# Patient Record
Sex: Male | Born: 1960
Health system: Southern US, Community
[De-identification: ages and names within clinical notes are randomized; demographics above are authoritative.]

## PROBLEM LIST (undated history)

## (undated) DIAGNOSIS — I4891 Unspecified atrial fibrillation: Secondary | ICD-10-CM

## (undated) DIAGNOSIS — I10 Essential (primary) hypertension: Secondary | ICD-10-CM

## (undated) DIAGNOSIS — E7800 Pure hypercholesterolemia, unspecified: Secondary | ICD-10-CM

## (undated) HISTORY — DX: Unspecified atrial fibrillation: I48.91

---

## 2004-03-04 ENCOUNTER — Emergency Department (HOSPITAL_COMMUNITY): Admission: EM | Admit: 2004-03-04 | Discharge: 2004-03-04 | Payer: Self-pay | Admitting: Emergency Medicine

## 2004-06-18 ENCOUNTER — Emergency Department (HOSPITAL_COMMUNITY): Admission: EM | Admit: 2004-06-18 | Discharge: 2004-06-18 | Payer: Self-pay | Admitting: Family Medicine

## 2004-07-11 ENCOUNTER — Emergency Department (HOSPITAL_COMMUNITY): Admission: EM | Admit: 2004-07-11 | Discharge: 2004-07-11 | Payer: Self-pay | Admitting: Family Medicine

## 2004-08-13 ENCOUNTER — Emergency Department (HOSPITAL_COMMUNITY): Admission: EM | Admit: 2004-08-13 | Discharge: 2004-08-13 | Payer: Self-pay | Admitting: Emergency Medicine

## 2008-10-05 ENCOUNTER — Emergency Department (HOSPITAL_COMMUNITY): Admission: EM | Admit: 2008-10-05 | Discharge: 2008-10-05 | Payer: Self-pay | Admitting: Family Medicine

## 2009-01-04 ENCOUNTER — Emergency Department (HOSPITAL_COMMUNITY): Admission: EM | Admit: 2009-01-04 | Discharge: 2009-01-04 | Payer: Self-pay | Admitting: Family Medicine

## 2009-01-16 ENCOUNTER — Ambulatory Visit (HOSPITAL_COMMUNITY): Admission: RE | Admit: 2009-01-16 | Discharge: 2009-01-16 | Payer: Self-pay | Admitting: Internal Medicine

## 2009-01-16 ENCOUNTER — Emergency Department (HOSPITAL_COMMUNITY): Admission: EM | Admit: 2009-01-16 | Discharge: 2009-01-16 | Payer: Self-pay | Admitting: Emergency Medicine

## 2010-03-17 ENCOUNTER — Encounter: Payer: Self-pay | Admitting: Emergency Medicine

## 2010-03-17 ENCOUNTER — Encounter: Payer: Self-pay | Admitting: Family Medicine

## 2010-04-08 ENCOUNTER — Inpatient Hospital Stay (INDEPENDENT_AMBULATORY_CARE_PROVIDER_SITE_OTHER)
Admission: RE | Admit: 2010-04-08 | Discharge: 2010-04-08 | Disposition: A | Discharge status: Home / Self Care | Payer: Self-pay | Source: Ambulatory Visit | Attending: Family Medicine | Admitting: Family Medicine

## 2010-04-08 DIAGNOSIS — B86 Scabies: Secondary | ICD-10-CM

## 2010-04-08 DIAGNOSIS — I10 Essential (primary) hypertension: Secondary | ICD-10-CM

## 2010-05-29 LAB — DIFFERENTIAL
Basophils Absolute: 0 10*3/uL (ref 0.0–0.1)
Basophils Relative: 1 % (ref 0–1)
Eosinophils Absolute: 0.1 10*3/uL (ref 0.0–0.7)
Eosinophils Relative: 2 % (ref 0–5)
Lymphocytes Relative: 38 % (ref 12–46)

## 2010-05-29 LAB — POCT I-STAT, CHEM 8
Hemoglobin: 13.9 g/dL (ref 13.0–17.0)
Sodium: 141 mEq/L (ref 135–145)
TCO2: 26 mmol/L (ref 0–100)

## 2010-05-29 LAB — CBC
HCT: 38.3 % — ABNORMAL LOW (ref 39.0–52.0)
MCV: 80.8 fL (ref 78.0–100.0)
Platelets: 137 10*3/uL — ABNORMAL LOW (ref 150–400)
RDW: 14.9 % (ref 11.5–15.5)

## 2010-05-29 LAB — MAGNESIUM: Magnesium: 2.1 mg/dL (ref 1.5–2.5)

## 2010-05-29 LAB — POCT CARDIAC MARKERS: Myoglobin, poc: 132 ng/mL (ref 12–200)

## 2010-05-29 LAB — BASIC METABOLIC PANEL
BUN: 20 mg/dL (ref 6–23)
CO2: 28 mEq/L (ref 19–32)
Calcium: 9.4 mg/dL (ref 8.4–10.5)
Chloride: 104 mEq/L (ref 96–112)
GFR calc Af Amer: >60 mL/min (ref >60)
GFR calc non Af Amer: >60 mL/min (ref >60)
Potassium: 3.9 mEq/L (ref 3.5–5.1)
Sodium: 138 mEq/L (ref 135–145)

## 2010-05-29 LAB — APTT: aPTT: 28 seconds (ref 24–37)

## 2013-10-12 ENCOUNTER — Emergency Department (INDEPENDENT_AMBULATORY_CARE_PROVIDER_SITE_OTHER)
Admission: EM | Admit: 2013-10-12 | Discharge: 2013-10-12 | Disposition: A | Discharge status: Home / Self Care | Payer: Self-pay | Source: Home / Self Care | Attending: Emergency Medicine | Admitting: Emergency Medicine

## 2013-10-12 ENCOUNTER — Encounter (HOSPITAL_COMMUNITY): Payer: Self-pay | Admitting: Emergency Medicine

## 2013-10-12 DIAGNOSIS — I10 Essential (primary) hypertension: Secondary | ICD-10-CM

## 2013-10-12 HISTORY — DX: Essential (primary) hypertension: I10

## 2013-10-12 LAB — POCT I-STAT, CHEM 8
BUN: 18 mg/dL (ref 6–23)
CALCIUM ION: 1.16 mmol/L (ref 1.12–1.23)
Chloride: 114 mEq/L — ABNORMAL HIGH (ref 96–112)
Creatinine, Ser: 1.4 mg/dL — ABNORMAL HIGH (ref 0.50–1.35)
Glucose, Bld: 94 mg/dL (ref 70–99)
HCT: 45 % (ref 39.0–52.0)
HEMOGLOBIN: 15.3 g/dL (ref 13.0–17.0)
Potassium: 3.8 mEq/L (ref 3.7–5.3)
Sodium: 138 mEq/L (ref 137–147)
TCO2: 23 mmol/L (ref 0–100)

## 2013-10-12 MED ORDER — CHLORTHALIDONE 25 MG PO TABS: 25.0000 mg | ORAL_TABLET | Freq: Every day | ORAL | Status: DC

## 2013-10-12 MED ORDER — CLONIDINE HCL 0.1 MG PO TABS
ORAL_TABLET | ORAL | Status: AC
  Filled 2013-10-12: qty 1

## 2013-10-12 MED ORDER — CLONIDINE HCL 0.1 MG PO TABS
0.1000 mg | ORAL_TABLET | Freq: Once | ORAL | Status: AC
  Administered 2013-10-12: 0.1 mg via ORAL

## 2013-10-12 MED ORDER — AMLODIPINE BESYLATE 10 MG PO TABS: 10.0000 mg | ORAL_TABLET | Freq: Every day | ORAL | Status: DC

## 2013-10-12 MED ORDER — CLONIDINE HCL 0.2 MG PO TABS: 0.2000 mg | ORAL_TABLET | Freq: Two times a day (BID) | ORAL | Status: DC

## 2013-10-12 NOTE — ED Provider Notes (Signed)
Chief Complaint   Chief Complaint  Patient presents with  . Hypertension    History of Present Illness   HASHIM EICHHORST is a 53 year old male with a 4-5 year history of high blood pressure. He was on medication at one time but lost his insurance so he stopped the medication and has not had any followup for several years. 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 He's afraid he will not pass a DOT physical his blood pressure is elevated. He has no symptoms right now including no headaches, blurry vision, dizziness, chest pain, shortness of breath, ankle edema, or strokelike symptoms. He has no history of diabetes, chronic kidney disease, hypercholesterolemia, or cigarette smoking. He has not had a stroke or heart attack.  Review of Systems   Other than as noted above, the patient denies any of the following symptoms: Respiratory:  No coughing, wheezing, or shortness of breath. Cardiac:  No chest pain, tightness, pressure, palpitations, syncope, or edema. Neuro:  No headache, dizziness, blurred vision, weakness, paresthesias, or strokelike symptoms.   PMFSH   Past medical history, family history, social history, meds, and allergies were reviewed.    Physical Examination   Vital signs:  BP 237/113  Pulse 66  Temp(Src) 98.2 F (36.8 C) (Oral) General:  Alert, oriented, in no distress. Lungs:  Breath sounds clear and equal bilaterally.  No wheezes, rales, or rhonchi. Heart:  Regular rhythm, no gallops, murmers, clicks or rubs.  Abdomen:  Soft and flat.  Nontender, no organomegaly or mass.  No pulsatile midline abdominal mass or bruit. Ext:  No edema, pulses full. Neurological exam:  Alert and oriented.  Speech is clear.  No pronator drift.  CNs intact.  Labs   Results for orders placed during the hospital encounter of 10/12/13  POCT I-STAT, CHEM 8      Result Value Ref Range   Sodium 138  137 - 147 mEq/L   Potassium 3.8  3.7 - 5.3 mEq/L   Chloride 114 (*) 96 - 112 mEq/L   BUN 18  6 - 23 mg/dL   Creatinine, Ser 1.61 (*) 0.50 - 1.35 mg/dL   Glucose, Bld 94  70 - 99 mg/dL   Calcium, Ion 0.96  0.45 - 1.23 mmol/L   TCO2 23  0 - 100 mmol/L   Hemoglobin 15.3  13.0 - 17.0 g/dL   HCT 40.9  81.1 - 91.4 %     Course in Urgent Care Center   Given clonidine 0.1 mg by mouth tonight.  Assessment   The encounter diagnosis was Essential hypertension.  He has severe hypertension and will probably need 3 drugs, thus he was started on amlodipine, chlorthalidone, and clonidine. They should be and his price range since he does not have any insurance right now. I suggested he followup here in a week and then try to get in with community health [facility] wellness for his ongoing followup appointments.  Plan   1.  Meds:  The following meds were prescribed:   New Prescriptions   AMLODIPINE (NORVASC) 10 MG TABLET    Take 1 tablet (10 mg total) by mouth daily.   CHLORTHALIDONE (HYGROTON) 25 MG TABLET    Take 1 tablet (25 mg total) by mouth daily.   CLONIDINE (CATAPRES) 0.2 MG TABLET    Take 1 tablet (0.2 mg total) by mouth 2 (two) times daily.    2.  Patient Education/Counseling:  The patient was given appropriate handouts, self care instructions, and instructed in symptomatic relief. Specifically discussed salt and sodium restriction, weight control, and exercise.   3.  Follow up:  The patient was told to follow up here if no better in 3 to 4 days, or sooner if becoming worse in any way, and given some red flag symptoms such as severe headache, vision changes, shortness of breath, chest pain or stroke like symptoms which would prompt immediate return.     Reuben Likesavid C Darrius Montano, MD 10/12/13 [phone]

## 2013-10-12 NOTE — Discharge Instructions (Signed)
Blood pressure over the ideal can put you at higher risk for stroke, heart disease, and kidney failure.  For this reason, it's important to try to get your blood pressure as close as possible to the ideal. ° °The ideal blood pressure is 120/80.  Blood pressures from 120-139 systolic over 80-89 diastolic are labeled as "prehypertension."  This means you are at higher risk of developing hypertension in the future.  Blood pressures in this range are not treated with medication, but lifestyle changes are recommended to prevent progression to hypertension.  Blood pressures of 140 and above systolic over 90 and above diastolic are classified as hypertension and are treated with medications. ° °Lifestyle changes which can benefit both prehypertension and hypertension include the following: ° °· Salt and sodium restriction. °· Weight loss. °· Regular exercise. °· Avoidance of tobacco. °· Avoidance of excess alcohol. °· The "D.A.S.H" diet. ° °· People with hypertension and prehypertension should limit their salt intake to less than 1500 mg daily.  Reading the nutrition information on the label of many prepared foods can give you an idea of how much sodium you're consuming at each meal.  Remember that the most important number on the nutrition information is the serving size.  It may be smaller than you think.  Try to avoid adding extra salt at the table.  You may add small amounts of salt while cooking.  Remember that salt is an acquired taste and you may get used to a using a whole lot less salt than you are using now.  Using less salt lets the food's natural flavors come through.  You might want to consider using salt substitutes, potassium chloride, pepper, or blends of herbs and spices to enhance the flavor of your food.  Foods that contain the most salt include: processed meats (like ham, bacon, lunch meat, sausage, hot dogs, and breakfast meat), chips, pretzels, salted nuts, soups, salty snacks, canned foods, junk  food, fast food, restaurant food, mustard, pickles, pizza, popcorn, soy sauce, and worcestershire sauce--quite a list!  You might ask, "Is there anything I can eat?"  The answer is, "yes."  Fruits and vegetables are usually low in salt.  Fresh is better than frozen which is better than canned.  If you have canned vegetables, you can cut down on the salt content by rinsing them in tap water 3 times before cooking.   ° °· Weight loss is the second thing you can do to lower your blood pressure.  Getting to and maintaining ideal weight will often normalize your blood pressure and allow you to avoid medications, entirely, cut way down on your dosage of medications, or allow to wean off your meds.  (Note, this should only be done under the supervision of your primary care doctor.)  Of course, weight loss takes time and you may need to be on medication in the meantime.  You shoot for a body mass index of 20-25.  When you go to the urgent care or to your primary care doctor, they should calculate your BMI.  If you don't know what it is, ask.  You can calculate your BMI with the following formula:  Weight in pounds x 703/ (height in inches) x (height in inches).  There are many good diets out there: Weight Watchers and the D.A.S.H. Diet are the best, but often, just modifying a few factors can be helpful:  Don't skip meals, don't eat out, and keeping a food diary.  I do not recommend   fad diets or diet pills which often raise blood pressure.  ° °· Everyone should get regular exercise, but this is particularly important for people with high blood pressure.  Just about any exercise is good.  The only exercise which may be harmful is lifting extreme heavy weights.  I recommend moderate exercise such as walking for 30 minutes 5 days a week.  Going to the gym for a 50 minute workout 3 times a week is also good.  This amounts to 150 minutes of exercise weekly. ° °· Anyone with high blood pressure should avoid any use of tobacco.   Tobacco use does not elevate blood pressure, but it increases the risk of heart disease and stroke.  If you are interested in quitting, discuss with your doctor how to quit.  If you are not interested in quitting, ask yourself, "What would my life be like in 10 years if I continue to smoke?"  "How will I know when it is time to quit?"  "How would my life be better if I were to quit." ° °· Excess alcohol intake can raise the blood pressure.  The safe alcohol intake is 2 drinks or less per day for men and 1 drink per day or less for women. ° °· There is a very good diet which I recommend that has been designed for people with blood pressure called the D.A.S.H. Diet (dietary approaches to stop hypertension).  It consists of fruits, vegetables, lean meats, low fat dairy, whole grains, nuts and seeds.  It is very low in salt and sodium.  It has also been found to have other beneficial health effects such as lowering cholesterol and helping lose weight.  It has been developed by the National Institutes of Health and can be downloaded from the internet without any cost. Just do a web search on "D.A.S.H. Diet." or go the NIH website (www.nih.gov).  There are also cookbooks and diet plans that can be gotten from [employer] to help you with this diet. ° °DASH Eating Plan °DASH stands for "Dietary Approaches to Stop Hypertension." The DASH eating plan is a healthy eating plan that has been shown to reduce high blood pressure (hypertension). Additional health benefits may include reducing the risk of type 2 diabetes mellitus, heart disease, and stroke. The DASH eating plan may also help with weight loss. °WHAT DO I NEED TO KNOW ABOUT THE DASH EATING PLAN? °For the DASH eating plan, you will follow these general guidelines: °· Choose foods with a percent daily value for sodium of less than 5% (as listed on the food label). °· Use salt-free seasonings or herbs instead of table salt or sea salt. °· Check with your health care provider  or pharmacist before using salt substitutes. °· Eat lower-sodium products, often labeled as "lower sodium" or "no salt added." °· Eat fresh foods. °· Eat more vegetables, fruits, and low-fat dairy products. °· Choose whole grains. Look for the word "whole" as the first word in the ingredient list. °· Choose fish and skinless chicken or turkey more often than red meat. Limit fish, poultry, and meat to 6 oz (170 g) each day. °· Limit sweets, desserts, sugars, and sugary drinks. °· Choose heart-healthy fats. °· Limit cheese to 1 oz (28 g) per day. °· Eat more home-cooked food and less restaurant, buffet, and fast food. °· Limit fried foods. °· Cook foods using methods other than frying. °· Limit canned vegetables. If you do use them, rinse them well to decrease   the sodium. °· When eating at a restaurant, ask that your food be prepared with less salt, or no salt if possible. °WHAT FOODS CAN I EAT? °Seek help from a dietitian for individual calorie needs. °Grains °Whole grain or whole wheat bread. Brown rice. Whole grain or whole wheat pasta. Quinoa, bulgur, and whole grain cereals. Low-sodium cereals. Corn or whole wheat flour tortillas. Whole grain cornbread. Whole grain crackers. Low-sodium crackers. °Vegetables °Fresh or frozen vegetables (raw, steamed, roasted, or grilled). Low-sodium or reduced-sodium tomato and vegetable juices. Low-sodium or reduced-sodium tomato sauce and paste. Low-sodium or reduced-sodium canned vegetables.  °Fruits °All fresh, canned (in natural juice), or frozen fruits. °Meat and Other Protein Products °Ground beef (85% or leaner), grass-fed beef, or beef trimmed of fat. Skinless chicken or turkey. Ground chicken or turkey. Pork trimmed of fat. All fish and seafood. Eggs. Dried beans, peas, or lentils. Unsalted nuts and seeds. Unsalted canned beans. °Dairy °Low-fat dairy products, such as skim or 1% milk, 2% or reduced-fat cheeses, low-fat ricotta or cottage cheese, or plain low-fat yogurt.  Low-sodium or reduced-sodium cheeses. °Fats and Oils °Tub margarines without trans fats. Light or reduced-fat mayonnaise and salad dressings (reduced sodium). Avocado. Safflower, olive, or canola oils. Natural peanut or almond butter. °Other °Unsalted popcorn and pretzels. °The items listed above may not be a complete list of recommended foods or beverages. Contact your dietitian for more options. °WHAT FOODS ARE NOT RECOMMENDED? °Grains °White bread. White pasta. White rice. Refined cornbread. Bagels and croissants. Crackers that contain trans fat. °Vegetables °Creamed or fried vegetables. Vegetables in a cheese sauce. Regular canned vegetables. Regular canned tomato sauce and paste. Regular tomato and vegetable juices. °Fruits °Dried fruits. Canned fruit in light or heavy syrup. Fruit juice. °Meat and Other Protein Products °Fatty cuts of meat. Ribs, chicken wings, bacon, sausage, bologna, salami, chitterlings, fatback, hot dogs, bratwurst, and packaged luncheon meats. Salted nuts and seeds. Canned beans with salt. °Dairy °Whole or 2% milk, cream, half-and-half, and cream cheese. Whole-fat or sweetened yogurt. Full-fat cheeses or blue cheese. Nondairy creamers and whipped toppings. Processed cheese, cheese spreads, or cheese curds. °Condiments °Onion and garlic salt, seasoned salt, table salt, and sea salt. Canned and packaged gravies. Worcestershire sauce. Tartar sauce. Barbecue sauce. Teriyaki sauce. Soy sauce, including reduced sodium. Steak sauce. Fish sauce. Oyster sauce. Cocktail sauce. Horseradish. Ketchup and mustard. Meat flavorings and tenderizers. Bouillon cubes. Hot sauce. Tabasco sauce. Marinades. Taco seasonings. Relishes. °Fats and Oils °Butter, stick margarine, lard, shortening, ghee, and bacon fat. Coconut, palm kernel, or palm oils. Regular salad dressings. °Other °Pickles and olives. Salted popcorn and pretzels. °The items listed above may not be a complete list of foods and beverages to avoid.  Contact your dietitian for more information. °WHERE CAN I FIND MORE INFORMATION? °National Heart, Lung, and Blood Institute: www.nhlbi.nih.gov/health/health-topics/topics/dash/ °Document Released: 01/30/2011 Document Revised: 06/27/2013 Document Reviewed: 12/15/2012 °ExitCare® Patient Information ©2015 [employer]. This information is not intended to replace advice given to you by your health care provider. Make sure you discuss any questions you have with your health care provider. ° °

## 2013-10-12 NOTE — ED Notes (Signed)
Notified Dr. Lorenz CoasterKeller of BP reading; per Dr. Lorenz CoasterKeller, pt ok to leave

## 2013-10-12 NOTE — ED Notes (Signed)
Dr Lorenz Coasterkeller aware of blood pressure reading documented in record

## 2013-10-12 NOTE — ED Notes (Signed)
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  Patient has been off blood pressure medicine for 2 years , plus.  Denies pain.  Here today because he knows when truck driving school does a physical, the school will deny him until blood pressure addressed

## 2013-11-07 ENCOUNTER — Telehealth: Payer: Self-pay | Admitting: Internal Medicine

## 2013-11-07 NOTE — Telephone Encounter (Signed)
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); please let the patient know if he calls in that NP Luna Glasgow is still willing to establish care with him if he chooses;

## 2013-11-08 ENCOUNTER — Encounter: Payer: Self-pay | Admitting: Internal Medicine

## 2013-11-08 ENCOUNTER — Ambulatory Visit: Payer: Self-pay | Attending: Internal Medicine | Admitting: Internal Medicine

## 2013-11-08 DIAGNOSIS — Z0289 Encounter for other administrative examinations: Secondary | ICD-10-CM | POA: Insufficient documentation

## 2013-11-08 NOTE — Progress Notes (Signed)
Patient ID: James [name], male   DOB: 02-19-1961, 53 y.o.   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  He was called the day before to inform him that we are not certified to perform DOT physicals.  I encouraged patient to stay and establish care and he refused to be seen.

## 2013-11-08 NOTE — Progress Notes (Signed)
Pt is here to establish care. Pt is here for a DOT physical. Pt has a history of HTN. Pt states that the clonidine was making him feel funny so he stopped taking it.

## 2014-09-20 ENCOUNTER — Ambulatory Visit: Payer: Self-pay | Admitting: Internal Medicine

## 2014-09-29 ENCOUNTER — Ambulatory Visit: Payer: Self-pay | Attending: Internal Medicine

## 2014-09-29 ENCOUNTER — Encounter (HOSPITAL_BASED_OUTPATIENT_CLINIC_OR_DEPARTMENT_OTHER): Payer: Self-pay | Admitting: Clinical

## 2014-09-29 DIAGNOSIS — I10 Essential (primary) hypertension: Secondary | ICD-10-CM

## 2014-09-29 NOTE — Progress Notes (Signed)
ASSESSMENT: Pt currently concerned about financial circumstances related to health care needs. He needs to establish care w PCP; would benefit from psychoeducation and supportive counseling regarding coping with symptoms of grief.  Stage of Change: contemplative  PLAN: 1. F/U with behavioral health consultant in as needed 2. Psychiatric Medications: n/a. 3. Behavioral recommendation(s):   -Bring in results of sleep study -Consider reading educational material regarding coping with grief -Consider obtaining [place] bus [place] for reduced fare transportation SUBJECTIVE: Pt. referred by financial counseling for community resources:  Pt. reports the following symptoms/concerns: Pt says he needs a CPAP machine; he has been using his recently-deceased mothers CPAP machine. He is concerned about financial situation. Duration of problem: unknown Severity: mild  OBJECTIVE: Orientation & Cognition: Oriented x3. Thought processes normal and appropriate to situation. Mood: appropriate. Affect: appropriate Appearance: appropriate Risk of harm to self or others: no risk of harm to self or others Substance use: unknown Assessments administered: n/a  Diagnosis: Problem related to psychosocial circumstances CPT Code: Z65.9 -------------------------------------------- Other(s) present in the room: none  Time spent with patient in exam room: 10 minutes

## 2014-10-02 ENCOUNTER — Telehealth: Payer: Self-pay | Admitting: Clinical

## 2014-10-02 NOTE — Telephone Encounter (Signed)
Attempt to let pt know we have not received fax concerning CPAP; left HIPPA-compliant message to return call to [place] at [phone]

## 2014-10-04 ENCOUNTER — Ambulatory Visit: Payer: Self-pay | Attending: Internal Medicine | Admitting: Internal Medicine

## 2014-10-04 ENCOUNTER — Encounter: Payer: Self-pay | Admitting: Internal Medicine

## 2014-10-04 VITALS — BP 128/77 | HR 68 | Temp 98.4°F | Resp 18 | Ht 71.0 in | Wt 300.0 lb

## 2014-10-04 DIAGNOSIS — Z79899 Other long term (current) drug therapy: Secondary | ICD-10-CM | POA: Insufficient documentation

## 2014-10-04 DIAGNOSIS — Z125 Encounter for screening for malignant neoplasm of prostate: Secondary | ICD-10-CM

## 2014-10-04 DIAGNOSIS — R42 Dizziness and giddiness: Secondary | ICD-10-CM

## 2014-10-04 DIAGNOSIS — I10 Essential (primary) hypertension: Secondary | ICD-10-CM

## 2014-10-04 MED ORDER — CHLORTHALIDONE 25 MG PO TABS: 25.0000 mg | ORAL_TABLET | Freq: Every day | ORAL | Status: DC

## 2014-10-04 MED ORDER — AMLODIPINE BESYLATE 10 MG PO TABS: 10.0000 mg | ORAL_TABLET | Freq: Every day | ORAL | Status: DC

## 2014-10-04 MED ORDER — CLONIDINE HCL 0.2 MG PO TABS: 0.2000 mg | ORAL_TABLET | Freq: Two times a day (BID) | ORAL | Status: DC

## 2014-10-04 NOTE — Progress Notes (Signed)
Pt's here for refill of BP medication. Pt's here for Clonidine, Chlorthalidone, Amlodopine refill. Pt declined Tdap today, but is interested in the Colonoscopy exam, suggested through Health maintenance.

## 2014-10-04 NOTE — Progress Notes (Signed)
Patient ID: James [name], male   DOB: Dec 20, 1960, 54 y.o.   MRN: [id number]  [id number]:[id number]  [id number]  DOB - [birth year]  CC:  Chief Complaint  Patient presents with  . Medication Refill  . Hypertension       HPI: James [name] is a 54 y.o. male here today to establish medical care.  Patient has a past medical history of hypertension.  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 He has been taking Amlodipine, Chorthalidone, and Clonidine given to him by the urgent care. He is requesting refills today. He does reports that sometimes he has dizziness when he changes positions to quickly or when he takes all of his medications at the same time. He notes that he was told that he has a right cataract but has not been back to the ophthalmologist due to lack of insurance. He is not a smoker and denies drug use.   No Known Allergies Past Medical History  Diagnosis Date  . Hypertension    Current Outpatient Prescriptions on File Prior to Visit  Medication Sig Dispense Refill  . amLODipine (NORVASC) 10 MG tablet Take 1 tablet (10 mg total) by mouth daily. 30 tablet 3  . chlorthalidone (HYGROTON) 25 MG tablet Take 1 tablet (25 mg total) by mouth daily. 30 tablet 3  . cloNIDine (CATAPRES) 0.2 MG tablet Take 1 tablet (0.2 mg total) by mouth 2 (two) times daily. 60 tablet 3   No current facility-administered medications on file prior to visit.   Family History  Problem Relation Age of Onset  . Diabetes Mother    Social History   Social History  . Marital Status: Legally Separated    Spouse Name: N/A  . Number of Children: N/A  . Years of Education: N/A   Occupational History  . Not on file.   Social History Main Topics  . Smoking status: Never Smoker   . Smokeless tobacco: Not on file  . Alcohol Use: 0.5 oz/week    1 drink(s) per week  . Drug Use: No  . Sexual Activity:  Yes   Other Topics Concern  . Not on file   Social History Narrative  . No narrative on file    Review of Systems  Eyes: Negative for blurred vision.  Cardiovascular: Positive for leg swelling (after driving for several hours). Negative for chest pain and palpitations.  Neurological: Positive for dizziness. Negative for tingling and headaches.  All other systems reviewed and are negative.   Objective:   Filed Vitals:   10/04/14 1404  BP: 128/77  Pulse: 68  Temp: 98.4 F (36.9 C)  Resp: 18    Physical Exam  Constitutional: He is oriented to person, place, and time.  Cardiovascular: Normal rate, regular rhythm and normal heart sounds.   Pulmonary/Chest: Effort normal and breath sounds normal.  Musculoskeletal: He exhibits no edema.  Neurological: He is alert and oriented to person, place, and time.  Skin: Skin is warm and dry.  Psychiatric: He has a normal mood and affect.     Lab Results  Component Value Date   WBC 5.4 01/16/2009   HGB 15.3 10/12/2013   HCT 45.0 10/12/2013   MCV 80.8 01/16/2009   PLT 137* 01/16/2009   Lab Results  Component Value Date   CREATININE 1.40* 10/12/2013   BUN 18 10/12/2013   NA 138 10/12/2013  K 3.8 10/12/2013   CL 114* 10/12/2013   CO2 26 01/16/2009    No results found for: HGBA1C Lipid Panel  No results found for: CHOL, TRIG, HDL, CHOLHDL, VLDL, LDLCALC     Assessment and plan:   Rajesh was seen today for medication refill and hypertension.  Diagnoses and all orders for this visit:  Essential hypertension -     amLODipine (NORVASC) 10 MG tablet; Take 1 tablet (10 mg total) by mouth daily. -     chlorthalidone (HYGROTON) 25 MG tablet; Take 1 tablet (25 mg total) by mouth daily. -     cloNIDine (CATAPRES) 0.2 MG tablet; Take 1 tablet (0.2 mg total) by mouth 2 (two) times daily. 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 Patient will come back for fasting labs and I have stressed that I will put him on a statin if needed. I have asked patient to begin taking a daily 81 mg Aspirin.  Patient reports that he needs 3 normal blood pressures to give to his job so he can start back working. He can come back tomorrow for a BP recheck and again in 1 week.   Dizziness -     CBC; Future Likely from taking several anti-hypertensive's at the same time. I have asked patient to switch positions slowly and take medications 1-2 hours apart to prevent dizziness.   Prostate cancer screening -     PSA; Future   Return in about 1 day (around 10/05/2014) for Stacy-BP check and fasting Lab visit and 3 mo PCP .       James Finland, NP-C [facility] [phone] 10/04/2014, 2:20 PM

## 2014-10-05 ENCOUNTER — Ambulatory Visit: Payer: Self-pay | Attending: Internal Medicine | Admitting: Pharmacist

## 2014-10-05 VITALS — BP 131/86 | HR 54

## 2014-10-05 DIAGNOSIS — I10 Essential (primary) hypertension: Secondary | ICD-10-CM | POA: Insufficient documentation

## 2014-10-05 NOTE — Progress Notes (Signed)
Patient arrives in good spirits.    He presents to the clinic for hypertension evaluation.  Pt works for a [employer] and needs three at goal BP readings before returning to work.  Pt had BP at goal x 1 on 10/04/14.   Patient reports adherence with medications. Pt reports   Current BP Medications include:  Amlodipine 10 m daily, Chlorthalione 25 mg daily, clonidine 0.2 mg BID.   Antihypertensives tried in the past include: n/a   O:   Last 3 Office BP readings: BP Readings from Last 3 Encounters:  10/05/14 131/86  10/04/14 128/77  11/08/13 182/106    BMET    Component Value Date/Time   NA 138 10/12/2013 1847   K 3.8 10/12/2013 1847   CL 114* 10/12/2013 1847   CO2 26 01/16/2009 1455   GLUCOSE 94 10/12/2013 1847   BUN 18 10/12/2013 1847   CREATININE 1.40* 10/12/2013 1847   CALCIUM 9.4 01/16/2009 1455   GFRNONAA >60 01/16/2009 1455   GFRAA  01/16/2009 1455    >60        The eGFR has been calculated using the MDRD equation. This calculation has not been validated in all clinical situations. eGFR's persistently <60 mL/min signify possible Chronic Kidney Disease.    A/P:  History of hypertension currently is controlled on current medications.  Reminded pt to take Aspirin 81 mg daily.  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  Continue current medications.      Results reviewed and written information provided.   F/U Clinic Visit with Dr. Peterson Ao on 10/06/14 for third BP reading and fasting lipid panel.  Total time in face-to-face counseling 20 minutes.  Patient seen with Bennye Alm, PharmD, Pharmacy Resident

## 2014-10-05 NOTE — Patient Instructions (Signed)
Thank you for coming in today.    

## 2014-10-06 ENCOUNTER — Ambulatory Visit: Payer: Self-pay | Attending: Internal Medicine | Admitting: *Deleted

## 2014-10-06 VITALS — BP 125/74 | HR 58 | Ht 71.5 in | Wt 301.4 lb

## 2014-10-06 DIAGNOSIS — I10 Essential (primary) hypertension: Secondary | ICD-10-CM

## 2014-10-06 DIAGNOSIS — Z125 Encounter for screening for malignant neoplasm of prostate: Secondary | ICD-10-CM

## 2014-10-06 DIAGNOSIS — R42 Dizziness and giddiness: Secondary | ICD-10-CM

## 2014-10-06 LAB — COMPLETE METABOLIC PANEL WITH GFR
ALT: 22 U/L (ref 9–46)
AST: 19 U/L (ref 10–35)
Albumin: 4.1 g/dL (ref 3.6–5.1)
Alkaline Phosphatase: 54 U/L (ref 40–115)
BUN: 21 mg/dL (ref 7–25)
CO2: 26 mmol/L (ref 20–31)
CREATININE: 1.41 mg/dL — AB (ref 0.70–1.33)
Calcium: 9.3 mg/dL (ref 8.6–10.3)
Chloride: 99 mmol/L (ref 98–110)
GFR, Est African American: 65 mL/min (ref >=60)
GFR, Est Non African American: 56 mL/min — ABNORMAL LOW (ref >=60)
GLUCOSE: 95 mg/dL (ref 65–99)
POTASSIUM: 3.8 mmol/L (ref 3.5–5.3)
Sodium: 137 mmol/L (ref 135–146)
TOTAL PROTEIN: 7.4 g/dL (ref 6.1–8.1)
Total Bilirubin: 0.9 mg/dL (ref 0.2–1.2)

## 2014-10-06 LAB — CBC
HEMATOCRIT: 42.4 % (ref 39.0–52.0)
HEMOGLOBIN: 13.8 g/dL (ref 13.0–17.0)
MCH: 26.3 pg (ref 26.0–34.0)
MCHC: 32.5 g/dL (ref 30.0–36.0)
MCV: 80.9 fL (ref 78.0–100.0)
MPV: 11.7 fL (ref 8.6–12.4)
PLATELETS: 155 10*3/uL (ref 150–400)
RBC: 5.24 MIL/uL (ref 4.22–5.81)
RDW: 15.5 % (ref 11.5–15.5)
WBC: 4.2 10*3/uL (ref 4.0–10.5)

## 2014-10-06 LAB — LIPID PANEL
CHOL/HDL RATIO: 7.7 ratio — AB (ref <=5.0)
Cholesterol: 223 mg/dL — ABNORMAL HIGH (ref 125–200)
HDL: 29 mg/dL — ABNORMAL LOW (ref >=40)
LDL CALC: 164 mg/dL — AB (ref <130)
Triglycerides: 151 mg/dL — ABNORMAL HIGH (ref <150)
VLDL: 30 mg/dL (ref <30)

## 2014-10-06 MED ORDER — ASPIRIN EC 81 MG PO TBEC: 81.0000 mg | DELAYED_RELEASE_TABLET | Freq: Every day | ORAL | Status: DC

## 2014-10-06 NOTE — Progress Notes (Signed)
Patient presents for 3rd BP for work and fasting lab Med list reviewed; states taking all meds as directed Discussed need for low sodium diet and using Mrs. Dash as alternative to salt Encouraged to choose foods with 5% or less of daily value for sodium. Walking 2-3 miles per day for exercise Patient denies headaches, blurred vision, chest pain  Positive for [facility] since gaining weight States making better food choices: lean [place] and lean chicken, cheerios with 1 % milk Stopped all soda; drinking water and green tea Discussed not skipping mealsas this will not help with weight loss and may hinder it.  Filed Vitals:   10/06/14 1213  BP: 125/74  Pulse: 58    Patient advised to call for med refills at least 7 days before running out so as not to go without.  Patient aware that he is to f/u with PCP 3 months from last visit (Due 01/04/15)  Patient given literature on DASH Eating Plan

## 2014-10-06 NOTE — Patient Instructions (Signed)
DASH Eating Plan °DASH stands for "Dietary Approaches to Stop Hypertension." The DASH eating plan is a healthy eating plan that has been shown to reduce high blood pressure (hypertension). Additional health benefits may include reducing the risk of type 2 diabetes mellitus, heart disease, and stroke. The DASH eating plan may also help with weight loss. °WHAT DO I NEED TO KNOW ABOUT THE DASH EATING PLAN? °For the DASH eating plan, you will follow these general guidelines: °· Choose foods with a percent daily value for sodium of less than 5% (as listed on the food label). °· Use salt-free seasonings or herbs instead of table salt or sea salt. °· Check with your health care provider or pharmacist before using salt substitutes. °· Eat lower-sodium products, often labeled as "lower sodium" or "no salt added." °· Eat fresh foods. °· Eat more vegetables, fruits, and low-fat dairy products. °· Choose whole grains. Look for the word "whole" as the first word in the ingredient list. °· Choose fish and skinless chicken or turkey more often than red meat. Limit fish, poultry, and meat to 6 oz (170 g) each day. °· Limit sweets, desserts, sugars, and sugary drinks. °· Choose heart-healthy fats. °· Limit cheese to 1 oz (28 g) per day. °· Eat more home-cooked food and less restaurant, buffet, and fast food. °· Limit fried foods. °· Cook foods using methods other than frying. °· Limit canned vegetables. If you do use them, rinse them well to decrease the sodium. °· When eating at a restaurant, ask that your food be prepared with less salt, or no salt if possible. °WHAT FOODS CAN I EAT? °Seek help from a dietitian for individual calorie needs. °Grains °Whole grain or whole wheat bread. Brown rice. Whole grain or whole wheat pasta. Quinoa, bulgur, and whole grain cereals. Low-sodium cereals. Corn or whole wheat flour tortillas. Whole grain cornbread. Whole grain crackers. Low-sodium crackers. °Vegetables °Fresh or frozen vegetables  (raw, steamed, roasted, or grilled). Low-sodium or reduced-sodium tomato and vegetable juices. Low-sodium or reduced-sodium tomato sauce and paste. Low-sodium or reduced-sodium canned vegetables.  °Fruits °All fresh, canned (in natural juice), or frozen fruits. °Meat and Other Protein Products °Ground beef (85% or leaner), grass-fed beef, or beef trimmed of fat. Skinless chicken or turkey. Ground chicken or turkey. Pork trimmed of fat. All fish and seafood. Eggs. Dried beans, peas, or lentils. Unsalted nuts and seeds. Unsalted canned beans. °Dairy °Low-fat dairy products, such as skim or 1% milk, 2% or reduced-fat cheeses, low-fat ricotta or cottage cheese, or plain low-fat yogurt. Low-sodium or reduced-sodium cheeses. °Fats and Oils °Tub margarines without trans fats. Light or reduced-fat mayonnaise and salad dressings (reduced sodium). Avocado. Safflower, olive, or canola oils. Natural peanut or almond butter. °Other °Unsalted popcorn and pretzels. °The items listed above may not be a complete list of recommended foods or beverages. Contact your dietitian for more options. °WHAT FOODS ARE NOT RECOMMENDED? °Grains °White bread. White pasta. White rice. Refined cornbread. Bagels and croissants. Crackers that contain trans fat. °Vegetables °Creamed or fried vegetables. Vegetables in a cheese sauce. Regular canned vegetables. Regular canned tomato sauce and paste. Regular tomato and vegetable juices. °Fruits °Dried fruits. Canned fruit in light or heavy syrup. Fruit juice. °Meat and Other Protein Products °Fatty cuts of meat. Ribs, chicken wings, bacon, sausage, bologna, salami, chitterlings, fatback, hot dogs, bratwurst, and packaged luncheon meats. Salted nuts and seeds. Canned beans with salt. °Dairy °Whole or 2% milk, cream, half-and-half, and cream cheese. Whole-fat or sweetened yogurt. Full-fat   cheeses or blue cheese. Nondairy creamers and whipped toppings. Processed cheese, cheese spreads, or cheese  curds. °Condiments °Onion and garlic salt, seasoned salt, table salt, and sea salt. Canned and packaged gravies. Worcestershire sauce. Tartar sauce. Barbecue sauce. Teriyaki sauce. Soy sauce, including reduced sodium. Steak sauce. Fish sauce. Oyster sauce. Cocktail sauce. Horseradish. Ketchup and mustard. Meat flavorings and tenderizers. Bouillon cubes. Hot sauce. Tabasco sauce. Marinades. Taco seasonings. Relishes. °Fats and Oils °Butter, stick margarine, lard, shortening, ghee, and bacon fat. Coconut, palm kernel, or palm oils. Regular salad dressings. °Other °Pickles and olives. Salted popcorn and pretzels. °The items listed above may not be a complete list of foods and beverages to avoid. Contact your dietitian for more information. °WHERE CAN I FIND MORE INFORMATION? °National Heart, Lung, and Blood Institute: www.nhlbi.nih.gov/health/health-topics/topics/dash/ °Document Released: 01/30/2011 Document Revised: 06/27/2013 Document Reviewed: 12/15/2012 °ExitCare® Patient Information ©2015 [employer]. This information is not intended to replace advice given to you by your health care provider. Make sure you discuss any questions you have with your health care provider. ° °

## 2014-10-07 LAB — PSA: PSA: 0.46 ng/mL (ref <=4.00)

## 2014-10-10 ENCOUNTER — Telehealth: Payer: Self-pay | Admitting: Clinical

## 2014-10-10 NOTE — Telephone Encounter (Signed)
Left HIPPA-compliant message to return call to Avante Carneiro from [facility] at [phone].  

## 2014-10-11 ENCOUNTER — Telehealth: Payer: Self-pay | Admitting: Internal Medicine

## 2014-10-11 ENCOUNTER — Telehealth: Payer: Self-pay | Admitting: Clinical

## 2014-10-11 NOTE — Telephone Encounter (Signed)
Patient called requesting to speak to social worker regarding cpap machine, please f/u with patient st [phone]

## 2014-10-11 NOTE — Telephone Encounter (Signed)
Pt wants to know if we have received information from a previous sleep study, so that he may be referred for a CPAP machine through the [employer]. Pt informed by [facility] that we have received his sleep study results, but that he will have to have an appointment with his PCP prior to obtaining the referral. Pt aware that he will be notified when an appointment time becomes available; his PCP and nurse will look over the sleep study results to first determine if there is anything else he needs to do prior to next visit to [facility].

## 2014-10-13 ENCOUNTER — Other Ambulatory Visit: Payer: Self-pay | Admitting: Internal Medicine

## 2014-10-13 ENCOUNTER — Telehealth: Payer: Self-pay | Admitting: *Deleted

## 2014-10-13 NOTE — Telephone Encounter (Signed)
Called patient per [name] And verified his birthdate. Spoke to patient to let him know that as soon as he gets the hospital discount that she asked him to apply for she can send a referral to Pulmonology. Pulmonology will see him and have to write CPAP settings so that she can put on form to get him a machine. He can not get machine until she has all information from Pulm.  Patient stated James [name] told him he needed to get one more tax form to her before his discount could be processed.  He agreed to get the form to [place] and to let [name] know once he had his discount in place.

## 2014-10-27 ENCOUNTER — Telehealth: Payer: Self-pay

## 2014-10-27 ENCOUNTER — Telehealth: Payer: Self-pay | Admitting: Internal Medicine

## 2014-10-27 DIAGNOSIS — I10 Essential (primary) hypertension: Secondary | ICD-10-CM

## 2014-10-27 DIAGNOSIS — E7800 Pure hypercholesterolemia, unspecified: Secondary | ICD-10-CM

## 2014-10-27 MED ORDER — CLONIDINE HCL 0.2 MG PO TABS: 0.2000 mg | ORAL_TABLET | Freq: Two times a day (BID) | ORAL | Status: DC

## 2014-10-27 MED ORDER — AMLODIPINE BESYLATE 10 MG PO TABS: 10.0000 mg | ORAL_TABLET | Freq: Every day | ORAL | Status: DC

## 2014-10-27 MED ORDER — ASPIRIN EC 81 MG PO TBEC: 81.0000 mg | DELAYED_RELEASE_TABLET | Freq: Every day | ORAL | Status: DC

## 2014-10-27 MED ORDER — CHLORTHALIDONE 25 MG PO TABS: 25.0000 mg | ORAL_TABLET | Freq: Every day | ORAL | Status: DC

## 2014-10-27 MED ORDER — ATORVASTATIN CALCIUM 20 MG PO TABS: 20.0000 mg | ORAL_TABLET | Freq: Every day | ORAL | Status: DC

## 2014-10-27 NOTE — Telephone Encounter (Signed)
Patient returned call to nurse, verified date of birth. Patient requesting refills on all medications, to be sent to [facility] on [address]. Patient aware of providers recommendations to continue taking aspirin  daily. Patient voices understanding and has no further questions at this time.

## 2014-10-27 NOTE — Telephone Encounter (Signed)
-----   Message from Ambrose Finland, NP sent at 10/15/2014  7:54 PM EDT ----- Cholesterol is really elevated. Please go over things that increase cholesterol levels such as breads pasta, rice, butters, fried foods, etc. I have prescribed her Lipitor 20 mg to take every evening with dinner. Please explain that high cholesterol places him at risk for stroke and heart disease. Kidney's are still decreasing, likely as a result of his hypertension. It is going to very important that he keeps his BP under control to prevent further damage. No NSAID's---please give examples. Please ask pharmacist if ok before taking any OTC medications. Tylenol only for pain if needed.

## 2014-10-27 NOTE — Telephone Encounter (Signed)
Patient called, returning nurse's call, please f/u

## 2014-10-27 NOTE — Telephone Encounter (Signed)
Aspirin is ok

## 2014-10-27 NOTE — Telephone Encounter (Signed)
Nurse called patient, patient verified date of birth. Patient is aware of elevated cholesterol. Patient aware of breads, pasta, rice, butters, and fried foods will raise cholesterol and agrees to limit these foods.  Patient aware of high cholesterol placing him at risk for stroke and heart disease. Patient aware of kidney's still decreasing, likely as a result of HTN and agrees to keep BP under control to prevent further damage to kidneys. Patient agrees not to take NSAIDS, ibuprofen, aleve. Patient questions should he still take aspirin? Patient will ask pharmacist before taking any OTC medications and to only take Tylenol for pain.  Patient will return call to nurse to make nurse aware of medications needed to be refilled.

## 2014-10-27 NOTE — Telephone Encounter (Signed)
Patient called requesting to speak to nurse regarding medication atorvastatin (LIPITOR) 20 MG tablet, that was recently prescribed is too expensive, transferred to [facility] pharmacy to see if more affordable here. Please f/u

## 2014-11-27 ENCOUNTER — Telehealth: Payer: Self-pay | Admitting: Internal Medicine

## 2014-11-27 NOTE — Telephone Encounter (Signed)
Patient called stating that he called [facility] on [address] But that the pharmacy has no record of having the medication. Please f/u

## 2014-11-27 NOTE — Telephone Encounter (Signed)
Patient called requesting a med refill on his cholesterol, and blood pressure. Patient stated she will be leaving for about three weeks and he needs his medication. Please f/u with pt.

## 2014-11-28 ENCOUNTER — Telehealth: Payer: Self-pay | Admitting: Internal Medicine

## 2014-11-28 NOTE — Telephone Encounter (Signed)
Patient called, left message on nurses voice mail requesting prescriptions to be refilled. Nurse called patient, left message at number provided by patient, [phone] and left message at number in chart. Patient has refills on all medications. Patient needs to call pharmacy for refills.

## 2014-11-28 NOTE — Telephone Encounter (Signed)
Patient called asking about medication refills. 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 He asks if it is possible for the prescriptions to be transferred to a [facility] close to where he will stop. He does not yet know where, but he would like to speak with Herbert Seta about that option and being able to use the discount he receives here. Please f/u with patient ASAP.

## 2014-12-05 ENCOUNTER — Telehealth: Payer: Self-pay

## 2014-12-05 DIAGNOSIS — E7800 Pure hypercholesterolemia, unspecified: Secondary | ICD-10-CM

## 2014-12-05 MED ORDER — ATORVASTATIN CALCIUM 20 MG PO TABS: 20.0000 mg | ORAL_TABLET | Freq: Every day | ORAL | Status: DC

## 2014-12-05 NOTE — Telephone Encounter (Signed)
Nurse called patient, patient verified date of birth. Patient reports [facility] telling patient he has no refills on medications. [facility]. [facility] and [facility]ills sent 10/27/14, they were put on hold because patient did not pick them up. Patient requested atorvastatin to be sent to [facility] because it is cheaper. Patient agrees to pick up other prescriptions at any [facility] with a pharmacy per pharmacist.  Patient requesting dental referral to be placed. Has a wisdom tooth causing pain. Nurse will send message to provider for referral.

## 2014-12-05 NOTE — Telephone Encounter (Signed)
It does not look like he has the orange card. You may alert him of this. If he does please place referral. If not you can mail him a letter with low cost dental options

## 2014-12-05 NOTE — Telephone Encounter (Signed)
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 Nurse called mobile number, reached voicemail. Left message for patient to call Jaylee Lantry with [facility], at [phone]. [facility] [phone], number left on voice mail, left message for patient to call Romulo Okray with [facility], at [phone].

## 2014-12-11 ENCOUNTER — Telehealth: Payer: Self-pay

## 2014-12-11 NOTE — Telephone Encounter (Signed)
Pt was given information to contact Dr.Civils, and was informed that we can not call in a rx for pain medication. He would need to be given a paper copy. Pt. Understood and plans on coming this week once he returns to town.

## 2014-12-11 NOTE — Telephone Encounter (Signed)
Pt. Called requesting a prescription for his tooth ache. Pt. Stated he has been taking ibuprofen and it's not helping at all. 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 Pt. Would like for the prescription to be sent at [facility]. Please f/u with pt. ASAP. °

## 2014-12-11 NOTE — Telephone Encounter (Signed)
Pt. Called requesting a prescription for his tooth ache. Pt. Stated he has been taking ibuprofen and it's not helping at all. 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 [facility] Please f/u with pt. ASAP.

## 2014-12-11 NOTE — Telephone Encounter (Signed)
Patient is on the road and requesting medication for pain For a toothache Patient will not be returning until this coming Friday Is there anything other than ibuprofen he can take

## 2014-12-11 NOTE — Telephone Encounter (Signed)
Pt. Called requesting top speak to nurse regarding his tooth ache. Pt. Stated he would like to get refferal to a dentist. Pt. Is going to come home on Friday and he would like to see the dentist that same day. Please f/u with pt.

## 2014-12-21 NOTE — Telephone Encounter (Signed)
Nurse called patient, reached voicemail. Left message for patient to call [facility]ducci [facility], at [phone]. Left message on voice mail at both numbers provided.

## 2014-12-25 NOTE — Telephone Encounter (Signed)
Nurse called patient on mobile, reached voicemail. Left message for patient to call [facility]beck [facility], at [phone]. 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

## 2015-02-14 ENCOUNTER — Telehealth: Payer: Self-pay | Admitting: Clinical

## 2015-02-14 NOTE — Telephone Encounter (Signed)
Attempt to follow-up with Mr. James [name] concerning making an appointment for referral to obtain CPAP machine; left HIPPA-compliant message to return call to Asher MuirJamie at [facility] at [phone].

## 2015-07-10 MED FILL — ?ATORVASTATIN 20 MG TABLET: 20 | 30 days supply | Qty: 30 | Fill #0 | Status: TO

## 2015-11-02 ENCOUNTER — Telehealth: Payer: Self-pay | Admitting: Internal Medicine

## 2015-11-02 ENCOUNTER — Other Ambulatory Visit: Payer: Self-pay | Admitting: Pharmacist

## 2015-11-02 DIAGNOSIS — I10 Essential (primary) hypertension: Secondary | ICD-10-CM

## 2015-11-02 MED ORDER — AMLODIPINE BESYLATE 10 MG PO TABS: 10.0000 mg | ORAL_TABLET | Freq: Every day | ORAL | 0 refills | Status: DC

## 2015-11-02 MED ORDER — CHLORTHALIDONE 25 MG PO TABS
25.0000 mg | ORAL_TABLET | Freq: Every day | ORAL | 0 refills | Status: DC
Start: 2015-11-02 — End: 2018-02-10

## 2015-11-02 NOTE — Telephone Encounter (Signed)
Medication Refill: chlorthalidone (HYGROTON) 25 MG tablet [[id number]][[id number]]  amLODipine (NORVASC) 10 MG tablet [[id number]][[id number]]  Pt was informed that he needs to make an appointment to establish care with a new provider

## 2015-11-02 NOTE — Telephone Encounter (Signed)
Refilled x 30 days - patient must have office visit for any further refills. 

## 2015-11-14 MED FILL — AMLODIPINE BESYLATE 10 MG T: 10 | 30 days supply | Qty: 30 | Fill #0

## 2015-11-14 MED FILL — ?CHLORTHALIDONE 25 MG TABLE: 25 | 30 days supply | Qty: 30 | Fill #0

## 2017-03-09 ENCOUNTER — Ambulatory Visit: Payer: Self-pay | Admitting: Nurse Practitioner

## 2017-04-20 ENCOUNTER — Ambulatory Visit (HOSPITAL_COMMUNITY)
Admission: EM | Admit: 2017-04-20 | Discharge: 2017-04-20 | Disposition: A | Discharge status: Home / Self Care | Payer: 59

## 2018-02-10 ENCOUNTER — Encounter (HOSPITAL_COMMUNITY): Payer: Self-pay | Admitting: Emergency Medicine

## 2018-02-10 ENCOUNTER — Ambulatory Visit (HOSPITAL_COMMUNITY)
Admission: EM | Admit: 2018-02-10 | Discharge: 2018-02-10 | Disposition: A | Discharge status: Home / Self Care | Payer: 59 | Attending: Family Medicine | Admitting: Family Medicine

## 2018-02-10 DIAGNOSIS — I10 Essential (primary) hypertension: Secondary | ICD-10-CM

## 2018-02-10 DIAGNOSIS — E7800 Pure hypercholesterolemia, unspecified: Secondary | ICD-10-CM | POA: Insufficient documentation

## 2018-02-10 HISTORY — DX: Pure hypercholesterolemia, unspecified: E78.00

## 2018-02-10 MED ORDER — CHLORTHALIDONE 25 MG PO TABS: 25.0000 mg | ORAL_TABLET | Freq: Every day | ORAL | 0 refills | Status: DC

## 2018-02-10 MED ORDER — ATORVASTATIN CALCIUM 20 MG PO TABS: 20.0000 mg | ORAL_TABLET | Freq: Every day | ORAL | 3 refills | Status: DC

## 2018-02-10 MED ORDER — AMLODIPINE BESYLATE 10 MG PO TABS: 10.0000 mg | ORAL_TABLET | Freq: Every day | ORAL | 3 refills | Status: DC

## 2018-02-10 MED ORDER — CLONIDINE HCL 0.2 MG PO TABS: 0.2000 mg | ORAL_TABLET | Freq: Two times a day (BID) | ORAL | 3 refills | Status: DC

## 2018-02-10 NOTE — ED Provider Notes (Signed)
[facility]    CSN: [id number] Arrival date & time: 02/10/18  1540     History   Chief Complaint Chief Complaint  Patient presents with  . Hypertension  . Medication Refill    HPI ZAKERY NORMINGTON is a 57 y.o. male.   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  He is also got hyperlipidemia.  [facility] but they were unable to see him.  Patient has been treated for high blood pressure for over 10 years.  He has no headache or chest pain.  He has no peripheral edema.  His brother recently died of heart disease.  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     Past Medical History:  Diagnosis Date  . Hypercholesteremia   . Hypertension     Patient Active Problem List   Diagnosis Date Noted  . HTN (hypertension) 10/04/2014    History reviewed. No pertinent surgical history.     Home Medications    Prior to Admission medications   Medication Sig Start Date End Date Taking? Authorizing Provider  amLODipine (NORVASC) 10 MG tablet Take 1 tablet (10 mg total) by mouth daily. 02/10/18   Elvina Sidle, MD  aspirin EC 81 MG tablet Take 1 tablet (81 mg total) by mouth daily. 10/27/14   Ambrose Finland, NP  atorvastatin (LIPITOR) 20 MG tablet Take 1 tablet (20 mg total) by mouth daily. 02/10/18   Elvina Sidle, MD  chlorthalidone (HYGROTON) 25 MG tablet Take 1 tablet (25 mg total) by mouth daily. 02/10/18   Elvina Sidle, MD  cloNIDine (CATAPRES) 0.2 MG tablet Take 1 tablet (0.2 mg total) by mouth 2 (two) times daily. 02/10/18   Elvina Sidle, MD    Family History Family History  Problem Relation Age of Onset  . Diabetes Mother     Social History Social History   Tobacco Use  . Smoking status: Never Smoker  Substance Use Topics  . Alcohol use: Yes    Alcohol/week: 1.0 standard drinks    Types: 1  drink(s) per week  . Drug use: No     Allergies   Patient has no known allergies.   Review of Systems Review of Systems   Physical Exam Triage Vital Signs ED Triage Vitals  Enc Vitals Group     BP 02/10/18 1630 (!) 161/142     Pulse Rate 02/10/18 1630 62     Resp 02/10/18 1630 16     Temp 02/10/18 1630 97.8 F (36.6 C)     Temp Source 02/10/18 1630 Oral     SpO2 02/10/18 1630 96 %     Weight --      Height --      Head Circumference --      Peak Flow --      Pain Score 02/10/18 1627 0     Pain Loc --      Pain Edu? --      Excl. in GC? --    No data found.  Updated Vital Signs BP (!) 161/142 (BP Location: Right Arm)   Pulse 62   Temp 97.8 F (36.6 C) (Oral)   Resp 16   SpO2 96%    Physical Exam Vitals signs and nursing note reviewed.  Constitutional:      Appearance: Normal appearance.  HENT:     Head: Normocephalic.  Right Ear: External ear normal.     Left Ear: External ear normal.     Mouth/Throat:     Mouth: Mucous membranes are moist.  Eyes:     Conjunctiva/sclera: Conjunctivae normal.  Neck:     Musculoskeletal: Normal range of motion and neck supple.  Cardiovascular:     Rate and Rhythm: Normal rate.     Heart sounds: Normal heart sounds.  Pulmonary:     Effort: Pulmonary effort is normal.     Breath sounds: Normal breath sounds.  Musculoskeletal: Normal range of motion.        General: No swelling.  Skin:    General: Skin is warm and dry.  Neurological:     General: No focal deficit present.     Mental Status: He is alert.  Psychiatric:        Mood and Affect: Mood normal.      UC Treatments / Results  Labs (all labs ordered are listed, but only abnormal results are displayed) Labs Reviewed - No data to display  EKG None  Radiology No results found.  Procedures Procedures (including critical care time)  Medications Ordered in UC Medications - No data to display  Initial Impression / Assessment and Plan / UC  Course  I have reviewed the triage vital signs and the nursing notes.  Pertinent labs & imaging results that were available during my care of the patient were reviewed by me and considered in my medical decision making (see chart for details).    Final Clinical Impressions(s) / UC Diagnoses   Final diagnoses:  Essential hypertension   Discharge Instructions   None    ED Prescriptions    Medication Sig Dispense Auth. Provider   amLODipine (NORVASC) 10 MG tablet Take 1 tablet (10 mg total) by mouth daily. 90 tablet Elvina SidleLauenstein, Zacharie Portner, MD   atorvastatin (LIPITOR) 20 MG tablet Take 1 tablet (20 mg total) by mouth daily. 90 tablet Elvina SidleLauenstein, Maryetta Shafer, MD   chlorthalidone (HYGROTON) 25 MG tablet Take 1 tablet (25 mg total) by mouth daily. 90 tablet Elvina SidleLauenstein, Nakyla Bracco, MD   cloNIDine (CATAPRES) 0.2 MG tablet Take 1 tablet (0.2 mg total) by mouth 2 (two) times daily. 180 tablet Elvina SidleLauenstein, Mayan Dolney, MD     Controlled Substance Prescriptions [place] Controlled Substance Registry consulted? Not Applicable   Elvina SidleLauenstein, Kaicen Desena, MD 02/10/18 [phone]

## 2018-02-10 NOTE — ED Triage Notes (Signed)
Pt here for medication refill on htn and hypercholesterol meds

## 2018-02-11 ENCOUNTER — Other Ambulatory Visit: Payer: Self-pay

## 2018-02-11 ENCOUNTER — Observation Stay (HOSPITAL_COMMUNITY)
Admission: EM | Admit: 2018-02-11 | Discharge: 2018-02-14 | Disposition: A | Discharge status: Home / Self Care | Payer: 59 | Attending: Internal Medicine | Admitting: Internal Medicine

## 2018-02-11 ENCOUNTER — Ambulatory Visit (INDEPENDENT_AMBULATORY_CARE_PROVIDER_SITE_OTHER)
Admission: EM | Admit: 2018-02-11 | Discharge: 2018-02-11 | Disposition: A | Discharge status: Home / Self Care | Payer: 59 | Source: Home / Self Care

## 2018-02-11 ENCOUNTER — Emergency Department (HOSPITAL_COMMUNITY): Payer: 59

## 2018-02-11 ENCOUNTER — Ambulatory Visit: Payer: Self-pay

## 2018-02-11 ENCOUNTER — Encounter (HOSPITAL_COMMUNITY): Payer: Self-pay

## 2018-02-11 DIAGNOSIS — J90 Pleural effusion, not elsewhere classified: Secondary | ICD-10-CM | POA: Diagnosis not present

## 2018-02-11 DIAGNOSIS — R7989 Other specified abnormal findings of blood chemistry: Secondary | ICD-10-CM | POA: Diagnosis present

## 2018-02-11 DIAGNOSIS — Z79899 Other long term (current) drug therapy: Secondary | ICD-10-CM | POA: Diagnosis not present

## 2018-02-11 DIAGNOSIS — J9601 Acute respiratory failure with hypoxia: Secondary | ICD-10-CM | POA: Diagnosis present

## 2018-02-11 DIAGNOSIS — I4891 Unspecified atrial fibrillation: Secondary | ICD-10-CM | POA: Diagnosis not present

## 2018-02-11 DIAGNOSIS — I482 Chronic atrial fibrillation, unspecified: Secondary | ICD-10-CM | POA: Diagnosis present

## 2018-02-11 DIAGNOSIS — I130 Hypertensive heart and chronic kidney disease with heart failure and stage 1 through stage 4 chronic kidney disease, or unspecified chronic kidney disease: Secondary | ICD-10-CM | POA: Diagnosis not present

## 2018-02-11 DIAGNOSIS — J810 Acute pulmonary edema: Secondary | ICD-10-CM | POA: Diagnosis not present

## 2018-02-11 DIAGNOSIS — E7800 Pure hypercholesterolemia, unspecified: Secondary | ICD-10-CM | POA: Diagnosis not present

## 2018-02-11 DIAGNOSIS — Z7982 Long term (current) use of aspirin: Secondary | ICD-10-CM | POA: Insufficient documentation

## 2018-02-11 DIAGNOSIS — R0602 Shortness of breath: Secondary | ICD-10-CM | POA: Diagnosis not present

## 2018-02-11 DIAGNOSIS — I161 Hypertensive emergency: Secondary | ICD-10-CM

## 2018-02-11 DIAGNOSIS — I2510 Atherosclerotic heart disease of native coronary artery without angina pectoris: Secondary | ICD-10-CM | POA: Insufficient documentation

## 2018-02-11 DIAGNOSIS — I21A1 Myocardial infarction type 2: Secondary | ICD-10-CM | POA: Diagnosis not present

## 2018-02-11 DIAGNOSIS — I051 Rheumatic mitral insufficiency: Secondary | ICD-10-CM | POA: Diagnosis not present

## 2018-02-11 DIAGNOSIS — Z8249 Family history of ischemic heart disease and other diseases of the circulatory system: Secondary | ICD-10-CM | POA: Diagnosis not present

## 2018-02-11 DIAGNOSIS — I420 Dilated cardiomyopathy: Secondary | ICD-10-CM | POA: Insufficient documentation

## 2018-02-11 DIAGNOSIS — I10 Essential (primary) hypertension: Secondary | ICD-10-CM

## 2018-02-11 DIAGNOSIS — N183 Chronic kidney disease, stage 3 (moderate): Secondary | ICD-10-CM | POA: Insufficient documentation

## 2018-02-11 DIAGNOSIS — I160 Hypertensive urgency: Secondary | ICD-10-CM | POA: Diagnosis present

## 2018-02-11 DIAGNOSIS — R778 Other specified abnormalities of plasma proteins: Secondary | ICD-10-CM | POA: Diagnosis present

## 2018-02-11 LAB — CBC
HCT: 42.9 % (ref 39.0–52.0)
Hemoglobin: 13.6 g/dL (ref 13.0–17.0)
MCH: 26.7 pg (ref 26.0–34.0)
MCHC: 31.7 g/dL (ref 30.0–36.0)
MCV: 84.3 fL (ref 80.0–100.0)
Platelets: 151 10*3/uL (ref 150–400)
RBC: 5.09 MIL/uL (ref 4.22–5.81)
RDW: 15.7 % — ABNORMAL HIGH (ref 11.5–15.5)
WBC: 6.5 10*3/uL (ref 4.0–10.5)
nRBC: 0 % (ref 0.0–0.2)

## 2018-02-11 LAB — BASIC METABOLIC PANEL
Anion gap: 11 (ref 5–15)
BUN: 17 mg/dL (ref 6–20)
CO2: 22 mmol/L (ref 22–32)
Calcium: 9.5 mg/dL (ref 8.9–10.3)
Chloride: 106 mmol/L (ref 98–111)
Creatinine, Ser: 1.23 mg/dL (ref 0.61–1.24)
GFR calc Af Amer: >60 mL/min (ref >60)
GFR calc non Af Amer: >60 mL/min (ref >60)
Glucose, Bld: 94 mg/dL (ref 70–99)
Potassium: 3.8 mmol/L (ref 3.5–5.1)
SODIUM: 139 mmol/L (ref 135–145)

## 2018-02-11 LAB — MAGNESIUM: Magnesium: 1.8 mg/dL (ref 1.7–2.4)

## 2018-02-11 LAB — TROPONIN I: Troponin I: 0.06 ng/mL (ref <0.03)

## 2018-02-11 MED ORDER — HEPARIN (PORCINE) 25000 UT/250ML-% IV SOLN
1600.0000 [IU]/h | INTRAVENOUS | Status: DC
  Administered 2018-02-12 (×2): 1600 [IU]/h via INTRAVENOUS
  Filled 2018-02-11 (×2): qty 250

## 2018-02-11 MED ORDER — APIXABAN 5 MG PO TABS
5.0000 mg | ORAL_TABLET | Freq: Two times a day (BID) | ORAL | Status: DC
  Filled 2018-02-11: qty 1

## 2018-02-11 MED ORDER — FUROSEMIDE 10 MG/ML IJ SOLN
40.0000 mg | Freq: Once | INTRAMUSCULAR | Status: AC
  Administered 2018-02-11: 40 mg via INTRAVENOUS
  Filled 2018-02-11: qty 4

## 2018-02-11 MED ORDER — HYDRALAZINE HCL 20 MG/ML IJ SOLN
10.0000 mg | Freq: Once | INTRAMUSCULAR | Status: AC
  Administered 2018-02-11: 10 mg via INTRAVENOUS
  Filled 2018-02-11: qty 1

## 2018-02-11 MED ORDER — HEPARIN BOLUS VIA INFUSION
4000.0000 [IU] | Freq: Once | INTRAVENOUS | Status: AC
  Administered 2018-02-12: 4000 [IU] via INTRAVENOUS
  Filled 2018-02-11: qty 4000

## 2018-02-11 NOTE — Discharge Instructions (Addendum)
You need to go down to the emergency department right now.  You have atrial fibrillation.  You cannot go back to work until we get the electrical problem straightened out with your heart.

## 2018-02-11 NOTE — Progress Notes (Signed)
ANTICOAGULATION CONSULT NOTE - Initial Consult  Pharmacy Consult for apixaban Indication: atrial fibrillation  No Known Allergies  Patient Measurements: Height: 5\' 11"  (180.3 cm) Weight: 275 lb (124.7 kg) IBW/kg (Calculated) : 75.3  Vital Signs: Temp: 98.2 F (36.8 C) (12/19 1824) Temp Source: Oral (12/19 1824) BP: 162/109 (12/19 2130) Pulse Rate: 79 (12/19 2130)  Labs: Recent Labs    02/11/18 1842 02/11/18 2103  HGB 13.6  --   HCT 42.9  --   PLT 151  --   CREATININE 1.23  --   TROPONINI  --  0.06*    Estimated Creatinine Clearance: 88.1 mL/min (by C-G formula based on SCr of 1.23 mg/dL).   Medical History: Past Medical History:  Diagnosis Date  . Hypercholesteremia   . Hypertension    Assessment: [age] yom presented to the ED with new onset afib. To start apixaban. Baseline CBC is WNL. SCr is WNL.  Goal of Therapy:  Stroke prevention Monitor platelets by anticoagulation protocol: Yes   Plan:  Apixaban 5mg  PO BID F/u renal fxn, S&S of bleeding  Ayanna Gheen, Drake Leachachel Lynn 02/11/2018,10:31 PM

## 2018-02-11 NOTE — ED Provider Notes (Signed)
[facility]    CSN: [id number] Arrival date & time: 02/11/18  1745     History   Chief Complaint No chief complaint on file.   HPI James [name] is a 57 y.o. male.   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  At that time, he was running a very high blood pressure and has not taken a medication in months.  Following resumption of his blood pressure medicine last night, patient felt somewhat short of breath and weak with some palpitations.  Patient came in this evening requesting a work note because he did not go to work today, feeling short of breath with some tightness in his chest and week.  He has had no fever and no increased swelling.     Past Medical History:  Diagnosis Date  . Hypercholesteremia   . Hypertension     Patient Active Problem List   Diagnosis Date Noted  . HTN (hypertension) 10/04/2014    No past surgical history on file.     Home Medications    Prior to Admission medications   Medication Sig Start Date End Date Taking? Authorizing Provider  amLODipine (NORVASC) 10 MG tablet Take 1 tablet (10 mg total) by mouth daily. 02/10/18   Elvina SidleLauenstein, Tabia Landowski, MD  aspirin EC 81 MG tablet Take 1 tablet (81 mg total) by mouth daily. 10/27/14   Ambrose FinlandKeck, Valerie A, NP  atorvastatin (LIPITOR) 20 MG tablet Take 1 tablet (20 mg total) by mouth daily. 02/10/18   Elvina SidleLauenstein, Julius Matus, MD  chlorthalidone (HYGROTON) 25 MG tablet Take 1 tablet (25 mg total) by mouth daily. 02/10/18   Elvina SidleLauenstein, Elese Rane, MD  cloNIDine (CATAPRES) 0.2 MG tablet Take 1 tablet (0.2 mg total) by mouth 2 (two) times daily. 02/10/18   Elvina SidleLauenstein, Bevely Hackbart, MD    Family History Family History  Problem Relation Age of Onset  . Diabetes Mother     Social History Social History   Tobacco Use  . Smoking status: Never Smoker  Substance Use Topics  . Alcohol use: Yes    Alcohol/week: 1.0 standard drinks    Types: 1 drink(s)  per week  . Drug use: No     Allergies   Patient has no known allergies.   Review of Systems Review of Systems   Physical Exam Triage Vital Signs ED Triage Vitals  Enc Vitals Group     BP      Pulse      Resp      Temp      Temp src      SpO2      Weight      Height      Head Circumference      Peak Flow      Pain Score      Pain Loc      Pain Edu?      Excl. in GC?    No data found.  Updated Vital Signs There were no vitals taken for this visit.   Physical Exam Vitals signs and nursing note reviewed.  Constitutional:      Appearance: Normal appearance. He is obese.  HENT:     Head: Normocephalic and atraumatic.     Right Ear: External ear normal.     Left Ear: External ear normal.     Nose: Nose normal.     Mouth/Throat:     Mouth: Mucous membranes are moist.  Eyes:  Conjunctiva/sclera: Conjunctivae normal.  Neck:     Musculoskeletal: Normal range of motion and neck supple.  Cardiovascular:     Rate and Rhythm: Normal rate. Rhythm irregular.  Pulmonary:     Effort: Pulmonary effort is normal.     Breath sounds: Normal breath sounds.  Musculoskeletal: Normal range of motion.        General: No swelling.  Skin:    General: Skin is warm and dry.  Neurological:     General: No focal deficit present.     Mental Status: He is alert and oriented to person, place, and time.  Psychiatric:        Mood and Affect: Mood normal.      UC Treatments / Results  Labs (all labs ordered are listed, but only abnormal results are displayed) Labs Reviewed - No data to display  EKG New onset atrial fibrillation  Radiology No results found.  Procedures Procedures (including critical care time)  Medications Ordered in UC Medications - No data to display  Initial Impression / Assessment and Plan / UC Course  I have reviewed the triage vital signs and the nursing notes.  Pertinent labs & imaging results that were available during my care of the  patient were reviewed by me and considered in my medical decision making (see chart for details).    Final Clinical Impressions(s) / UC Diagnoses   Final diagnoses:  Atrial fibrillation, unspecified type [facility](HCC)     Discharge Instructions     You need to go down to the emergency department right now.  You have atrial fibrillation.  You cannot go back to work until we get the electrical problem straightened out with your heart.    ED Prescriptions    None     Controlled Substance Prescriptions [place] Controlled Substance Registry consulted? Not Applicable   Elvina SidleLauenstein, Avaneesh Pepitone, MD 02/11/18 [id number]

## 2018-02-11 NOTE — ED Triage Notes (Signed)
Pt sent here from UC due to having abnormal EKG. Pt does not have hx of a.fib. [facility]. Pt endorses chest tightness and SOB.

## 2018-02-11 NOTE — Progress Notes (Signed)
ANTICOAGULATION CONSULT NOTE - Initial Consult  Pharmacy Consult for Heparin Indication: atrial fibrillation  No Known Allergies  Patient Measurements: Height: $RemoveBeforeD [id number]\' 11"ulated) : 75.3 Heparin Dosing Weight: 100 kg  Vital Signs: Temp: 98.2 F (36.8 C) (12/19 1824) Temp Source: Oral (12/19 1824) BP: 177/101 (12/19 2316) Pulse Rate: 60 (12/19 2248)  Labs: Recent Labs    02/11/18 1842 02/11/18 2103  HGB 13.6  --   HCT 42.9  --   PLT 151  --   CREATININE 1.23  --   TROPONINI  --  0.06*    Estimated Creatinine Clearance: 88.1 mL/min (by C-G formula based on SCr of 1.23 mg/dL).   Medical History: Past Medical History:  Diagnosis Date  . Hypercholesteremia   . Hypertension     Medications:  No current facility-administered medications on file prior to encounter.    Current Outpatient Medications on File Prior to Encounter  Medication Sig Dispense Refill  . amLODipine (NORVASC) 10 MG tablet Take 1 tablet (10 mg total) by mouth daily. 90 tablet 3  . atorvastatin (LIPITOR) 20 MG tablet Take 1 tablet (20 mg total) by mouth daily. (Patient taking differently: Take 20 mg by mouth daily before supper. ) 90 tablet 3  . chlorthalidone (HYGROTON) 25 MG tablet Take 1 tablet (25 mg total) by mouth daily. 90 tablet 0  . cloNIDine (CATAPRES) 0.2 MG tablet Take 1 tablet (0.2 mg total) by mouth 2 (two) times daily. (Patient taking differently: Take 0.1 mg by mouth 2 (two) times daily. ) 180 tablet 3  . aspirin EC 81 MG tablet Take 1 tablet (81 mg total) by mouth daily. (Patient not taking: Reported on 02/11/2018) 30 tablet 2  . Cyanocobalamin (VITAMIN B-12 PO) Take 1 tablet by mouth daily.       Assessment: 57 y.o. male with Afib for heparin Goal of Therapy:  Heparin level 0.3-0.7 units/ml Monitor platelets by anticoagulation protocol: Yes   Plan:  Heparin 4000 units IV bolus, then start heparin 1600 units/hr Follow-up am labs.    Eddie CandleAbbott, Seymour Pavlak Vernon 02/11/2018,11:36 PM

## 2018-02-11 NOTE — Telephone Encounter (Signed)
Pt. called with c/o feeling like his chest is congested.  Reported he can hear a wheeze at times.  Stated he had trouble laying flat last night, due to increased shortness of breath.  Stated it felt better to sit up.  Denied shortness of breath at this time.  Speaking in full sentences; did not note any resp. distress while talking.  Stated he has an infreq. cough.  Reported "I feel like my chest is congested and can't really bring anything up."  Reported he hears intermittent wheeze with his breathing.  Denied any nasal congestion or fever.  Reported intermittent cold chills last night.  Denied chest tightness.  Did admit to having "pain in the left corner of chest once in awhile."  Reported the last episode of chest pain was one week ago. Reported he went to UC yesterday for high BP reading of 217/116 and 177/96.  Was restarted on BP medication; stated has not been taking BP medication for about one yr.  Denied dizziness .  Pt. requesting recommendations for OTC medication for his chest congestion.  Reported he has an appt. 1/3 to establish care.  Home care advice given per protocol. Advised to go to UC or ER if chest pain or tightness, worsening shortness of breath, or increased wheezing.  Verb. Understanding.          Reason for Disposition . Cough  Additional Information . Negative: Wheezing is present    C/o difficulty laying down flat since last night ; stated he feels normal with sitting up.  Reported coughing is very infreq.; c/o intermittent wheezing in chest.  Answer Assessment - Initial Assessment Questions 1. RESPIRATORY STATUS: "Describe your breathing?" (e.g., wheezing, shortness of breath, unable to speak, severe coughing)      When I sit up, I can breathe; but when I lay down it is hard to breathe 2. ONSET: "When did this breathing problem begin?"      Last night about 8:30 PM  3. PATTERN "Does the difficult breathing come and go, or has it been constant since it started?"      Comes  and goes 4. SEVERITY: "How bad is your breathing?" (e.g., mild, moderate, severe)    - MILD: No SOB at rest, mild SOB with walking, speaks normally in sentences, can lay down, no retractions, pulse < 100.    - MODERATE: SOB at rest, SOB with minimal exertion and prefers to sit, cannot lie down flat, speaks in phrases, mild retractions, audible wheezing, pulse 100-120.    - SEVERE: Very SOB at rest, speaks in single words, struggling to breathe, sitting hunched forward, retractions, pulse > 120      Moderate; hard to lay down 5. RECURRENT SYMPTOM: "Have you had difficulty breathing before?" If so, ask: "When was the last time?" and "What happened that time?"      No 6. CARDIAC HISTORY: "Do you have any history of heart disease?" (e.g., heart attack, angina, bypass surgery, angioplasty)      Denied 7. LUNG HISTORY: "Do you have any history of lung disease?"  (e.g., pulmonary embolus, asthma, emphysema)     Denied  8. CAUSE: "What do you think is causing the breathing problem?"     Feels his chest is congested  9. OTHER SYMPTOMS: "Do you have any other symptoms? (e.g., dizziness, runny nose, cough, chest pain, fever)     Denied fever; felt cold last night; SOB with laying, denied nasal congestion, intermittent wheeze  10. TRAVEL: "Have you  traveled out of the country in the last month?" (e.g., travel history, exposures)       No  Answer Assessment - Initial Assessment Questions 1. ONSET: "When did the cough begin?"      Some coughing;  Thinks has chest congestion  2. SEVERITY: "How bad is the cough today?"      Infrequent  3. RESPIRATORY DISTRESS: "Describe your breathing."      Its hard to lay flat  4. FEVER: "Do you have a fever?" If so, ask: "What is your temperature, how was it measured, and when did it start?"     Intermittent feeling cold  5. SPUTUM: "Describe the color of your sputum" (clear, white, yellow, green)    Small amt of yellow  6. HEMOPTYSIS: "Are you coughing up any  blood?" If so ask: "How much?" (flecks, streaks, tablespoons, etc.)     no 7. CARDIAC HISTORY: "Do you have any history of heart disease?" (e.g., heart attack, congestive heart failure)      no 8. LUNG HISTORY: "Do you have any history of lung disease?"  (e.g., pulmonary embolus, asthma, emphysema)     no 9. PE RISK FACTORS: "Do you have a history of blood clots?" (or: recent major surgery, recent prolonged travel, bedridden)     denied 10. OTHER SYMPTOMS: "Do you have any other symptoms?" (e.g., runny nose, wheezing, chest pain)       Felt short of breath with laying flat, c/o intermittent upper left chest tightness- last episode was a week ago ; hearing an intermittent wheeze  11. PREGNANCY: "Is there any chance you are pregnant?" "When was your last menstrual period?"       N/a  12. TRAVEL: "Have you traveled out of the country in the last month?" (e.g., travel history, exposures)       No  Protocols used: COUGH - ACUTE PRODUCTIVE-A-AH, BREATHING DIFFICULTY-A-AH

## 2018-02-11 NOTE — ED Provider Notes (Signed)
Emergency Department Provider Note   I have reviewed the triage vital signs and the nursing notes.   HISTORY  Chief Complaint Abnormal ECG   HPI James [name] is a 57 y.o. male with PMH of HTN and HLD presents to the emergency department for evaluation of elevated blood pressure and new onset A. Fib.  Patient states that he is feeling somewhat short of breath yesterday so went to [facility] to check his blood pressures.  He has been off of his blood pressure and cholesterol medication for the past 2 years.  He states that his blood pressure was very high and so he presented to urgent care today.  They performed an EKG and found the patient to be in rate controlled atrial fibrillation.  He was referred to the emergency department for further evaluation.  He is not experiencing any chest pain, shortness of breath, heart palpitations, lightheadedness.  No fevers or chills.  No recent infection symptoms.  Patient with no history of intracerebral hemorrhage or GI bleeding.  Past Medical History:  Diagnosis Date  . Hypercholesteremia   . Hypertension     Patient Active Problem List   Diagnosis Date Noted  . Acute respiratory failure with hypoxia (HCC) 02/11/2018  . HTN (hypertension) 10/04/2014    History reviewed. No pertinent surgical history.  Allergies Patient has no known allergies.  Family History  Problem Relation Age of Onset  . Diabetes Mother     Social History Social History   Tobacco Use  . Smoking status: Never Smoker  Substance Use Topics  . Alcohol use: Yes    Alcohol/week: 1.0 standard drinks    Types: 1 drink(s) per week  . Drug use: No    Review of Systems  Constitutional: No fever/chills. Positive intermittent lightheadedness.  Eyes: No visual changes. ENT: No sore throat. Cardiovascular: Denies chest pain. Respiratory: Positive shortness of breath. Gastrointestinal: No abdominal pain.  No nausea, no vomiting.  No diarrhea.  No  constipation. Genitourinary: Negative for dysuria. Musculoskeletal: Negative for back pain. Skin: Negative for rash. Neurological: Negative for headaches, focal weakness or numbness.  10-point ROS otherwise negative.  ____________________________________________   PHYSICAL EXAM:  VITAL SIGNS: ED Triage Vitals  Enc Vitals Group     BP --      Pulse Rate 02/11/18 1824 71     Resp 02/11/18 1824 (!) 24     Temp 02/11/18 1824 98.2 F (36.8 C)     Temp Source 02/11/18 1824 Oral     SpO2 02/11/18 1824 95 %     Weight 02/11/18 1822 275 lb (124.7 kg)     Height 02/11/18 1822 5\' 11"  (1.803 m)     Pain Score 02/11/18 1822 0    Constitutional: Alert and oriented. Well appearing and in no acute distress. Eyes: Conjunctivae are normal.  Head: Atraumatic. Nose: No congestion/rhinnorhea. Mouth/Throat: Mucous membranes are moist. Neck: No stridor.   Cardiovascular: Normal rate, regular rhythm. Good peripheral circulation. Grossly normal heart sounds.   Respiratory: Normal respiratory effort.  No retractions. Lungs CTAB. Gastrointestinal: Soft and nontender. No distention.  Musculoskeletal: No lower extremity tenderness nor edema. No gross deformities of extremities. Neurologic:  Normal speech and language. No gross focal neurologic deficits are appreciated.  Skin:  Skin is warm, dry and intact. No rash noted.  ____________________________________________   LABS (all labs ordered are listed, but only abnormal results are displayed)  Labs Reviewed  CBC - Abnormal; Notable for the following components:  Result Value   RDW 15.7 (*)    All other components within normal limits  TROPONIN I - Abnormal; Notable for the following components:   Troponin I 0.06 (*)    All other components within normal limits  BASIC METABOLIC PANEL  MAGNESIUM   ____________________________________________  EKG   EKG Interpretation  Date/Time:  Thursday February 11 2018 18:24:29 EST Ventricular  Rate:  82 PR Interval:    QRS Duration: 95 QT Interval:  444 QTC Calculation: 519 R Axis:   13 Text Interpretation:  Atrial fibrillation Prolonged QT interval No STEMI.  Confirmed by Alona BeneLong, Aws Shere [id number]) on 02/11/2018 6:53:25 PM       ____________________________________________  RADIOLOGY  Dg Chest 2 View  Result Date: 02/11/2018 CLINICAL DATA:  Atrial fibrillation EXAM: CHEST - 2 VIEW COMPARISON:  01/16/2009 FINDINGS: Mild cardiomegaly with vascular congestion and hazy bilateral opacities suspicious for pulmonary edema. Trace effusions. No pneumothorax. IMPRESSION: Mild cardiomegaly with vascular congestion and mild pulmonary edema. Trace pleural effusions. Electronically Signed   By: Jasmine PangKim  Fujinaga M.D.   On: 02/11/2018 20:23    ____________________________________________   PROCEDURES  Procedure(s) performed:   Procedures  CRITICAL CARE Performed by: Maia PlanJoshua G Bryden Darden Total critical care time: 35 minutes Critical care time was exclusive of separately billable procedures and treating other patients. Critical care was necessary to treat or prevent imminent or life-threatening deterioration. Critical care was time spent personally by me on the following activities: development of treatment plan with patient and/or surrogate as well as nursing, discussions with consultants, evaluation of patient's response to treatment, examination of patient, obtaining history from patient or surrogate, ordering and performing treatments and interventions, ordering and review of laboratory studies, ordering and review of radiographic studies, pulse oximetry and re-evaluation of patient's condition.  Alona BeneJoshua Lourdes Manning, MD Emergency Medicine  ____________________________________________   INITIAL IMPRESSION / ASSESSMENT AND PLAN / ED COURSE  Pertinent labs & imaging results that were available during my care of the patient were reviewed by me and considered in my medical decision making (see chart  for details).  Patient presents to the emergency department with new onset A. Fib.  This was noticed after urgent care visit for asymptomatic hypertension.  No clear onset of symptoms.  Patient does not a candidate for ED cardioversion.   This patients CHA2DS2-VASc Score and unadjusted Ischemic Stroke Rate (% per year) is equal to 0.6 % stroke rate/year from a score of 1  Above score calculated as 1 point each if present [CHF, HTN, DM, Vascular=MI/PAD/Aortic Plaque, Age if 65-74, or Male] Above score calculated as 2 points each if present [Age > 75, or Stroke/TIA/TE]  Patient labs reviewed. Troponin is elevated to 0.06. No baseline. No active chest pain but suspect that this could be BP related. Hydralazine given. CXR with mild edema. Plan for lasix and admit for troponin trending and BP control.   Discussed patient's case with Dr. Toniann FailKakrakandy to request admission. Patient and family (if present) updated with plan. Care transferred to Hospitalist service.  I reviewed all nursing notes, vitals, pertinent old records, EKGs, labs, imaging (as available).  ____________________________________________  FINAL CLINICAL IMPRESSION(S) / ED DIAGNOSES  Final diagnoses:  Hypertensive emergency  Acute pulmonary edema (HCC)    MEDICATIONS GIVEN DURING THIS VISIT:  Medications  hydrALAZINE (APRESOLINE) injection 10 mg (10 mg Intravenous Given 02/11/18 2244)  furosemide (LASIX) injection 40 mg (40 mg Intravenous Given 02/11/18 2245)    Note:  This document was prepared using Dragon voice recognition software  and may include unintentional dictation errors.  Alona BeneJoshua Tresa Jolley, MD Emergency Medicine    Brynley Cuddeback, Arlyss RepressJoshua G, MD 02/11/18 [phone]

## 2018-02-11 NOTE — ED Notes (Signed)
Pt walks ~200 paces to restroom and back to hall bed. Pt sat remains 96% and higher during trip. Pt steady on feet. No light-headed feelings.

## 2018-02-11 NOTE — ED Notes (Signed)
Seen and triaged by Dr. Milus GlazierLauenstein.

## 2018-02-12 ENCOUNTER — Encounter (HOSPITAL_COMMUNITY)
Admission: EM | Disposition: A | Discharge status: Home / Self Care | Payer: Self-pay | Source: Home / Self Care | Attending: Emergency Medicine

## 2018-02-12 ENCOUNTER — Observation Stay (HOSPITAL_COMMUNITY): Payer: 59

## 2018-02-12 ENCOUNTER — Encounter (HOSPITAL_COMMUNITY): Payer: Self-pay | Admitting: Internal Medicine

## 2018-02-12 DIAGNOSIS — I160 Hypertensive urgency: Secondary | ICD-10-CM | POA: Diagnosis present

## 2018-02-12 DIAGNOSIS — R7989 Other specified abnormal findings of blood chemistry: Secondary | ICD-10-CM | POA: Diagnosis present

## 2018-02-12 DIAGNOSIS — I482 Chronic atrial fibrillation, unspecified: Secondary | ICD-10-CM | POA: Diagnosis present

## 2018-02-12 DIAGNOSIS — I4891 Unspecified atrial fibrillation: Secondary | ICD-10-CM | POA: Diagnosis present

## 2018-02-12 DIAGNOSIS — R778 Other specified abnormalities of plasma proteins: Secondary | ICD-10-CM | POA: Diagnosis present

## 2018-02-12 DIAGNOSIS — J810 Acute pulmonary edema: Secondary | ICD-10-CM | POA: Diagnosis not present

## 2018-02-12 DIAGNOSIS — I161 Hypertensive emergency: Secondary | ICD-10-CM | POA: Diagnosis not present

## 2018-02-12 DIAGNOSIS — J9601 Acute respiratory failure with hypoxia: Secondary | ICD-10-CM

## 2018-02-12 HISTORY — PX: LEFT HEART CATH AND CORONARY ANGIOGRAPHY: CATH118249

## 2018-02-12 LAB — TROPONIN I
Troponin I: 0.08 ng/mL (ref <0.03)
Troponin I: 0.09 ng/mL (ref <0.03)
Troponin I: 0.07 ng/mL (ref <0.03)
Troponin I: 0.06 ng/mL (ref <0.03)

## 2018-02-12 LAB — HEPARIN LEVEL (UNFRACTIONATED): Heparin Unfractionated: 0.36 IU/mL (ref 0.30–0.70)

## 2018-02-12 LAB — CBC
HCT: 42.4 % (ref 39.0–52.0)
Hemoglobin: 13.8 g/dL (ref 13.0–17.0)
MCH: 26.4 pg (ref 26.0–34.0)
MCHC: 32.5 g/dL (ref 30.0–36.0)
MCV: 81.2 fL (ref 80.0–100.0)
PLATELETS: 145 10*3/uL — AB (ref 150–400)
RBC: 5.22 MIL/uL (ref 4.22–5.81)
RDW: 15.6 % — ABNORMAL HIGH (ref 11.5–15.5)
WBC: 6.7 10*3/uL (ref 4.0–10.5)
nRBC: 0 % (ref 0.0–0.2)

## 2018-02-12 LAB — HIV ANTIBODY (ROUTINE TESTING W REFLEX): HIV Screen 4th Generation wRfx: NONREACTIVE

## 2018-02-12 LAB — ECHOCARDIOGRAM COMPLETE
HEIGHTINCHES: 71 in
Weight: 4238.4 oz

## 2018-02-12 LAB — BASIC METABOLIC PANEL
Anion gap: 12 (ref 5–15)
BUN: 15 mg/dL (ref 6–20)
CO2: 24 mmol/L (ref 22–32)
Calcium: 9.4 mg/dL (ref 8.9–10.3)
Chloride: 104 mmol/L (ref 98–111)
Creatinine, Ser: 1.37 mg/dL — ABNORMAL HIGH (ref 0.61–1.24)
GFR calc Af Amer: >60 mL/min (ref >60)
GFR calc non Af Amer: 56 mL/min — ABNORMAL LOW (ref >60)
GLUCOSE: 116 mg/dL — AB (ref 70–99)
Potassium: 3.3 mmol/L — ABNORMAL LOW (ref 3.5–5.1)
Sodium: 140 mmol/L (ref 135–145)

## 2018-02-12 LAB — TSH: TSH: 3.626 u[IU]/mL (ref 0.350–4.500)

## 2018-02-12 LAB — MAGNESIUM: MAGNESIUM: 1.9 mg/dL (ref 1.7–2.4)

## 2018-02-12 SURGERY — LEFT HEART CATH AND CORONARY ANGIOGRAPHY
Anesthesia: LOCAL

## 2018-02-12 MED ORDER — ATORVASTATIN CALCIUM 10 MG PO TABS
20.0000 mg | ORAL_TABLET | Freq: Every day | ORAL | Status: DC
  Administered 2018-02-12 – 2018-02-13 (×2): 20 mg via ORAL
  Filled 2018-02-12 (×3): qty 2

## 2018-02-12 MED ORDER — ASPIRIN 81 MG PO CHEW
81.0000 mg | CHEWABLE_TABLET | Freq: Every day | ORAL | Status: DC
  Administered 2018-02-13: 81 mg via ORAL
  Filled 2018-02-12: qty 1

## 2018-02-12 MED ORDER — SODIUM CHLORIDE 0.9 % IV SOLN
INTRAVENOUS | Status: AC
  Administered 2018-02-12: 16:00:00 via INTRAVENOUS

## 2018-02-12 MED ORDER — HEPARIN SODIUM (PORCINE) 1000 UNIT/ML IJ SOLN
INTRAMUSCULAR | Status: DC | PRN
  Administered 2018-02-12: 6000 [IU] via INTRAVENOUS

## 2018-02-12 MED ORDER — ACETAMINOPHEN 325 MG PO TABS
650.0000 mg | ORAL_TABLET | ORAL | Status: DC | PRN
  Administered 2018-02-12 – 2018-02-13 (×3): 650 mg via ORAL
  Filled 2018-02-12 (×3): qty 2

## 2018-02-12 MED ORDER — VERAPAMIL HCL 2.5 MG/ML IV SOLN
INTRAVENOUS | Status: AC
  Filled 2018-02-12: qty 2

## 2018-02-12 MED ORDER — HEPARIN SODIUM (PORCINE) 1000 UNIT/ML IJ SOLN
INTRAMUSCULAR | Status: AC
  Filled 2018-02-12: qty 1

## 2018-02-12 MED ORDER — SODIUM CHLORIDE 0.9% FLUSH
3.0000 mL | Freq: Two times a day (BID) | INTRAVENOUS | Status: DC
  Administered 2018-02-12 – 2018-02-14 (×4): 3 mL via INTRAVENOUS

## 2018-02-12 MED ORDER — HEPARIN (PORCINE) IN NACL 1000-0.9 UT/500ML-% IV SOLN
INTRAVENOUS | Status: AC
  Filled 2018-02-12: qty 500

## 2018-02-12 MED ORDER — ACETAMINOPHEN 325 MG PO TABS: 650.0000 mg | ORAL_TABLET | Freq: Four times a day (QID) | ORAL | Status: DC | PRN

## 2018-02-12 MED ORDER — HEPARIN (PORCINE) IN NACL 1000-0.9 UT/500ML-% IV SOLN
INTRAVENOUS | Status: DC | PRN
  Administered 2018-02-12: 500 mL

## 2018-02-12 MED ORDER — ASPIRIN EC 81 MG PO TBEC
81.0000 mg | DELAYED_RELEASE_TABLET | Freq: Every day | ORAL | Status: DC
  Administered 2018-02-12: 81 mg via ORAL
  Filled 2018-02-12: qty 1

## 2018-02-12 MED ORDER — FENTANYL CITRATE (PF) 100 MCG/2ML IJ SOLN
INTRAMUSCULAR | Status: DC | PRN
  Administered 2018-02-12: 50 ug via INTRAVENOUS

## 2018-02-12 MED ORDER — IOHEXOL 350 MG/ML SOLN
INTRAVENOUS | Status: DC | PRN
  Administered 2018-02-12: 90 mL via INTRA_ARTERIAL

## 2018-02-12 MED ORDER — LIDOCAINE HCL (PF) 1 % IJ SOLN
INTRAMUSCULAR | Status: DC | PRN
  Administered 2018-02-12: 2 mL

## 2018-02-12 MED ORDER — MIDAZOLAM HCL 2 MG/2ML IJ SOLN
INTRAMUSCULAR | Status: AC
  Filled 2018-02-12: qty 2

## 2018-02-12 MED ORDER — SODIUM CHLORIDE 0.9% FLUSH: 3.0000 mL | INTRAVENOUS | Status: DC | PRN

## 2018-02-12 MED ORDER — ISOSORB DINITRATE-HYDRALAZINE 20-37.5 MG PO TABS
1.0000 | ORAL_TABLET | Freq: Two times a day (BID) | ORAL | Status: DC
  Administered 2018-02-12 – 2018-02-13 (×3): 1 via ORAL
  Filled 2018-02-12 (×3): qty 1

## 2018-02-12 MED ORDER — ASPIRIN 81 MG PO CHEW: 81.0000 mg | CHEWABLE_TABLET | ORAL | Status: AC

## 2018-02-12 MED ORDER — SODIUM CHLORIDE 0.9% FLUSH
3.0000 mL | Freq: Two times a day (BID) | INTRAVENOUS | Status: DC
  Administered 2018-02-12: 3 mL via INTRAVENOUS

## 2018-02-12 MED ORDER — MIDAZOLAM HCL 2 MG/2ML IJ SOLN
INTRAMUSCULAR | Status: DC | PRN
  Administered 2018-02-12: 1 mg via INTRAVENOUS

## 2018-02-12 MED ORDER — SODIUM CHLORIDE 0.9 % IV SOLN: 250.0000 mL | INTRAVENOUS | Status: DC | PRN

## 2018-02-12 MED ORDER — CLOPIDOGREL BISULFATE 75 MG PO TABS
75.0000 mg | ORAL_TABLET | Freq: Every day | ORAL | Status: DC
  Administered 2018-02-12 – 2018-02-13 (×2): 75 mg via ORAL
  Filled 2018-02-12 (×2): qty 1

## 2018-02-12 MED ORDER — AMLODIPINE BESYLATE 10 MG PO TABS
10.0000 mg | ORAL_TABLET | Freq: Every day | ORAL | Status: DC
  Administered 2018-02-12 – 2018-02-14 (×3): 10 mg via ORAL
  Filled 2018-02-12 (×3): qty 1

## 2018-02-12 MED ORDER — FENTANYL CITRATE (PF) 100 MCG/2ML IJ SOLN
INTRAMUSCULAR | Status: AC
  Filled 2018-02-12: qty 2

## 2018-02-12 MED ORDER — SODIUM CHLORIDE 0.9 % IV SOLN
INTRAVENOUS | Status: DC
  Administered 2018-02-12: 13:00:00 via INTRAVENOUS

## 2018-02-12 MED ORDER — ONDANSETRON HCL 4 MG/2ML IJ SOLN: 4.0000 mg | Freq: Four times a day (QID) | INTRAMUSCULAR | Status: DC | PRN

## 2018-02-12 MED ORDER — CHLORTHALIDONE 25 MG PO TABS
25.0000 mg | ORAL_TABLET | Freq: Every day | ORAL | Status: DC
  Administered 2018-02-12 – 2018-02-14 (×3): 25 mg via ORAL
  Filled 2018-02-12 (×3): qty 1

## 2018-02-12 MED ORDER — POTASSIUM CHLORIDE CRYS ER 20 MEQ PO TBCR
40.0000 meq | EXTENDED_RELEASE_TABLET | Freq: Once | ORAL | Status: AC
  Administered 2018-02-12: 40 meq via ORAL
  Filled 2018-02-12: qty 2

## 2018-02-12 MED ORDER — LIDOCAINE HCL (PF) 1 % IJ SOLN
INTRAMUSCULAR | Status: AC
  Filled 2018-02-12: qty 30

## 2018-02-12 MED ORDER — ACETAMINOPHEN 650 MG RE SUPP: 650.0000 mg | Freq: Four times a day (QID) | RECTAL | Status: DC | PRN

## 2018-02-12 MED ORDER — HYDRALAZINE HCL 20 MG/ML IJ SOLN
5.0000 mg | INTRAMUSCULAR | Status: DC | PRN
  Filled 2018-02-12: qty 1

## 2018-02-12 MED ORDER — CLONIDINE HCL 0.1 MG PO TABS
0.1000 mg | ORAL_TABLET | Freq: Two times a day (BID) | ORAL | Status: DC
  Administered 2018-02-12: 0.1 mg via ORAL
  Filled 2018-02-12: qty 1

## 2018-02-12 MED ORDER — ONDANSETRON HCL 4 MG PO TABS: 4.0000 mg | ORAL_TABLET | Freq: Four times a day (QID) | ORAL | Status: DC | PRN

## 2018-02-12 MED ORDER — VERAPAMIL HCL 2.5 MG/ML IV SOLN
INTRAVENOUS | Status: DC | PRN
  Administered 2018-02-12: 8 mL via INTRA_ARTERIAL

## 2018-02-12 SURGICAL SUPPLY — 12 items
CATH 5FR JL3.5 JR4 ANG PIG MP (CATHETERS) ×1 IMPLANT
CATH INFINITI 5 FR 3DRC (CATHETERS) ×1 IMPLANT
COVER DOME SNAP 22 D (MISCELLANEOUS) ×1 IMPLANT
DEVICE RAD COMP TR BAND LRG (VASCULAR PRODUCTS) ×1 IMPLANT
GLIDESHEATH SLEND A-KIT 6F 22G (SHEATH) ×1 IMPLANT
GUIDEWIRE INQWIRE 1.5J.035X260 (WIRE) IMPLANT
INQWIRE 1.5J .035X260CM (WIRE) ×2
KIT HEART LEFT (KITS) ×2 IMPLANT
PACK CARDIAC CATHETERIZATION (CUSTOM PROCEDURE TRAY) ×2 IMPLANT
SYR MEDRAD MARK 7 150ML (SYRINGE) ×1 IMPLANT
TRANSDUCER W/STOPCOCK (MISCELLANEOUS) ×2 IMPLANT
TUBING CIL FLEX 10 FLL-RA (TUBING) ×2 IMPLANT

## 2018-02-12 NOTE — Plan of Care (Signed)
  Problem: Education: Goal: Knowledge of General Education information will improve Description: Including pain rating scale, medication(s)/side effects and non-pharmacologic comfort measures Outcome: Progressing   Problem: Health Behavior/Discharge Planning: Goal: Ability to manage health-related needs will improve Outcome: Progressing   Problem: Clinical Measurements: Goal: Ability to maintain clinical measurements within normal limits will improve Outcome: Progressing Goal: Will remain free from infection Outcome: Progressing Goal: Respiratory complications will improve Outcome: Progressing   Problem: Activity: Goal: Risk for activity intolerance will decrease Outcome: Progressing   Problem: Nutrition: Goal: Adequate nutrition will be maintained Outcome: Progressing   Problem: Coping: Goal: Level of anxiety will decrease Outcome: Progressing   Problem: Elimination: Goal: Will not experience complications related to bowel motility Outcome: Progressing Goal: Will not experience complications related to urinary retention Outcome: Progressing   Problem: Pain Managment: Goal: General experience of comfort will improve Outcome: Progressing   Problem: Safety: Goal: Ability to remain free from injury will improve Outcome: Progressing   Problem: Skin Integrity: Goal: Risk for impaired skin integrity will decrease Outcome: Progressing   

## 2018-02-12 NOTE — Progress Notes (Signed)
Echocardiogram 02/12/2018: - Left ventricle: The cavity size was normal. There was moderate   concentric hypertrophy. Systolic function was mildly reduced. The   estimated ejection fraction was in the range of 45% to 50%. Wall   motion was normal; there were no regional wall motion   abnormalities. Unable to evaluate diastolic function due to   atrial fibrillation. - Mitral valve: Moderately dilated annulus. There was mild   regurgitation. - Left atrium: The atrium was severely dilated. - Right ventricle: Systolic function was low normal. - Right atrium: The atrium was mildly dilated. - No evidence of pulmonary hypertension.  Cath 02/12/2018: LM: Distal 20% disease LAD: Ostial 20% disease Ramus: No significant disease LCx: Dominant. No significant disease RCA: Nondominant. No significant disease  LVEDP normal  Conclusion: Mild nonobstructive coronary artery disease Type 2 MI in the setting of hypertensive urgency.   Shortness of breath and mild troponin elevation type 2 MI in the setting of hypertensive urgency. I do not think he has significant heart failure. Low normal EF likely due to uncontrolled hypertension. His CHA2DS2VASc score is 1. No anticoagulation indicated, unless considering cardioversion. Given his severe left atrial dilatation. I do not think rhythm control therapy is a good option for him. His ventricular rate is slow, likely due to clonidine, as well as high vagal tone. No rate control therapy necessary. While amyloidosis is in the differential in a middle aged [place] man with mildly reduced LVEF, biatrial dilatation, atrial fibrillation, his uncontrolled hypertension, untreated OSA are more likely reasons for these findings.  To summarize, I recommend addressing the precipitating factors including hypertension, OSA. Avoid beta blockers, and clonidine given his slow ventricular response. Recommend Bidil 20-37/5 mg bid to start and uptitrate as tolerated.  No anticoagulation necessary for Afib. However, recommend aspirin/plavix for medical treatment of type 2 MI, along with high intensity statin.  Will arrange outpatient follow up.  James NegusManish [name] James Kosiba, MD [facility] Pager: [phone] Office: [phone] If no answer Cell [phone]

## 2018-02-12 NOTE — Consult Note (Addendum)
Reason for Consult: Atrial fibrillation, troponin elevation Referring Physician: [facility]  James [name] is an 57 y.o. male.  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  Patient was at [facility] assay checking his blood pressure which was found to be elevated. He went to urgent care where he was started on amlodipine 10, chlorthalidone 25, clonidine 0.2, atorvastatin 20 mg daily. However, he started developing shortness of breath thereafter prompting visit to the ED. His chest x-ray showed mild condition and he was given IV Lasix 40 mg. He was found to be in atrial fibrillation with slow ventricular response. Patient denied any palpitations or chest pain. His shortness of breath has also improved. However, his troponin is mildly elevated reaching 0.08 NG/ML.    Past Medical History:  Diagnosis Date  . Hypercholesteremia   . Hypertension     History reviewed. No pertinent surgical history.  Family History  Problem Relation Age of Onset  . Diabetes Mother   . CAD Brother     Social History:  reports that he has never smoked. He has never used smokeless tobacco. He reports current alcohol use of about 1.0 standard drinks of alcohol per week. He reports that he does not use drugs.  Allergies: No Known Allergies  Medications: I have reviewed the patient's current medications.  Results for orders placed or performed during the hospital encounter of 02/11/18 (from the past 48 hour(s))  Basic metabolic panel     Status: None   Collection Time: 02/11/18  6:42 PM  Result Value Ref Range   Sodium 139 135 - 145 mmol/L   Potassium 3.8 3.5 - 5.1 mmol/L    Comment: SLIGHT HEMOLYSIS   Chloride 106 98 - 111 mmol/L   CO2 22 22 - 32 mmol/L   Glucose, Bld 94 70 - 99 mg/dL   BUN 17 6 - 20 mg/dL   Creatinine, Ser 7.82 0.61 - 1.24 mg/dL   Calcium 9.5 8.9 -  95.6 mg/dL   GFR calc non Af Amer >60 >60 mL/min   GFR calc Af Amer >60 >60 mL/min   Anion gap 11 5 - 15    Comment: Performed at [facility], [address], [place], [place] [address]  Magnesium     Status: None   Collection Time: 02/11/18  6:42 PM  Result Value Ref Range   Magnesium 1.8 1.7 - 2.4 mg/dL    Comment: Performed at [facility], [address], [place], [place] [address]  CBC     Status: Abnormal   Collection Time: 02/11/18  6:42 PM  Result Value Ref Range   WBC 6.5 4.0 - 10.5 K/uL   RBC 5.09 4.22 - 5.81 MIL/uL   Hemoglobin 13.6 13.0 - 17.0 g/dL   HCT 69.6 29.5 - 28.4 %   MCV 84.3 80.0 - 100.0 fL   MCH 26.7 26.0 - 34.0 pg   MCHC 31.7 30.0 - 36.0 g/dL   RDW 13.2 (H) 44.0 - 10.2 %   Platelets 151 150 - 400 K/uL   nRBC 0.0 0.0 - 0.2 %    Comment: Performed at [facility], [address], [place], [place] [address]  Troponin I - Once     Status: Abnormal   Collection Time: 02/11/18  9:03 PM  Result Value Ref Range   Troponin I 0.06 (HH) <0.03 ng/mL  Comment: CRITICAL RESULT CALLED TO, READ BACK BY AND VERIFIED WITH: STRAUGHAN C,RN 02/11/18 2203 WAYK Performed at [facility], [address] [place], [place] [address]   Basic metabolic panel     Status: Abnormal   Collection Time: 02/12/18  2:09 AM  Result Value Ref Range   Sodium 140 135 - 145 mmol/L   Potassium 3.3 (L) 3.5 - 5.1 mmol/L   Chloride 104 98 - 111 mmol/L   CO2 24 22 - 32 mmol/L   Glucose, Bld 116 (H) 70 - 99 mg/dL   BUN 15 6 - 20 mg/dL   Creatinine, Ser 6.041.37 (H) 0.61 - 1.24 mg/dL   Calcium 9.4 8.9 - 54.010.3 mg/dL   GFR calc non Af Amer 56 (L) >60 mL/min   GFR calc Af Amer >60 >60 mL/min   Anion gap 12 5 - 15    Comment: Performed at [facility], [address] [place], [place] [address]  CBC     Status: Abnormal   Collection Time: 02/12/18  2:09 AM  Result Value Ref Range   WBC 6.7 4.0 - 10.5 K/uL   RBC 5.22 4.22 - 5.81 MIL/uL   Hemoglobin 13.8 13.0 - 17.0  g/dL   HCT 14.742.4 82.939.0 - 56.252.0 %   MCV 81.2 80.0 - 100.0 fL   MCH 26.4 26.0 - 34.0 pg   MCHC 32.5 30.0 - 36.0 g/dL   RDW 13.015.6 (H) 86.511.5 - 78.415.5 %   Platelets 145 (L) 150 - 400 K/uL   nRBC 0.0 0.0 - 0.2 %    Comment: Performed at [facility], [address] [place], [place] [address]  Magnesium     Status: None   Collection Time: 02/12/18  2:09 AM  Result Value Ref Range   Magnesium 1.9 1.7 - 2.4 mg/dL    Comment: Performed at [facility], [address] [place], [place] [address]  TSH     Status: None   Collection Time: 02/12/18  2:09 AM  Result Value Ref Range   TSH 3.626 0.350 - 4.500 uIU/mL    Comment: Performed by a 3rd Generation assay with a functional sensitivity of <=0.01 uIU/mL. Performed at [facility], [address] [place], [place] [address]   Troponin I - Now Then Q6H     Status: Abnormal   Collection Time: 02/12/18  2:09 AM  Result Value Ref Range   Troponin I 0.08 (HH) <0.03 ng/mL    Comment: CRITICAL VALUE NOTED.  VALUE IS CONSISTENT WITH PREVIOUSLY REPORTED AND CALLED VALUE. Performed at [facility], [address] [place], [place] [address]     Dg Chest 2 View  Result Date: 02/11/2018 CLINICAL DATA:  Atrial fibrillation EXAM: CHEST - 2 VIEW COMPARISON:  01/16/2009 FINDINGS: Mild cardiomegaly with vascular congestion and hazy bilateral opacities suspicious for pulmonary edema. Trace effusions. No pneumothorax. IMPRESSION: Mild cardiomegaly with vascular congestion and mild pulmonary edema. Trace pleural effusions. Electronically Signed   By: Jasmine PangKim  Fujinaga M.D.   On: 02/11/2018 20:23    Review of Systems  Constitutional: Negative.   HENT: Negative.   Respiratory: Positive for shortness of breath (Currently improved).   Cardiovascular: Negative for chest pain and leg swelling.  Genitourinary: Negative.   Musculoskeletal: Negative.   Skin: Negative.   Neurological: Negative.   Endo/Heme/Allergies: Does not bruise/bleed easily.   Psychiatric/Behavioral: Negative.   All other systems reviewed and are negative.  Blood pressure Marland Kitchen(!)  160/103, pulse 62, temperature 98.6 F (37 C), temperature source Oral, resp. rate 18, height 5\' 11"  (1.803 m), weight 120.2 kg, SpO2 90 %. Physical Exam  Nursing note and vitals reviewed. Constitutional: He is oriented to person, place, and time. He appears well-developed and well-nourished. No distress.  Cardiovascular: Normal heart sounds and intact distal pulses. An irregularly irregular rhythm present. Bradycardia present.  No murmur heard. Respiratory: Effort normal and breath sounds normal. He has no wheezes. He has no rales.  GI: Soft. Bowel sounds are normal.  Musculoskeletal:        General: No edema.  Neurological: He is alert and oriented to person, place, and time.  Skin: Skin is warm and dry.  Psychiatric: He has a normal mood and affect.    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 New diagnosis of atrial fibrillation, and troponin elevation  Shortness of breath: While most likely possibility is hypertensive urgency leading to mild pulmonary edema and troponin elevation, patient has significant risk factors for coronary artery disease including uncontrolled hypertension, hyperlipidemia, and strong family history of CAD. Cannot exclude NSTEMI due to obstructive coronary artery disease without further evaluation. In the setting that his troponin is still mildly elevated, he will best benefit from diagnostic coronary angiogram and consideration for intervention, rather than nonischemic evaluation.  Atrial fibrillation: Currently rate controlled. Unknown duration. Patient is asymptomatic from atrial flutter ablation standpoint. His ventricular rate is slow, most likely due to clonidine. Recommend switching clonidine to a  different agent such as BiDil. Patient will have to be want to avoid this PDE5 inhibitor use while on Bidil. Unable to use beta blockers due to resting bradycardia. Patient likely has untreated objective sleep apnea and will need outpatient management. Echocardiogram is pending. Will address anticoagulation after coronary angiogram and echocardiogram.   Hyperlipidemia: Recommend higher dose Lipitor 20 mg daily.  CKD 3: Likely hypertensive nephropathy. Will hydrate for cath  [facility] J Zakariyah Freimark 02/12/2018, 7:49 AM   Elder NegusManish J Landry Lookingbill, MD [facility] Pager: [phone] Office: [phone] If no answer Cell [phone]

## 2018-02-12 NOTE — Progress Notes (Signed)
ANTICOAGULATION CONSULT NOTE  Pharmacy Consult for Heparin Indication: atrial fibrillation  No Known Allergies  Patient Measurements: Height: 5\' 11"  (180.3 cm) Weight: 264 lb 14.4 oz (120.2 kg) IBW/kg (Calculated) : 75.3 Heparin Dosing Weight: 100 kg  Vital Signs: Temp: 98.6 F (37 C) (12/20 0447) Temp Source: Oral (12/20 0447) BP: 160/103 (12/20 0447) Pulse Rate: 62 (12/20 0447)  Labs: Recent Labs    02/11/18 1842 02/11/18 2103 02/12/18 0209 02/12/18 0657  HGB 13.6  --  13.8  --   HCT 42.9  --  42.4  --   PLT 151  --  145*  --   HEPARINUNFRC  --   --   --  0.36  CREATININE 1.23  --  1.37*  --   TROPONINI  --  0.06* 0.08* 0.09*    Estimated Creatinine Clearance: 77.6 mL/min (A) (by C-G formula based on SCr of 1.37 mg/dL (H)).   Assessment: 57 y/o male with new onset Afib and elevated troponin on IV heparin. Plan is for cath this afternoon.  Heparin level is therapeutic this morning at 0.36 on 1600 units/hr. No bleeding noted, CBC is normal.  Goal of Therapy:  Heparin level 0.3-0.7 units/ml Monitor platelets by anticoagulation protocol: Yes   Plan:  Continue heparin drip at 1600 units/hr Daily heparin level and CBC Monitor for s/sx of bleeding F/U after cath - if delayed will get confirmatory heparin level   Loura BackJennifer [place], PharmD, BCPS Clinical Pharmacist Clinical phone for 02/12/2018 until 3p is [phone] 02/12/2018 8:23 AM  **Pharmacist phone directory can now be found on [email or web address] listed under [facility]**

## 2018-02-12 NOTE — Progress Notes (Signed)
  Echocardiogram 2D Echocardiogram has been performed.  Gerda Dissrthur L Chamaine Stankus 02/12/2018, 8:51 AM

## 2018-02-12 NOTE — Progress Notes (Addendum)
PROGRESS NOTE  James [name] ZOX:[id number] DOB: 1960/03/16 DOA: 02/11/2018 PCP: System, Pcp Not In  HPI/Recap of past 24 hours:  Patient was admitted for acute respiratory failure and hypertensive urgency patient seen and examined at bedside he is doing much better his blood pressure still elevated however he denies any chest pain  Assessment/Plan: Principal Problem:   Acute respiratory failure with hypoxia (HCC) Active Problems:   Hypertensive emergency   Acute pulmonary edema (HCC)   Unspecified atrial fibrillation (HCC)   Elevated troponin   Hypertensive urgency   1.  Acute respiratory failure with hypoxia secondary to pulmonary edema in the setting of hypertensive urgency.  Patient is being treated with legs his IV.  His cardiac markers are negative so far.  2.  Hypertensive urgency patient is getting clonidine hydrochlorothiazide amlodipine from home as well as as needed hydralazine.  Chest pain with elevated troponin.  Cardiology on-call was consulted and he advised the following:To summarize, I recommend addressing the precipitating factors including hypertension, OSA. Avoid beta blockers, and clonidine given his slow ventricular response. Recommend Bidil 20-37/5 mg bid to start and uptitrate as tolerated. No anticoagulation necessary for Afib. However, recommend aspirin/plavix for medical treatment of type 2 MI, along with high intensity statin.  Will arrange outpatient follow up.  Severity of Illness: The appropriate patient status for this patient is OBSERVATION. Observation status is judged to be reasonable and necessary in order to provide the required intensity of service to ensure the patient's safety. The patient's presenting symptoms, physical exam findings, and initial radiographic and laboratory data in the context of their medical condition is felt to place them at decreased risk for further clinical deterioration. Furthermore, it is anticipated that the  patient will be medically stable for discharge from the hospital within 2 midnights of admission. The following factors support the patient status of observation.   " The patient's presenting symptoms include chest pain hypoxia acute respiratory failure with hypertensive urgency which is slightly improved. " The physical exam findings include elevated blood pressure requiring PRN IV hydralazine. " The initial radiographic and laboratory data are elevated blood pressure.     DVT prophylaxis: Heparin  Code Status: Full  Family Communication: None  Disposition Plan: Home  Consults called: Neurology  Admission status: Evaluation  Antimicrobials:  None    Objective: Vitals:   02/12/18 1617 02/12/18 1633 02/12/18 1702 02/12/18 1717  BP: (!) 167/108 (!) 161/74 (!) 165/103 (!) 160/105  Pulse: 67 (!) 35 (!) 57 (!) 51  Resp:      Temp:      TempSrc:      SpO2: 98% 100% 98% 99%  Weight:      Height:        Intake/Output Summary (Last 24 hours) at 02/12/2018 1802 Last data filed at 02/12/2018 1738 Gross per 24 hour  Intake 732 ml  Output 2800 ml  Net -2068 ml   Filed Weights   02/11/18 1822 02/12/18 0002  Weight: 124.7 kg 120.2 kg   Body mass index is 36.95 kg/m.  Exam:  . General: 57 y.o. year-old male well developed well nourished in no acute distress.  Alert and oriented x3. . Cardiovascular: Regular rate and rhythm with no rubs or gallops.  No thyromegaly or JVD noted.   Marland Kitchen. Respiratory: Clear to auscultation with no wheezes or rales. Good inspiratory effort. . Abdomen: Soft nontender nondistended with normal bowel sounds x4 quadrants. . Musculoskeletal: No lower extremity edema. 2/4 pulses in all 4  extremities. . Skin: No ulcerative lesions noted or rashes, . Psychiatry: Mood is appropriate for condition and setting    Data Reviewed: CBC: Recent Labs  Lab 02/11/18 1842 02/12/18 0209  WBC 6.5 6.7  HGB 13.6 13.8  HCT 42.9 42.4  MCV 84.3 81.2  PLT 151  145*   Basic Metabolic Panel: Recent Labs  Lab 02/11/18 1842 02/12/18 0209  NA 139 140  K 3.8 3.3*  CL 106 104  CO2 22 24  GLUCOSE 94 116*  BUN 17 15  CREATININE 1.23 1.37*  CALCIUM 9.5 9.4  MG 1.8 1.9   GFR: Estimated Creatinine Clearance: 77.6 mL/min (A) (by C-G formula based on SCr of 1.37 mg/dL (H)). Liver Function Tests: No results for input(s): AST, ALT, ALKPHOS, BILITOT, PROT, ALBUMIN in the last 168 hours. No results for input(s): LIPASE, AMYLASE in the last 168 hours. No results for input(s): AMMONIA in the last 168 hours. Coagulation Profile: No results for input(s): INR, PROTIME in the last 168 hours. Cardiac Enzymes: Recent Labs  Lab 02/11/18 2103 02/12/18 0209 02/12/18 0657 02/12/18 1240  TROPONINI 0.06* 0.08* 0.09* 0.07*   BNP (last 3 results) No results for input(s): PROBNP in the last 8760 hours. HbA1C: No results for input(s): HGBA1C in the last 72 hours. CBG: No results for input(s): GLUCAP in the last 168 hours. Lipid Profile: No results for input(s): CHOL, HDL, LDLCALC, TRIG, CHOLHDL, LDLDIRECT in the last 72 hours. Thyroid Function Tests: Recent Labs    02/12/18 0209  TSH 3.626   Anemia Panel: No results for input(s): VITAMINB12, FOLATE, FERRITIN, TIBC, IRON, RETICCTPCT in the last 72 hours. Urine analysis: No results found for: COLORURINE, APPEARANCEUR, LABSPEC, PHURINE, GLUCOSEU, HGBUR, BILIRUBINUR, KETONESUR, PROTEINUR, UROBILINOGEN, NITRITE, LEUKOCYTESUR Sepsis Labs: @LABRCNTIP (procalcitonin:4,lacticidven:4)  )No results found for this or any previous visit (from the past 240 hour(s)).    Studies: Dg Chest 2 View  Result Date: 02/11/2018 CLINICAL DATA:  Atrial fibrillation EXAM: CHEST - 2 VIEW COMPARISON:  01/16/2009 FINDINGS: Mild cardiomegaly with vascular congestion and hazy bilateral opacities suspicious for pulmonary edema. Trace effusions. No pneumothorax. IMPRESSION: Mild cardiomegaly with vascular congestion and mild  pulmonary edema. Trace pleural effusions. Electronically Signed   By: Jasmine PangKim  Fujinaga M.D.   On: 02/11/2018 20:23    Scheduled Meds: . amLODipine  10 mg Oral Daily  . [START ON 02/13/2018] aspirin  81 mg Oral Daily  . atorvastatin  20 mg Oral QAC supper  . chlorthalidone  25 mg Oral Daily  . clopidogrel  75 mg Oral Daily  . isosorbide-hydrALAZINE  1 tablet Oral BID  . sodium chloride flush  3 mL Intravenous Q12H    Continuous Infusions: . sodium chloride 100 mL/hr at 02/12/18 1557  . sodium chloride    . heparin 1,600 Units/hr (02/12/18 1318)     LOS: 0 days     Myrtie NeitherNwannadiya Sharunda Salmon, MD [facility]  To reach me or the doctor on call, go to: [email or web address] Password [email or web address]  02/12/2018, 6:02 PM

## 2018-02-12 NOTE — H&P (Signed)
History and Physical    James [name] FAO:[id number] DOB: [birth year] DOA: 02/11/2018  PCP: System, Pcp Not In  Patient coming from: Home.  Chief Complaint: Abnormal EKG elevated blood pressure.  HPI: James Generarthur T Tith is a 57 y.o. male with history of hypertension and hyperlipidemia who has not been taking his antihypertensives for last 2 years had followed up with his PCP 3 days ago and was prescribed his home medications including clonidine hydrochlorothiazide and statins.  [place] [employer] [place] to check his blood pressure was found to be elevated was referred to urgent care center.  At urgent care center patient was found to be in A. fib rate controlled with elevated blood pressure was referred to the ER.  Patient states over the last 2 days patient has been having some shortness of breath on lying flat with some chest pressure.  Chest pain is mostly localized to the left anterior chest wall second and third rib.  ED Course: In the ER patient blood pressure was more than 189 x 110 EKG was showing A. fib controlled rate.  Chest x-ray shows congestion concerning for pulmonary edema.  Patient was given Lasix 40 IV and started on a heparin due to elevated troponin and new onset A. fib.  Discussed with on-call cardiologist Dr. Rosemary HolmsPatwardhan will be seeing patient in consult.  Review of Systems: As per HPI, rest all negative.   Past Medical History:  Diagnosis Date  . Hypercholesteremia   . Hypertension     History reviewed. No pertinent surgical history.   reports that he has never smoked. He has never used smokeless tobacco. He reports current alcohol use of about 1.0 standard drinks of alcohol per week. He reports that he does not use drugs.  No Known Allergies  Family History  Problem Relation Age of Onset  . Diabetes Mother   . CAD Brother     Prior to Admission medications   Medication Sig Start Date End Date Taking? Authorizing Provider  amLODipine  (NORVASC) 10 MG tablet Take 1 tablet (10 mg total) by mouth daily. 02/10/18  Yes Elvina SidleLauenstein, Kurt, MD  atorvastatin (LIPITOR) 20 MG tablet Take 1 tablet (20 mg total) by mouth daily. Patient taking differently: Take 20 mg by mouth daily before supper.  02/10/18  Yes Elvina SidleLauenstein, Kurt, MD  chlorthalidone (HYGROTON) 25 MG tablet Take 1 tablet (25 mg total) by mouth daily. 02/10/18  Yes Elvina SidleLauenstein, Kurt, MD  cloNIDine (CATAPRES) 0.2 MG tablet Take 1 tablet (0.2 mg total) by mouth 2 (two) times daily. Patient taking differently: Take 0.1 mg by mouth 2 (two) times daily.  02/10/18  Yes Elvina SidleLauenstein, Kurt, MD  aspirin EC 81 MG tablet Take 1 tablet (81 mg total) by mouth daily. Patient not taking: Reported on 02/11/2018 10/27/14   Ambrose FinlandKeck, Valerie A, NP  Cyanocobalamin (VITAMIN B-12 PO) Take 1 tablet by mouth daily.    [provider]    Physical Exam: Vitals:   02/11/18 2130 02/11/18 2248 02/11/18 2316 02/12/18 0002  BP: (!) 162/109 (!) 181/103 (!) 177/101 (!) 180/108  Pulse: 79 60  84  Resp: 20 19  18   Temp:    98.3 F (36.8 C)  TempSrc:    Oral  SpO2: 99% 96%  97%  Weight:      Height:          Constitutional: Moderately built and nourished. Vitals:   02/11/18 2130 02/11/18 2248 02/11/18 2316 02/12/18 0002  BP: (!) 162/109 Marland Kitchen(!)  181/103 (!) 177/101 (!) 180/108  Pulse: 79 60  84  Resp: 20 19  18   Temp:    98.3 F (36.8 C)  TempSrc:    Oral  SpO2: 99% 96%  97%  Weight:      Height:       Eyes: Anicteric no pallor. ENMT: No discharge from the ears eyes nose or mouth. Neck: No mass felt.  No JVD appreciated. Respiratory: No rhonchi or crepitations. Cardiovascular: S1-S2 heard. Abdomen: Soft nontender bowel sounds present. Musculoskeletal: No edema. Skin: No rash. Neurologic: Alert awake oriented to time place and person.  Moves all extremities. Psychiatric: Appears normal per normal affect.   Labs on Admission: I have personally reviewed following labs and imaging  studies  CBC: Recent Labs  Lab 02/11/18 1842  WBC 6.5  HGB 13.6  HCT 42.9  MCV 84.3  PLT 151   Basic Metabolic Panel: Recent Labs  Lab 02/11/18 1842  NA 139  K 3.8  CL 106  CO2 22  GLUCOSE 94  BUN 17  CREATININE 1.23  CALCIUM 9.5  MG 1.8   GFR: Estimated Creatinine Clearance: 88.1 mL/min (by C-G formula based on SCr of 1.23 mg/dL). Liver Function Tests: No results for input(s): AST, ALT, ALKPHOS, BILITOT, PROT, ALBUMIN in the last 168 hours. No results for input(s): LIPASE, AMYLASE in the last 168 hours. No results for input(s): AMMONIA in the last 168 hours. Coagulation Profile: No results for input(s): INR, PROTIME in the last 168 hours. Cardiac Enzymes: Recent Labs  Lab 02/11/18 2103  TROPONINI 0.06*   BNP (last 3 results) No results for input(s): PROBNP in the last 8760 hours. HbA1C: No results for input(s): HGBA1C in the last 72 hours. CBG: No results for input(s): GLUCAP in the last 168 hours. Lipid Profile: No results for input(s): CHOL, HDL, LDLCALC, TRIG, CHOLHDL, LDLDIRECT in the last 72 hours. Thyroid Function Tests: No results for input(s): TSH, T4TOTAL, FREET4, T3FREE, THYROIDAB in the last 72 hours. Anemia Panel: No results for input(s): VITAMINB12, FOLATE, FERRITIN, TIBC, IRON, RETICCTPCT in the last 72 hours. Urine analysis: No results found for: COLORURINE, APPEARANCEUR, LABSPEC, PHURINE, GLUCOSEU, HGBUR, BILIRUBINUR, KETONESUR, PROTEINUR, UROBILINOGEN, NITRITE, LEUKOCYTESUR Sepsis Labs: @LABRCNTIP (procalcitonin:4,lacticidven:4) )No results found for this or any previous visit (from the past 240 hour(s)).   Radiological Exams on Admission: Dg Chest 2 View  Result Date: 02/11/2018 CLINICAL DATA:  Atrial fibrillation EXAM: CHEST - 2 VIEW COMPARISON:  01/16/2009 FINDINGS: Mild cardiomegaly with vascular congestion and hazy bilateral opacities suspicious for pulmonary edema. Trace effusions. No pneumothorax. IMPRESSION: Mild cardiomegaly with  vascular congestion and mild pulmonary edema. Trace pleural effusions. Electronically Signed   By: Jasmine Pang M.D.   On: 02/11/2018 20:23    EKG: Independently reviewed.  A. fib rate controlled.  New onset.  Assessment/Plan Principal Problem:   Acute respiratory failure with hypoxia (HCC) Active Problems:   Hypertensive emergency   Acute pulmonary edema (HCC)   Unspecified atrial fibrillation (HCC)   Elevated troponin   Hypertensive urgency    1. Acute respiratory failure with hypoxia secondary to pulmonary edema in the setting of hypertensive urgency -Lasix 40 mg IV 1 dose was given.  Based on the response will dose further doses.  Follow intake output metabolic panel cycling cardiac markers check 2D echo.  Check daily weights. 2. Hypertensive urgency -patient has been placed on his home medications including clonidine hydrochlorothiazide and amlodipine.  I have added PRN IV hydralazine.  Closely follow blood pressure trends. 3.  Chest pain with elevated troponin discussed with cardiologist on-call Dr. Rosemary HolmsPatwardhan will be cycle cardiac markers patient is already on heparin aspirin.  Patient is on statins.  Heart rate is in the 60s follow-up beta-blockers was not added.  Keep n.p.o. in a.m. in anticipation of possible ischemic work-up.   DVT prophylaxis: Heparin infusion. Code Status: Full code. Family Communication: Discussed with patient. Disposition Plan: Home. Consults called: Cardiology. Admission status: Observation.   Eduard ClosArshad N Kakrakandy MD [facility] Pager [phone].  If 7PM-7AM, please contact night-coverage [email or web address] Password [email or web address]  02/12/2018, 1:14 AM

## 2018-02-13 DIAGNOSIS — I161 Hypertensive emergency: Secondary | ICD-10-CM | POA: Diagnosis not present

## 2018-02-13 DIAGNOSIS — I10 Essential (primary) hypertension: Secondary | ICD-10-CM

## 2018-02-13 DIAGNOSIS — J9601 Acute respiratory failure with hypoxia: Secondary | ICD-10-CM | POA: Diagnosis not present

## 2018-02-13 DIAGNOSIS — R7989 Other specified abnormal findings of blood chemistry: Secondary | ICD-10-CM

## 2018-02-13 DIAGNOSIS — J810 Acute pulmonary edema: Secondary | ICD-10-CM | POA: Diagnosis not present

## 2018-02-13 LAB — BASIC METABOLIC PANEL
Anion gap: 9 (ref 5–15)
BUN: 18 mg/dL (ref 6–20)
CO2: 23 mmol/L (ref 22–32)
Calcium: 9.2 mg/dL (ref 8.9–10.3)
Chloride: 105 mmol/L (ref 98–111)
Creatinine, Ser: 1.29 mg/dL — ABNORMAL HIGH (ref 0.61–1.24)
GFR calc Af Amer: >60 mL/min (ref >60)
GFR calc non Af Amer: >60 mL/min (ref >60)
Glucose, Bld: 98 mg/dL (ref 70–99)
Potassium: 3.9 mmol/L (ref 3.5–5.1)
Sodium: 137 mmol/L (ref 135–145)

## 2018-02-13 MED ORDER — BENAZEPRIL HCL 10 MG PO TABS
10.0000 mg | ORAL_TABLET | Freq: Every day | ORAL | Status: DC
  Administered 2018-02-13 – 2018-02-14 (×2): 10 mg via ORAL
  Filled 2018-02-13 (×2): qty 1

## 2018-02-13 MED ORDER — ISOSORB DINITRATE-HYDRALAZINE 20-37.5 MG PO TABS
2.0000 | ORAL_TABLET | Freq: Two times a day (BID) | ORAL | Status: DC
  Administered 2018-02-14: 2 via ORAL
  Filled 2018-02-13: qty 2

## 2018-02-13 MED ORDER — HYDRALAZINE HCL 20 MG/ML IJ SOLN
10.0000 mg | INTRAMUSCULAR | Status: DC | PRN
  Administered 2018-02-13: 10 mg via INTRAVENOUS
  Filled 2018-02-13: qty 1

## 2018-02-13 MED ORDER — BENAZEPRIL HCL 10 MG PO TABS: 10.0000 mg | ORAL_TABLET | Freq: Every day | ORAL | 1 refills | Status: DC

## 2018-02-13 MED ORDER — ISOSORB DINITRATE-HYDRALAZINE 20-37.5 MG PO TABS: 1.0000 | ORAL_TABLET | Freq: Two times a day (BID) | ORAL | 1 refills | Status: DC

## 2018-02-13 MED ORDER — ISOSORB DINITRATE-HYDRALAZINE 20-37.5 MG PO TABS: 2.0000 | ORAL_TABLET | Freq: Two times a day (BID) | ORAL | 1 refills | Status: DC

## 2018-02-13 MED ORDER — ASPIRIN EC 81 MG PO TBEC: 81.0000 mg | DELAYED_RELEASE_TABLET | Freq: Every day | ORAL | 2 refills | Status: DC

## 2018-02-13 NOTE — Plan of Care (Signed)

## 2018-02-13 NOTE — Progress Notes (Signed)
Received consult patient needs PCP; CM talked to patient; he stated that he has an apt 02/26/2018 with a PCP but he does not recall the name. Abelino DerrickB Lisset Ketchem Palm Endoscopy CenterRN,MHA,BSN [phone]

## 2018-02-13 NOTE — Progress Notes (Addendum)
Subjective:  No symptoms, states dyspnea is better  Objective:  Vital Signs in the last 24 hours: Temp:  [97.9 F (36.6 C)-98.6 F (37 C)] 97.9 F (36.6 C) (12/21 0941) Pulse Rate:  [35-82] 56 (12/21 0941) Resp:  [4-20] 20 (12/21 0941) BP: (124-193)/(65-112) 143/80 (12/21 0941) SpO2:  [95 %-100 %] 100 % (12/21 0941) Weight:  [117.3 kg] 117.3 kg (12/21 0501)  Intake/Output from previous day: 12/20 0701 - 12/21 0700 In: 1039.2 [P.O.:600; I.V.:439.2] Out: 1875 [Urine:1875]  Physical Exam: Blood pressure (!) 143/80, pulse (!) 56, temperature 97.9 F (36.6 C), temperature source Oral, resp. rate 20, height 5\' 11"  (1.803 m), weight 117.3 kg, SpO2 100 %.  General appearance: alert, cooperative and no distress Eyes: negative findings: lids and lashes normal Neck: no adenopathy, no carotid bruit, no JVD, supple, symmetrical, trachea midline and thyroid not enlarged, symmetric, no tenderness/mass/nodules Neck: JVP - normal, carotids 2+= without bruits Resp: clear to auscultation bilaterally Chest wall: no tenderness Cardio: regular rate and rhythm, S1, S2 normal, no murmur, click, rub or gallop GI: soft, non-tender; bowel sounds normal; no masses,  no organomegaly Extremities: extremities normal, atraumatic, no cyanosis or edema    Lab Results: BMP Recent Labs    02/11/18 1842 02/12/18 0209  NA 139 140  K 3.8 3.3*  CL 106 104  CO2 22 24  GLUCOSE 94 116*  BUN 17 15  CREATININE 1.23 1.37*  CALCIUM 9.5 9.4  GFRNONAA >60 56*  GFRAA >60 >60    CBC Recent Labs  Lab 02/12/18 0209  WBC 6.7  RBC 5.22  HGB 13.8  HCT 42.4  PLT 145*  MCV 81.2  MCH 26.4  MCHC 32.5  RDW 15.6*    HEMOGLOBIN A1C No results found for: HGBA1C, MPG  Cardiac Panel (last 3 results) Recent Labs    02/12/18 0657 02/12/18 1240 02/12/18 1908  TROPONINI 0.09* 0.07* 0.06*    BNP (last 3 results) No results for input(s): PROBNP in the last 8760 hours.  TSH Recent Labs    02/12/18 0209   TSH 3.626    Lipid Panel     Component Value Date/Time   CHOL 223 (H) 10/06/2014 1222   TRIG 151 (H) 10/06/2014 1222   HDL 29 (L) 10/06/2014 1222   CHOLHDL 7.7 (H) 10/06/2014 1222   VLDL 30 10/06/2014 1222   LDLCALC 164 (H) 10/06/2014 1222    Imaging: Imaging results have been reviewed  Cardiac Studies:  EKG 02/11/18: A. Fib with controlled ventricular response. Non specific T. LVH  Tel: 4 beat NSVT 02/13/18  Echocardiogram 02/12/2018: - Left ventricle: The cavity size was normal. There was moderateconcentric hypertrophy. Systolic function was mildly reduced. The estimated ejection fraction was in the range of 45% to 50%. Wallmotion was normal; there were no regional wall motion abnormalities. Unable to evaluate diastolic function due toatrial fibrillation. - Mitral valve: Moderately dilated annulus. There was mildregurgitation. - Left atrium: The atrium was severely dilated.  - Right ventricle: Systolic function was low normal. No evidence of pulmonary hypertension. - Right atrium: The atrium was mildly dilated.   Cath 02/12/2018: LM: Distal 20% disease LAD: Ostial 20% disease Ramus: No significant disease LCx: Dominant. No significant disease RCA: Nondominant. No significant disease  LVEDP normal  Conclusion: Mild nonobstructive coronary artery disease Type 2 MI in the setting of hypertensive urgency.   Scheduled Meds: . amLODipine  10 mg Oral Daily  . aspirin  81 mg Oral Daily  . atorvastatin  20 mg Oral  QAC supper  . chlorthalidone  25 mg Oral Daily  . clopidogrel  75 mg Oral Daily  . isosorbide-hydrALAZINE  1 tablet Oral BID  . sodium chloride flush  3 mL Intravenous Q12H   Continuous Infusions: . sodium chloride     PRN Meds:.sodium chloride, acetaminophen, hydrALAZINE, ondansetron (ZOFRAN) IV, ondansetron **OR** [DISCONTINUED] ondansetron (ZOFRAN) IV, sodium chloride flush  Assessment/Plan:  1. Dypsnea on exertion due to  uncontrolled hypertension 2. A. Fib probably chronic CHA2DS2-VASCScore: Risk Score  1,  Yearly risk of stroke  1.3. Recommendation: ASA /Anticoagulation no 3. Hypertension with hypertensive heart disease no CHF 4. NSVT probably due to hypertension. 5. Hyperlipidemia, mixed  Rec: HR is stable and can be dischraged on amlodipine and will add Benazepril 10 mg for now and f/u A. Cr in the op basis. Can be discharged and can return to work next Monday. D/C ASA and plavix. ContinuedAtorvastatin 20 mg   Yates DecampJay Camryn Quesinberry, M.D. 02/13/2018, 11:58 AM [facility] Pager: [phone] Office: [phone] If no answer: [phone]

## 2018-02-13 NOTE — Progress Notes (Signed)
Patient has a headache blood pressure is high MD paged and said to give pt Benazepril 10mg  and recheck blood pressure after couple hours so she can adjust the meds if patient still hypertensive before leaving.

## 2018-02-13 NOTE — Progress Notes (Signed)
PROGRESS NOTE    James [name]  ZOX:[id number] DOB: 02/06/1961 DOA: 02/11/2018 PCP: System, Pcp Not In    Brief Narrative:  Patient was admitted for acute respiratory failure and hypertensive urgency patient seen and examined at bedside he is doing much better his blood pressure still elevated however he denies any chest pain   Assessment & Plan:   Principal Problem:   Acute respiratory failure with hypoxia (HCC) Active Problems:   Hypertensive emergency   Acute pulmonary edema (HCC)   Unspecified atrial fibrillation (HCC)   Elevated troponin   Hypertensive urgency   Acute respiratory failure with hypoxia Probably secondary to some pulmonary congestion from hypertensive emergency. Resolved with Lasix. Underwent cardiac catheterization, showed mild nonobstructive coronary artery disease. Cardiology recommended stopping aspirin and Plavix.   Hypertensive urgency Blood pressure para beta still not well controlled.  Adjusted the medications tonight we will monitor the patient overnight and possible discharge home tomorrow if blood pressure parameters are better.   Acute pulmonary edema secondary to hypertensive emergency Resolved.    Mild elevated troponin probably secondary to demand ischemia from hypertensive emergency.  DVT prophylaxis: Lovenox Code Status: Full code Family Communication: None at bedside Disposition Plan: Discharge home when blood pressure parameters are better controlled   Consultants:   Cardiology  Procedures: Cardiac catheterization mild nonobstructive coronary artery disease  Antimicrobials: None   Subjective: Reports having a headache  Objective: Vitals:   02/13/18 0941 02/13/18 1330 02/13/18 1333 02/13/18 1759  BP: (!) 143/80 (!) 168/98 (!) 163/92 (!) 179/100  Pulse: (!) 56 (!) 59 83   Resp: 20 20 20    Temp: 97.9 F (36.6 C)     TempSrc: Oral     SpO2: 100% 94% 94%   Weight:      Height:        Intake/Output Summary  (Last 24 hours) at 02/13/2018 1850 Last data filed at 02/13/2018 1843 Gross per 24 hour  Intake 1027.17 ml  Output 2375 ml  Net -1347.83 ml   Filed Weights   02/11/18 1822 02/12/18 0002 02/13/18 0501  Weight: 124.7 kg 120.2 kg 117.3 kg    Examination:  General exam: Appears calm and comfortable  Respiratory system: Clear to auscultation. Respiratory effort normal. Cardiovascular system: S1 & S2 heard, RRR. No JVD, murmurs, rubs, gallops or clicks. No pedal edema. Gastrointestinal system: Abdomen is nondistended, soft and nontender. No organomegaly or masses felt. Normal bowel sounds heard. Central nervous system: Alert and oriented. No focal neurological deficits. Extremities: Symmetric 5 x 5 power. Skin: No rashes, lesions or ulcers Psychiatry:  Mood & affect appropriate.     Data Reviewed: I have personally reviewed following labs and imaging studies  CBC: Recent Labs  Lab 02/11/18 1842 02/12/18 0209  WBC 6.5 6.7  HGB 13.6 13.8  HCT 42.9 42.4  MCV 84.3 81.2  PLT 151 145*   Basic Metabolic Panel: Recent Labs  Lab 02/11/18 1842 02/12/18 0209 02/13/18 1229  NA 139 140 137  K 3.8 3.3* 3.9  CL 106 104 105  CO2 22 24 23   GLUCOSE 94 116* 98  BUN 17 15 18   CREATININE 1.23 1.37* 1.29*  CALCIUM 9.5 9.4 9.2  MG 1.8 1.9  --    GFR: Estimated Creatinine Clearance: 81.3 mL/min (A) (by C-G formula based on SCr of 1.29 mg/dL (H)). Liver Function Tests: No results for input(s): AST, ALT, ALKPHOS, BILITOT, PROT, ALBUMIN in the last 168 hours. No results for input(s): LIPASE, AMYLASE in the last  168 hours. No results for input(s): AMMONIA in the last 168 hours. Coagulation Profile: No results for input(s): INR, PROTIME in the last 168 hours. Cardiac Enzymes: Recent Labs  Lab 02/11/18 2103 02/12/18 0209 02/12/18 0657 02/12/18 1240 02/12/18 1908  TROPONINI 0.06* 0.08* 0.09* 0.07* 0.06*   BNP (last 3 results) No results for input(s): PROBNP in the last 8760  hours. HbA1C: No results for input(s): HGBA1C in the last 72 hours. CBG: No results for input(s): GLUCAP in the last 168 hours. Lipid Profile: No results for input(s): CHOL, HDL, LDLCALC, TRIG, CHOLHDL, LDLDIRECT in the last 72 hours. Thyroid Function Tests: Recent Labs    02/12/18 0209  TSH 3.626   Anemia Panel: No results for input(s): VITAMINB12, FOLATE, FERRITIN, TIBC, IRON, RETICCTPCT in the last 72 hours. Sepsis Labs: No results for input(s): PROCALCITON, LATICACIDVEN in the last 168 hours.  No results found for this or any previous visit (from the past 240 hour(s)).       Radiology Studies: Dg Chest 2 View  Result Date: 02/11/2018 CLINICAL DATA:  Atrial fibrillation EXAM: CHEST - 2 VIEW COMPARISON:  01/16/2009 FINDINGS: Mild cardiomegaly with vascular congestion and hazy bilateral opacities suspicious for pulmonary edema. Trace effusions. No pneumothorax. IMPRESSION: Mild cardiomegaly with vascular congestion and mild pulmonary edema. Trace pleural effusions. Electronically Signed   By: Jasmine PangKim  Fujinaga M.D.   On: 02/11/2018 20:23        Scheduled Meds: . amLODipine  10 mg Oral Daily  . atorvastatin  20 mg Oral QAC supper  . benazepril  10 mg Oral Daily  . chlorthalidone  25 mg Oral Daily  . isosorbide-hydrALAZINE  2 tablet Oral BID  . sodium chloride flush  3 mL Intravenous Q12H   Continuous Infusions: . sodium chloride       LOS: 0 days    Time spent: 42 minutes    Kathlen ModyVijaya Pearly Bartosik, MD [facility] Pager [phone]  If 7PM-7AM, please contact night-coverage [email or web address] Password [email or web address] 02/13/2018, 6:50 PM

## 2018-02-13 NOTE — Discharge Summary (Signed)
Physician Discharge Summary  Santa Generarthur T Lazard ZOX:[id number] DOB: 11-26-1960 DOA: 02/11/2018  PCP: System, Pcp Not In  Admit date: 02/11/2018 Discharge date: 02/14/2018  Admitted From: Home.  Disposition: Home.  Recommendations for Outpatient Follow-up:  1. Follow up with PCP in 1-2 weeks 2. Please obtain BMP/CBC in one week Please follow up with cardiology as recommended.   Discharge Condition:stable.  CODE STATUS: full code.  Diet recommendation: Heart Healthy    Brief/Interim Summary: Patient was admitted for acute respiratory failure and hypertensive urgency patient seen and examined at bedside he is doing much better his blood pressure still elevated however he denies any chest pain  Discharge Diagnoses:  Principal Problem:   Acute respiratory failure with hypoxia (HCC) Active Problems:   Hypertensive emergency   Acute pulmonary edema (HCC)   Unspecified atrial fibrillation (HCC)   Elevated troponin   Hypertensive urgency  Acute respiratory failure with hypoxia Probably secondary to some pulmonary congestion from hypertensive emergency. Resolved with Lasix. Underwent cardiac catheterization, showed mild nonobstructive coronary artery disease. Cardiology recommended stopping aspirin and Plavix. Pt stable on discharge.    Hypertensive urgency Improved.  Adjusted medications and prescriptions ordered on discharge.    Acute pulmonary edema secondary to hypertensive emergency Resolved.    Mild elevated troponin probably secondary to demand ischemia from hypertensive emergency.   Discharge Instructions  Discharge Instructions    Amb referral to [facility]   Complete by:  As directed    Diet - low sodium heart healthy   Complete by:  As directed    Discharge instructions   Complete by:  As directed    Please follow up with cardiology as recommended.  Please follow up with PCP in one week     Allergies as of 02/13/2018   No Known Allergies      Medication List    STOP taking these medications   aspirin EC 81 MG tablet   cloNIDine 0.2 MG tablet Commonly known as:  CATAPRES     TAKE these medications   amLODipine 10 MG tablet Commonly known as:  NORVASC Take 1 tablet (10 mg total) by mouth daily.   atorvastatin 20 MG tablet Commonly known as:  LIPITOR Take 1 tablet (20 mg total) by mouth daily. What changed:  when to take this   benazepril 10 MG tablet Commonly known as:  LOTENSIN Take 1 tablet (10 mg total) by mouth daily.   chlorthalidone 25 MG tablet Commonly known as:  HYGROTON Take 1 tablet (25 mg total) by mouth daily.   isosorbide-hydrALAZINE 20-37.5 MG tablet Commonly known as:  BIDIL Take 2 tablets by mouth 2 (two) times daily.   VITAMIN B-12 PO Take 1 tablet by mouth daily.      Follow-up Information    Yates DecampGanji, Jay, MD. Schedule an appointment as soon as possible for a visit in 2 weeks.   Specialty:  Cardiology Contact information: [address] [place] [address] [phone]          No Known Allergies  Consultations:  Cardiology.    Procedures/Studies: Dg Chest 2 View  Result Date: 02/11/2018 CLINICAL DATA:  Atrial fibrillation EXAM: CHEST - 2 VIEW COMPARISON:  01/16/2009 FINDINGS: Mild cardiomegaly with vascular congestion and hazy bilateral opacities suspicious for pulmonary edema. Trace effusions. No pneumothorax. IMPRESSION: Mild cardiomegaly with vascular congestion and mild pulmonary edema. Trace pleural effusions. Electronically Signed   By: Jasmine PangKim  Fujinaga M.D.   On: 02/11/2018 20:23    Cardiac catheterization.  Subjective: No headache, chest pain or sob.   Discharge Exam: Vitals:   02/13/18 1333 02/13/18 1759  BP: (!) 163/92 (!) 179/100  Pulse: 83   Resp: 20   Temp:    SpO2: 94%    Vitals:   02/13/18 0941 02/13/18 1330 02/13/18 1333 02/13/18 1759  BP: (!) 143/80 (!) 168/98 (!) 163/92 (!) 179/100  Pulse: (!) 56 (!) 59 83   Resp: 20 20 20    Temp: 97.9  F (36.6 C)     TempSrc: Oral     SpO2: 100% 94% 94%   Weight:      Height:        General: Pt is alert, awake, not in acute distress Cardiovascular: RRR, S1/S2 +, no rubs, no gallops Respiratory: CTA bilaterally, no wheezing, no rhonchi Abdominal: Soft, NT, ND, bowel sounds + Extremities: no edema, no cyanosis    The results of significant diagnostics from this hospitalization (including imaging, microbiology, ancillary and laboratory) are listed below for reference.     Microbiology: No results found for this or any previous visit (from the past 240 hour(s)).   Labs: BNP (last 3 results) No results for input(s): BNP in the last 8760 hours. Basic Metabolic Panel: Recent Labs  Lab 02/11/18 1842 02/12/18 0209 02/13/18 1229  NA 139 140 137  K 3.8 3.3* 3.9  CL 106 104 105  CO2 22 24 23   GLUCOSE 94 116* 98  BUN 17 15 18   CREATININE 1.23 1.37* 1.29*  CALCIUM 9.5 9.4 9.2  MG 1.8 1.9  --    Liver Function Tests: No results for input(s): AST, ALT, ALKPHOS, BILITOT, PROT, ALBUMIN in the last 168 hours. No results for input(s): LIPASE, AMYLASE in the last 168 hours. No results for input(s): AMMONIA in the last 168 hours. CBC: Recent Labs  Lab 02/11/18 1842 02/12/18 0209  WBC 6.5 6.7  HGB 13.6 13.8  HCT 42.9 42.4  MCV 84.3 81.2  PLT 151 145*   Cardiac Enzymes: Recent Labs  Lab 02/11/18 2103 02/12/18 0209 02/12/18 0657 02/12/18 1240 02/12/18 1908  TROPONINI 0.06* 0.08* 0.09* 0.07* 0.06*   BNP: Invalid input(s): POCBNP CBG: No results for input(s): GLUCAP in the last 168 hours. D-Dimer No results for input(s): DDIMER in the last 72 hours. Hgb A1c No results for input(s): HGBA1C in the last 72 hours. Lipid Profile No results for input(s): CHOL, HDL, LDLCALC, TRIG, CHOLHDL, LDLDIRECT in the last 72 hours. Thyroid function studies Recent Labs    02/12/18 0209  TSH 3.626   Anemia work up No results for input(s): VITAMINB12, FOLATE, FERRITIN, TIBC,  IRON, RETICCTPCT in the last 72 hours. Urinalysis No results found for: COLORURINE, APPEARANCEUR, LABSPEC, PHURINE, GLUCOSEU, HGBUR, BILIRUBINUR, KETONESUR, PROTEINUR, UROBILINOGEN, NITRITE, LEUKOCYTESUR Sepsis Labs Invalid input(s): PROCALCITONIN,  WBC,  LACTICIDVEN Microbiology No results found for this or any previous visit (from the past 240 hour(s)).   Time coordinating discharge: 36 minutes  SIGNED:   Kathlen ModyVijaya Jelene Albano, MD  [facility] 02/13/2018, 6:43 PM Pager   If 7PM-7AM, please contact night-coverage [email or web address] Password [email or web address]

## 2018-02-14 NOTE — Progress Notes (Signed)
Discharge instructions given to patient, all questions answered and requested a work note from MD. MD paged and is putting the letter in for me to print and give to the patient.

## 2018-02-14 NOTE — Progress Notes (Signed)
Patient is in bed with no complains, he reported that he feels much better than yesterday.

## 2018-02-14 NOTE — Progress Notes (Signed)
Pt's HR continues to drop in the 30's while pt is asleep, pt does have hx of sleep apnea and used c-pap before, may need a outpatient sleep study, we have been using O2 at night to help with this.  On call MD ordered to hold Bidil last night due to HR in the 50's.  Pt BP did go down with IV Hydralazine, may need to be on another med for BP control due to the HR, will continue to monitor, Thanks Lavonda JumboMike F RN.

## 2018-02-15 ENCOUNTER — Encounter (HOSPITAL_COMMUNITY): Payer: Self-pay | Admitting: Cardiology

## 2018-02-26 ENCOUNTER — Ambulatory Visit: Payer: 59 | Admitting: Family Medicine

## 2018-03-01 ENCOUNTER — Other Ambulatory Visit: Payer: Self-pay | Admitting: Family Medicine

## 2018-03-01 NOTE — Telephone Encounter (Signed)
Copied from CRM [id number]. Topic: Quick Communication - Rx Refill/Question >> Mar 01, 2018 12:49 PM Jens Som A wrote: Medication: isosorbide-hydrALAZINE (BIDIL) 20-37.5 MG tablet [[id number]]   Has the patient contacted their pharmacy? Yes  (Agent: If no, request that the patient contact the pharmacy for the refill.) (Agent: If yes, when and what did the pharmacy advise?)  Preferred Pharmacy (with phone number or street name): [facility] [address] ([address]), [place] - [address] [phone] (Phone) [phone] (Fax)    Agent: Please be advised that RX refills may take up to 3 business days. We ask that you follow-up with your pharmacy.

## 2018-03-02 NOTE — Telephone Encounter (Signed)
Pt called back in to follow up on refill request. Pt says that he is completely out of his medication. Pt says that his apt was rescheduled due to him arriving late to his apt. Pt says that he desperately need his medication and would like to know if Dr Salomon Fick could fill as soon as possible.

## 2018-03-04 NOTE — Telephone Encounter (Signed)
Pt is scheduled to establish care on 03/09/2018 at 4.30 pm, pt is requesting for refill on his isosorbide -hydralazine 20-37.5 mg until his appointment date. Please Advise

## 2018-03-09 ENCOUNTER — Ambulatory Visit: Payer: 59 | Admitting: Family Medicine

## 2018-03-09 ENCOUNTER — Encounter: Payer: Self-pay | Admitting: Family Medicine

## 2018-03-09 VITALS — BP 140/60 | HR 68 | Temp 98.5°F | Ht 71.0 in | Wt 263.0 lb

## 2018-03-09 DIAGNOSIS — I10 Essential (primary) hypertension: Secondary | ICD-10-CM

## 2018-03-09 DIAGNOSIS — E782 Mixed hyperlipidemia: Secondary | ICD-10-CM

## 2018-03-09 DIAGNOSIS — Z7689 Persons encountering health services in other specified circumstances: Secondary | ICD-10-CM

## 2018-03-09 MED ORDER — ISOSORB DINITRATE-HYDRALAZINE 20-37.5 MG PO TABS: 2.0000 | ORAL_TABLET | Freq: Two times a day (BID) | ORAL | 1 refills | Status: DC

## 2018-03-09 NOTE — Progress Notes (Signed)
Patient presents to clinic today to establish care.  SUBJECTIVE: PMH: Pt is a 58 yo male with pmh sig for HTN.  Pt has not had a pcp in a while.  Dx'd with HTN in 2010. Was on meds until 2016 as did not think he needed them.  Hospitalized 12/19-12/22/19 for acute respiratory failure and hypertensive urgency.  Acute respiratory failure thought 2/2 pulmonary congestion, resolved with Lasix.  Cardiac cath with mild nonobstructive coronary artery disease and type II MI in setting of hypertensive urgency. While in the ED A. fib noted, aspirin and anticoagulation not recommended as chads chads vas score 1.   Since d/c pt states he is doing well.  He is taking all his meds.  Pt states he is still taking ASA as he did not know it was d/c.   Pt is out of Bidil.  Pt has yet to f/u with Cards, states does not have an appointment.  Allergies: NKDA  Past surgical history: None  Social history: Patient used to drive trucks for a living.  Endorses gaining weight while driving.  Patient endorses alcohol use.  Patient denies tobacco and drug use.  Past Medical History:  Diagnosis Date  . Hypercholesteremia   . Hypertension     Past Surgical History:  Procedure Laterality Date  . LEFT HEART CATH AND CORONARY ANGIOGRAPHY N/A 02/12/2018   Procedure: LEFT HEART CATH AND CORONARY ANGIOGRAPHY;  Surgeon: Elder NegusPatwardhan, Manish J, MD;  Location: [facility];  Service: Cardiovascular;  Laterality: N/A;    Current Outpatient Medications on File Prior to Visit  Medication Sig Dispense Refill  . amLODipine (NORVASC) 10 MG tablet Take 1 tablet (10 mg total) by mouth daily. 90 tablet 3  . atorvastatin (LIPITOR) 20 MG tablet Take 1 tablet (20 mg total) by mouth daily. (Patient taking differently: Take 20 mg by mouth daily before supper. ) 90 tablet 3  . benazepril (LOTENSIN) 10 MG tablet Take 1 tablet (10 mg total) by mouth daily. 30 tablet 1  . chlorthalidone (HYGROTON) 25 MG tablet Take 1 tablet (25 mg  total) by mouth daily. 90 tablet 0  . Cyanocobalamin (VITAMIN B-12 PO) Take 1 tablet by mouth daily.    . isosorbide-hydrALAZINE (BIDIL) 20-37.5 MG tablet Take 2 tablets by mouth 2 (two) times daily. 60 tablet 1   No current facility-administered medications on file prior to visit.     No Known Allergies  Family History  Problem Relation Age of Onset  . Diabetes Mother   . CAD Brother     Social History   Socioeconomic History  . Marital status: Legally Separated    Spouse name: Not on file  . Number of children: Not on file  . Years of education: Not on file  . Highest education level: Not on file  Occupational History  . Not on file  Social Needs  . Financial resource strain: Not on file  . Food insecurity:    Worry: Not on file    Inability: Not on file  . Transportation needs:    Medical: Not on file    Non-medical: Not on file  Tobacco Use  . Smoking status: Never Smoker  . Smokeless tobacco: Never Used  Substance and Sexual Activity  . Alcohol use: Yes    Alcohol/week: 1.0 standard drinks    Types: 1 Standard drinks or equivalent per week  . Drug use: No  . Sexual activity: Yes  Lifestyle  . Physical activity:  Days per week: Not on file    Minutes per session: Not on file  . Stress: Not on file  Relationships  . Social connections:    Talks on phone: Not on file    Gets together: Not on file    Attends religious service: Not on file    Active member of club or organization: Not on file    Attends meetings of clubs or organizations: Not on file    Relationship status: Not on file  . Intimate partner violence:    Fear of current or ex partner: Not on file    Emotionally abused: Not on file    Physically abused: Not on file    Forced sexual activity: Not on file  Other Topics Concern  . Not on file  Social History Narrative  . Not on file    ROS General: Denies fever, chills, night sweats, changes in weight, changes in appetite HEENT: Denies  headaches, ear pain, changes in vision, rhinorrhea, sore throat CV: Denies CP, palpitations, SOB, orthopnea Pulm: Denies SOB, cough, wheezing GI: Denies abdominal pain, nausea, vomiting, diarrhea, constipation GU: Denies dysuria, hematuria, frequency, vaginal discharge Msk: Denies muscle cramps, joint pains Neuro: Denies weakness, numbness, tingling Skin: Denies rashes, bruising Psych: Denies depression, anxiety, hallucinations BP 140/60 (BP Location: Left Arm, Patient Position: Sitting, Cuff Size: Large)   Pulse 68   Temp 98.5 F (36.9 C) (Oral)   Ht 5\' 11"  (1.803 m)   Wt 263 lb (119.3 kg)   SpO2 98%   BMI 36.68 kg/m   Physical Exam Gen. Pleasant, well developed, well-nourished, in NAD HEENT - /AT, PERRL, no scleral icterus, no nasal drainage, pharynx without erythema or exudate.  TMs normal bilaterally Neck: No JVD, no thyromegaly, no carotid bruits Lungs: no use of accessory muscles, CTAB, no wheezes, rales or rhonchi Cardiovascular: RRR, No r/g/m, no peripheral edema Neuro:  A&Ox3, CN II-XII intact, normal gait Skin:  Warm, dry, intact, no lesions  Recent Results (from the past 2160 hour(s))  Basic metabolic panel     Status: None   Collection Time: 02/11/18  6:42 PM  Result Value Ref Range   Sodium 139 135 - 145 mmol/L   Potassium 3.8 3.5 - 5.1 mmol/L    Comment: SLIGHT HEMOLYSIS   Chloride 106 98 - 111 mmol/L   CO2 22 22 - 32 mmol/L   Glucose, Bld 94 70 - 99 mg/dL   BUN 17 6 - 20 mg/dL   Creatinine, Ser 4.131.23 0.61 - 1.24 mg/dL   Calcium 9.5 8.9 - 24.410.3 mg/dL   GFR calc non Af Amer >60 >60 mL/min   GFR calc Af Amer >60 >60 mL/min   Anion gap 11 5 - 15    Comment: Performed at [facility], [address] [place], [place] [address]  Magnesium     Status: None   Collection Time: 02/11/18  6:42 PM  Result Value Ref Range   Magnesium 1.8 1.7 - 2.4 mg/dL    Comment: Performed at [facility], [address] [place], [place] [address]  CBC     Status:  Abnormal   Collection Time: 02/11/18  6:42 PM  Result Value Ref Range   WBC 6.5 4.0 - 10.5 K/uL   RBC 5.09 4.22 - 5.81 MIL/uL   Hemoglobin 13.6 13.0 - 17.0 g/dL   HCT 44.042.9 34.739.0 - 42.552.0 %   MCV 84.3 80.0 - 100.0 fL   MCH 26.7 26.0 - 34.0  pg   MCHC 31.7 30.0 - 36.0 g/dL   RDW 16.1 (H) 09.6 - 04.5 %   Platelets 151 150 - 400 K/uL   nRBC 0.0 0.0 - 0.2 %    Comment: Performed at [facility], [address], [place], [place] [address]  Troponin I - Once     Status: Abnormal   Collection Time: 02/11/18  9:03 PM  Result Value Ref Range   Troponin I 0.06 (HH) <0.03 ng/mL    Comment: CRITICAL RESULT CALLED TO, READ BACK BY AND VERIFIED WITH: STRAUGHAN C,RN 02/11/18 2203 WAYK Performed at [facility], [address] [place], [place] [address]   HIV antibody (Routine Testing)     Status: None   Collection Time: 02/12/18  2:09 AM  Result Value Ref Range   HIV Screen 4th Generation wRfx Non Reactive Non Reactive    Comment: (NOTE) Performed At: [facility] [address] [place], [place] [id number] Jolene Schimke MD QM:[id number]   Basic metabolic panel     Status: Abnormal   Collection Time: 02/12/18  2:09 AM  Result Value Ref Range   Sodium 140 135 - 145 mmol/L   Potassium 3.3 (L) 3.5 - 5.1 mmol/L   Chloride 104 98 - 111 mmol/L   CO2 24 22 - 32 mmol/L   Glucose, Bld 116 (H) 70 - 99 mg/dL   BUN 15 6 - 20 mg/dL   Creatinine, Ser 2.84 (H) 0.61 - 1.24 mg/dL   Calcium 9.4 8.9 - 13.2 mg/dL   GFR calc non Af Amer 56 (L) >60 mL/min   GFR calc Af Amer >60 >60 mL/min   Anion gap 12 5 - 15    Comment: Performed at [facility], [address], [place], [place] [address]  CBC     Status: Abnormal   Collection Time: 02/12/18  2:09 AM  Result Value Ref Range   WBC 6.7 4.0 - 10.5 K/uL   RBC 5.22 4.22 - 5.81 MIL/uL   Hemoglobin 13.8 13.0 - 17.0 g/dL   HCT 27.2 53.6 - 64.4 %   MCV 81.2 80.0 - 100.0 fL   MCH 26.4 26.0 - 34.0 pg   MCHC 32.5 30.0 - 36.0 g/dL   RDW  03.4 (H) 74.2 - 15.5 %   Platelets 145 (L) 150 - 400 K/uL   nRBC 0.0 0.0 - 0.2 %    Comment: Performed at [facility], [address], [place], [place] [address]  Magnesium     Status: None   Collection Time: 02/12/18  2:09 AM  Result Value Ref Range   Magnesium 1.9 1.7 - 2.4 mg/dL    Comment: Performed at [facility], [address] [place], [place] [address]  TSH     Status: None   Collection Time: 02/12/18  2:09 AM  Result Value Ref Range   TSH 3.626 0.350 - 4.500 uIU/mL    Comment: Performed by a 3rd Generation assay with a functional sensitivity of <=0.01 uIU/mL. Performed at [facility], [address], [place], [place] [address]   Troponin I - Now Then Q6H     Status: Abnormal   Collection Time: 02/12/18  2:09 AM  Result Value Ref Range   Troponin I 0.08 (HH) <0.03 ng/mL    Comment: CRITICAL VALUE NOTED.  VALUE IS CONSISTENT WITH PREVIOUSLY REPORTED AND CALLED VALUE. Performed at  Hospital Lab, [address], [place], [place] [address]  Heparin level (unfractionated)     Status: None   Collection Time: 02/12/18  6:57 AM  Result Value Ref Range   Heparin Unfractionated 0.36 0.30 - 0.70 IU/mL    Comment: (NOTE) If heparin results are below expected values, and patient dosage has  been confirmed, suggest follow up testing of antithrombin III levels. Performed at [facility], [address], [place], [place] [address]   Troponin I - Now Then Q6H     Status: Abnormal   Collection Time: 02/12/18  6:57 AM  Result Value Ref Range   Troponin I 0.09 (HH) <0.03 ng/mL    Comment: CRITICAL VALUE NOTED.  VALUE IS CONSISTENT WITH PREVIOUSLY REPORTED AND CALLED VALUE. Performed at [facility], [address] [place], [place] [address]   ECHOCARDIOGRAM COMPLETE     Status: None   Collection Time: 02/12/18  8:51 AM  Result Value Ref Range   Weight 4,238.4 oz   Height 71 in   BP 160/103 mmHg  Troponin I - Once     Status: Abnormal    Collection Time: 02/12/18 12:40 PM  Result Value Ref Range   Troponin I 0.07 (HH) <0.03 ng/mL    Comment: CRITICAL VALUE NOTED.  VALUE IS CONSISTENT WITH PREVIOUSLY REPORTED AND CALLED VALUE. Performed at [facility], [address] [place], [place] [address]   Troponin I - Now Then Q6H     Status: Abnormal   Collection Time: 02/12/18  7:08 PM  Result Value Ref Range   Troponin I 0.06 (HH) <0.03 ng/mL    Comment: CRITICAL VALUE NOTED.  VALUE IS CONSISTENT WITH PREVIOUSLY REPORTED AND CALLED VALUE. Performed at [facility], [address], [place], [place] [address]   Basic metabolic panel     Status: Abnormal   Collection Time: 02/13/18 12:29 PM  Result Value Ref Range   Sodium 137 135 - 145 mmol/L   Potassium 3.9 3.5 - 5.1 mmol/L   Chloride 105 98 - 111 mmol/L   CO2 23 22 - 32 mmol/L   Glucose, Bld 98 70 - 99 mg/dL   BUN 18 6 - 20 mg/dL   Creatinine, Ser 3.01 (H) 0.61 - 1.24 mg/dL   Calcium 9.2 8.9 - 60.1 mg/dL   GFR calc non Af Amer >60 >60 mL/min   GFR calc Af Amer >60 >60 mL/min   Anion gap 9 5 - 15    Comment: Performed at [facility], [address] [place], [place] [address]    Assessment/Plan: Essential hypertension  -elevated.  Possible due to pt being out of bidil -bidil refilled -continue Norvasc 10 mg, benazepril 10 mg, chlorthalidone 25 mg -Given handouts - Plan: CBC with Differential/Platelet, Basic metabolic panel, isosorbide-hydrALAZINE (BIDIL) 20-37.5 MG tablet  HLD -Continue Lipitor 20 mg -Lifestyle modification strongly encouraged. -Patient advised to contact cardiology for appointment information. -discussed obtaining fasting lipid panel.  Encounter to establish care -We reviewed the PMH, PSH, FH, SH, Meds and Allergies. -We provided refills for any medications we will prescribe as needed. -We addressed current concerns per orders and patient instructions. -We have asked for records for pertinent exams, studies, vaccines and  notes from previous providers. -We have advised patient to follow up per instructions below.  F/u prn.  Encouraged to schedule a CPE.  Abbe Amsterdam, MD

## 2018-03-09 NOTE — Patient Instructions (Addendum)
Please remember to call the Cardiologist office Dr. Yates DecampJay Ganji [phone] regarding your appt.     You also need to get a blood pressure cuff so you can monitory your blood pressure at home.   Managing Your Hypertension Hypertension is commonly called high blood pressure. This is when the force of your blood pressing against the walls of your arteries is too strong. Arteries are blood vessels that carry blood from your heart throughout your body. Hypertension forces the heart to work harder to pump blood, and may cause the arteries to become narrow or stiff. Having untreated or uncontrolled hypertension can cause heart attack, stroke, kidney disease, and other problems. What are blood pressure readings? A blood pressure reading consists of a higher number over a lower number. Ideally, your blood pressure should be below 120/80. The first ("top") number is called the systolic pressure. It is a measure of the pressure in your arteries as your heart beats. The second ("bottom") number is called the diastolic pressure. It is a measure of the pressure in your arteries as the heart relaxes. What does my blood pressure reading mean? Blood pressure is classified into four stages. Based on your blood pressure reading, your health care provider may use the following stages to determine what type of treatment you need, if any. Systolic pressure and diastolic pressure are measured in a unit called mm Hg. Normal  Systolic pressure: below 120.  Diastolic pressure: below 80. Elevated  Systolic pressure: 120-129.  Diastolic pressure: below 80. Hypertension stage 1  Systolic pressure: 130-139.  Diastolic pressure: 80-89. Hypertension stage 2  Systolic pressure: 140 or above.  Diastolic pressure: 90 or above. What health risks are associated with hypertension? Managing your hypertension is an important responsibility. Uncontrolled hypertension can lead to:  A heart attack.  A stroke.  A  weakened blood vessel (aneurysm).  Heart failure.  Kidney damage.  Eye damage.  Metabolic syndrome.  Memory and concentration problems. What changes can I make to manage my hypertension? Hypertension can be managed by making lifestyle changes and possibly by taking medicines. Your health care provider will help you make a plan to bring your blood pressure within a normal range. Eating and drinking   Eat a diet that is high in fiber and potassium, and low in salt (sodium), added sugar, and fat. An example eating plan is called the DASH (Dietary Approaches to Stop Hypertension) diet. To eat this way: ? Eat plenty of fresh fruits and vegetables. Try to fill half of your plate at each meal with fruits and vegetables. ? Eat whole grains, such as whole wheat pasta, brown rice, or whole grain bread. Fill about one quarter of your plate with whole grains. ? Eat low-fat diary products. ? Avoid fatty cuts of meat, processed or cured meats, and poultry with skin. Fill about one quarter of your plate with lean proteins such as fish, chicken without skin, beans, eggs, and tofu. ? Avoid premade and processed foods. These tend to be higher in sodium, added sugar, and fat.  Reduce your daily sodium intake. Most people with hypertension should eat less than 1,500 mg of sodium a day.  Limit alcohol intake to no more than 1 drink a day for nonpregnant women and 2 drinks a day for men. One drink equals 12 oz of beer, 5 oz of wine, or 1 oz of hard liquor. Lifestyle  Work with your health care provider to maintain a healthy body weight, or to lose weight. Ask what  an ideal weight is for you.  Get at least 30 minutes of exercise that causes your heart to beat faster (aerobic exercise) most days of the week. Activities may include walking, swimming, or biking.  Include exercise to strengthen your muscles (resistance exercise), such as weight lifting, as part of your weekly exercise routine. Try to do these  types of exercises for 30 minutes at least 3 days a week.  Do not use any products that contain nicotine or tobacco, such as cigarettes and e-cigarettes. If you need help quitting, ask your health care provider.  Control any long-term (chronic) conditions you have, such as high cholesterol or diabetes. Monitoring  Monitor your blood pressure at home as told by your health care provider. Your personal target blood pressure may vary depending on your medical conditions, your age, and other factors.  Have your blood pressure checked regularly, as often as told by your health care provider. Working with your health care provider  Review all the medicines you take with your health care provider because there may be side effects or interactions.  Talk with your health care provider about your diet, exercise habits, and other lifestyle factors that may be contributing to hypertension.  Visit your health care provider regularly. Your health care provider can help you create and adjust your plan for managing hypertension. Will I need medicine to control my blood pressure? Your health care provider may prescribe medicine if lifestyle changes are not enough to get your blood pressure under control, and if:  Your systolic blood pressure is 130 or higher.  Your diastolic blood pressure is 80 or higher. Take medicines only as told by your health care provider. Follow the directions carefully. Blood pressure medicines must be taken as prescribed. The medicine does not work as well when you skip doses. Skipping doses also puts you at risk for problems. Contact a health care provider if:  You think you are having a reaction to medicines you have taken.  You have repeated (recurrent) headaches.  You feel dizzy.  You have swelling in your ankles.  You have trouble with your vision. Get help right away if:  You develop a severe headache or confusion.  You have unusual weakness or numbness, or you  feel faint.  You have severe pain in your chest or abdomen.  You vomit repeatedly.  You have trouble breathing. Summary  Hypertension is when the force of blood pumping through your arteries is too strong. If this condition is not controlled, it may put you at risk for serious complications.  Your personal target blood pressure may vary depending on your medical conditions, your age, and other factors. For most people, a normal blood pressure is less than 120/80.  Hypertension is managed by lifestyle changes, medicines, or both. Lifestyle changes include weight loss, eating a healthy, low-sodium diet, exercising more, and limiting alcohol. This information is not intended to replace advice given to you by your health care provider. Make sure you discuss any questions you have with your health care provider. Document Released: 11/05/2011 Document Revised: 01/09/2016 Document Reviewed: 01/09/2016 Elsevier Interactive Patient Education  2019 Elsevier Inc.  Heart Disease Prevention Heart disease is the leading cause of death in the world. Coronary artery disease is the most common cause of heart disease. This condition results when cholesterol and other substances (plaque) build up inside the walls of the blood vessels that supply your heart muscle (arteries). This buildup in arteries is called atherosclerosis. You can take actions  to lower your risk of heart disease. How can heart disease affect me? Heart disease can cause many unpleasant symptoms and complications, such as:  Chest pain (angina).  Reduced or blocked blood flow to your heart. This can cause: ? Irregular heartbeats (arrhythmias). ? Heart attack. ? Heart failure. What can increase my risk? The following factors may make you more likely to develop this condition:  High blood pressure (hypertension).  High cholesterol.  Smoking.  A diet high in saturated fats or trans fats.  Lack of physical  activity.  Obesity.  Drinking too much alcohol.  Diabetes.  Having a family history of heart disease. What actions can I take to prevent heart disease? Nutrition   Eat a heart-healthy eating plan as told by your health care provider. Examples include the DASH (Dietary Approaches to Stop Hypertension) eating plan or the Mediterranean diet.  Generally, it is recommended that you: ? Eat less salt (sodium). Ask your health care provider how much sodium is safe for you. Most people should have less than 2,300 mg each day. ? Limit unhealthy fats, such as saturated and trans fats, in your diet. You can do this by eating low-fat dairy products, eating less red meat, and avoiding processed foods. ? Eat healthy fats (omega-3 fatty acids). These are found in fish, such as mackerel or salmon. ? Eat more fruits and vegetables. You should try to fill one-half of your plate with fruits and vegetables at each meal. ? Eat more whole grains. ? Avoid foods and drinks that have added sugars. Lifestyle   Get regular exercise. This is one of the most important things you can do for your health. Generally, it is recommended that you: ? Exercise for at least 30 minutes on most days of the week (150 minutes each week). The exercise should increase your heart rate and make you sweat (aerobic exercise). ? Add strength exercises on at least 2 days each week.  Do not use any products that contain nicotine or tobacco, such as cigarettes and e-cigarettes. These can damage your heart and blood vessels. If you need help quitting, ask your health care provider. Alcohol use  Do not drink alcohol if: ? Your health care provider tells you not to drink. ? You are pregnant, may be pregnant, or are planning to become pregnant.  If you drink alcohol, limit how much you have: ? 0-1 drink a day for women. ? 0-2 drinks a day for men.  Be aware of how much alcohol is in your drink. In the [place] one drink equals one  typical bottle of beer (12 oz), one-half glass of wine (5 oz), or one shot of hard liquor (1 oz). Medicines  Take over-the-counter and prescription medicines only as told by your health care provider.  Ask your health care provider whether you should take an aspirin every day. Taking aspirin may help reduce your risk of heart disease and stroke.  Depending on your risk factors, your health care provider may prescribe medicines to lower your risk of heart disease or to control related conditions. You may take medicine to: ? Lower cholesterol. ? Control blood pressure. ? Control diabetes. General information  Keep your blood pressure under control, as recommended by your health care provider. For most healthy people, the upper number of your blood pressure (systolic) should be no higher than 120, and the lower number (diastolic) no higher than 80. Treatment may be needed if your blood pressure is higher than 130/80.  Have your  blood pressure checked at least every two years. Your health care provider may check your blood pressure more often if you have high blood pressure.  After age 3, have your cholesterol checked every 4-6 years. If you have risk factors for heart disease, you may need to have it checked more frequently. Treatment may be needed if your cholesterol is high.  Have your body mass index (BMI) checked every year. Your health care provider can calculate your BMI from your height and weight.  Work with your health care provider to lose weight, if needed, or to maintain a healthy weight. Where to find more information:  Centers for Disease Control and Prevention: https://ball-collins.biz/  American Heart Association: www.heart.org ? Take a free online heart disease risk quiz to better understand your personal risk factors. Summary  Heart disease is the leading cause of death in the world.  Heart disease can cause chest pain, abnormal heart rhythms, heart attack, and heart  failure.  High blood pressure, high cholesterol, and smoking are the main risk factors for heart disease, although other factors also contribute.  You can take actions to lower your chances of developing heart disease. Work with your health care provider to reduce your risk by following a heart-healthy diet, being physically active, and controlling your weight, blood pressure, and cholesterol level. This information is not intended to replace advice given to you by your health care provider. Make sure you discuss any questions you have with your health care provider. Document Released: 09/25/2003 Document Revised: 02/25/2017 Document Reviewed: 02/25/2017 Elsevier Interactive Patient Education  2019 [employer].  How to Take Your Blood Pressure You can take your blood pressure at home with a machine. You may need to check your blood pressure at home:  To check if you have high blood pressure (hypertension).  To check your blood pressure over time.  To make sure your blood pressure medicine is working. Supplies needed: You will need a blood pressure machine, or monitor. You can buy one at a drugstore or online. When choosing one:  Choose one with an arm cuff.  Choose one that wraps around your upper arm. Only one finger should fit between your arm and the cuff.  Do not choose one that measures your blood pressure from your wrist or finger. Your doctor can suggest a monitor. How to prepare Avoid these things for 30 minutes before checking your blood pressure:  Drinking caffeine.  Drinking alcohol.  Eating.  Smoking.  Exercising. Five minutes before checking your blood pressure:  Pee.  Sit in a dining chair. Avoid sitting in a soft couch or armchair.  Be quiet. Do not talk. How to take your blood pressure Follow the instructions that came with your machine. If you have a digital blood pressure monitor, these may be the instructions: 1. Sit up straight. 2. Place your feet  on the floor. Do not cross your ankles or legs. 3. Rest your left arm at the level of your heart. You may rest it on a table, desk, or chair. 4. Pull up your shirt sleeve. 5. Wrap the blood pressure cuff around the upper part of your left arm. The cuff should be 1 inch (2.5 cm) above your elbow. It is best to wrap the cuff around bare skin. 6. Fit the cuff snugly around your arm. You should be able to place only one finger between the cuff and your arm. 7. Put the cord inside the groove of your elbow. 8. Press the power  button. 9. Sit quietly while the cuff fills with air and loses air. 10. Write down the numbers on the screen. 11. Wait 2-3 minutes and then repeat steps 1-10. What do the numbers mean? Two numbers make up your blood pressure. The first number is called systolic pressure. The second is called diastolic pressure. An example of a blood pressure reading is "120 over 80" (or 120/80). If you are an adult and do not have a medical condition, use this guide to find out if your blood pressure is normal: Normal  First number: below 120.  Second number: below 80. Elevated  First number: 120-129.  Second number: below 80. Hypertension stage 1  First number: 130-139.  Second number: 80-89. Hypertension stage 2  First number: 140 or above.  Second number: 90 or above. Your blood pressure is above normal even if only the top or bottom number is above normal. Follow these instructions at home:  Check your blood pressure as often as your doctor tells you to.  Take your monitor to your next doctor's appointment. Your doctor will: ? Make sure you are using it correctly. ? Make sure it is working right.  Make sure you understand what your blood pressure numbers should be.  Tell your doctor if your medicines are causing side effects. Contact a doctor if:  Your blood pressure keeps being high. Get help right away if:  Your first blood pressure number is higher than  180.  Your second blood pressure number is higher than 120. This information is not intended to replace advice given to you by your health care provider. Make sure you discuss any questions you have with your health care provider. Document Released: 01/24/2008 Document Revised: 01/09/2016 Document Reviewed: 07/20/2015 Elsevier Interactive Patient Education  2019 [employer].

## 2018-03-10 LAB — CBC WITH DIFFERENTIAL/PLATELET
BASOS PCT: 0.8 % (ref 0.0–3.0)
Basophils Absolute: 0 10*3/uL (ref 0.0–0.1)
EOS PCT: 1.7 % (ref 0.0–5.0)
Eosinophils Absolute: 0.1 10*3/uL (ref 0.0–0.7)
HCT: 41.9 % (ref 39.0–52.0)
Hemoglobin: 13.9 g/dL (ref 13.0–17.0)
Lymphocytes Relative: 38.4 % (ref 12.0–46.0)
Lymphs Abs: 2 10*3/uL (ref 0.7–4.0)
MCHC: 33.1 g/dL (ref 30.0–36.0)
MCV: 82.5 fl (ref 78.0–100.0)
Monocytes Absolute: 0.6 10*3/uL (ref 0.1–1.0)
Monocytes Relative: 10.6 % (ref 3.0–12.0)
Neutro Abs: 2.6 10*3/uL (ref 1.4–7.7)
Neutrophils Relative %: 48.5 % (ref 43.0–77.0)
Platelets: 130 10*3/uL — ABNORMAL LOW (ref 150.0–400.0)
RBC: 5.08 Mil/uL (ref 4.22–5.81)
RDW: 16.1 % — ABNORMAL HIGH (ref 11.5–15.5)
WBC: 5.3 10*3/uL (ref 4.0–10.5)

## 2018-03-10 LAB — BASIC METABOLIC PANEL
BUN: 26 mg/dL — ABNORMAL HIGH (ref 6–23)
CO2: 28 mEq/L (ref 19–32)
Calcium: 9.5 mg/dL (ref 8.4–10.5)
Chloride: 101 mEq/L (ref 96–112)
Creatinine, Ser: 1.29 mg/dL (ref 0.40–1.50)
GFR: 73.52 mL/min (ref >60.00)
GLUCOSE: 90 mg/dL (ref 70–99)
Potassium: 3.5 mEq/L (ref 3.5–5.1)
Sodium: 138 mEq/L (ref 135–145)

## 2018-03-11 ENCOUNTER — Encounter: Payer: Self-pay | Admitting: Family Medicine

## 2018-03-16 ENCOUNTER — Telehealth: Payer: Self-pay

## 2018-03-16 MED FILL — BIDIL TABLET: 20-37.5 | 30 days supply | Qty: 120 | Fill #0

## 2018-03-16 NOTE — Telephone Encounter (Signed)
Rx refills at the pt pharmacy spoke with [facility] and pt is aware to pick up

## 2018-03-16 NOTE — Telephone Encounter (Signed)
Spoke with pt voiced understanding that his Rx refills are at [facility] pharmacy.

## 2018-03-16 NOTE — Telephone Encounter (Signed)
Copied from CRM [id number]. Topic: General - Other >> Mar 16, 2018  1:25 PM Jilda Roche wrote: Reason for CRM: Patient states that he was talking to [place] and that she was going to call him back, Zenon Mayo advised to send a Crm for her to return his call she is in a room with a patient  Call back is [phone]

## 2018-03-16 NOTE — Telephone Encounter (Signed)
Copied from CRM [id number]. Topic: General - Other >> Mar 16, 2018 11:01 AM Arlyss Gandy, NT wrote: Reason for CRM: Pt requesting a nurse to call him to discuss med refills. He states all his meds were suppose to be refilled on 03/09/2018 and they were not.  Please advise.

## 2018-04-12 ENCOUNTER — Ambulatory Visit: Payer: Self-pay | Admitting: Cardiology

## 2018-04-13 ENCOUNTER — Other Ambulatory Visit: Payer: Self-pay | Admitting: Family Medicine

## 2018-04-13 DIAGNOSIS — I10 Essential (primary) hypertension: Secondary | ICD-10-CM

## 2018-04-13 DIAGNOSIS — E7800 Pure hypercholesterolemia, unspecified: Secondary | ICD-10-CM

## 2018-04-13 NOTE — Telephone Encounter (Signed)
Copied from CRM [id number]. Topic: Quick Communication - Rx Refill/Question >> Apr 13, 2018 11:03 AM Wyonia Hough E wrote: Medication: amLODipine (NORVASC) 10 MG tablet   atorvastatin (LIPITOR) 20 MG tablet   benazepril (LOTENSIN) 10 MG tablet  chlorthalidone (HYGROTON) 25 MG tablet  isosorbide-hydrALAZINE (BIDIL) 20-37.5 MG tablet  Has the patient contacted their pharmacy? No   Preferred Pharmacy (with phone number or street name): [facility] - [place], [place] - [address]. [phone] (Phone) [phone] (Fax)    Agent: Please be advised that RX refills may take up to 3 business days. We ask that you follow-up with your pharmacy.

## 2018-04-13 NOTE — Telephone Encounter (Signed)
Requested medication (s) are due for refill today:  yes  Requested medication (s) are on the active medication list:  yes  Future visit scheduled:  no  Last Refill: Benazepril; 02/13/18; # 30; no refills  (ordered by Hospital provider)                   Chlorthalidone; 02/10/18; #90; no refills (ordered by Hospital provider)                    Isosorbide-Hydralazine; #120; RF x 1  (low platelets 03/09/18)  **Please note that pt. wants to have med refills sent to [facility] on [address]                    Requested Prescriptions  Pending Prescriptions Disp Refills   benazepril (LOTENSIN) 10 MG tablet 90 tablet 0    Sig: Take 1 tablet (10 mg total) by mouth daily.     Cardiovascular:  ACE Inhibitors Failed - 04/13/2018 11:07 AM      Failed - Last BP in normal range    BP Readings from Last 1 Encounters:  03/09/18 140/60         Passed - Cr in normal range and within 180 days    Creat  Date Value Ref Range Status  10/06/2014 1.41 (H) 0.70 - 1.33 mg/dL Final   Creatinine, Ser  Date Value Ref Range Status  03/09/2018 1.29 0.40 - 1.50 mg/dL Final         Passed - K in normal range and within 180 days    Potassium  Date Value Ref Range Status  03/09/2018 3.5 3.5 - 5.1 mEq/L Final         Passed - Patient is not pregnant      Passed - Valid encounter within last 6 months    Recent Outpatient Visits          1 month ago Essential hypertension   Nature conservation officer at [employer], Bettey Mare, MD      Future Appointments            In 1 week Patwardhan, Anabel Bene, MD [facility]          chlorthalidone (HYGROTON) 25 MG tablet 90 tablet 0    Sig: Take 1 tablet (25 mg total) by mouth daily.     Cardiovascular: Diuretics - Thiazide Failed - 04/13/2018 11:07 AM      Failed - Last BP in normal range    BP Readings from Last 1 Encounters:  03/09/18 140/60         Passed - Ca in normal range and within 360 days    Calcium  Date Value Ref Range  Status  03/09/2018 9.5 8.4 - 10.5 mg/dL Final         Passed - Cr in normal range and within 360 days    Creat  Date Value Ref Range Status  10/06/2014 1.41 (H) 0.70 - 1.33 mg/dL Final   Creatinine, Ser  Date Value Ref Range Status  03/09/2018 1.29 0.40 - 1.50 mg/dL Final         Passed - K in normal range and within 360 days    Potassium  Date Value Ref Range Status  03/09/2018 3.5 3.5 - 5.1 mEq/L Final         Passed - Na in normal range and within 360 days    Sodium  Date Value Ref Range Status  03/09/2018  138 135 - 145 mEq/L Final         Passed - Valid encounter within last 6 months    Recent Outpatient Visits          1 month ago Essential hypertension   Nature conservation officer at [employer], Bettey Mare, MD      Future Appointments            In 1 week Patwardhan, Anabel Bene, MD [facility]          isosorbide-hydrALAZINE (BIDIL) 20-37.5 MG tablet 120 tablet 1    Sig: Take 2 tablets by mouth 2 (two) times daily.     Cardiovascular:  Vasodilators Failed - 04/13/2018 11:07 AM      Failed - PLT in normal range and within 360 days    Platelets  Date Value Ref Range Status  03/09/2018 130.0 (L) 150.0 - 400.0 K/uL Final         Failed - Last BP in normal range    BP Readings from Last 1 Encounters:  03/09/18 140/60         Passed - HCT in normal range and within 360 days    HCT  Date Value Ref Range Status  03/09/2018 41.9 39.0 - 52.0 % Final         Passed - HGB in normal range and within 360 days    Hemoglobin  Date Value Ref Range Status  03/09/2018 13.9 13.0 - 17.0 g/dL Final         Passed - RBC in normal range and within 360 days    RBC  Date Value Ref Range Status  03/09/2018 5.08 4.22 - 5.81 Mil/uL Final         Passed - WBC in normal range and within 360 days    WBC  Date Value Ref Range Status  03/09/2018 5.3 4.0 - 10.5 K/uL Final         Passed - Valid encounter within last 12 months    Recent Outpatient Visits           1 month ago Essential hypertension   Nature conservation officer at [employer], Bettey Mare, MD      Future Appointments            In 1 week Patwardhan, Anabel Bene, MD [facility]         Refused Prescriptions Disp Refills   amLODipine (NORVASC) 10 MG tablet 90 tablet 3    Sig: Take 1 tablet (10 mg total) by mouth daily.     Cardiovascular:  Calcium Channel Blockers Failed - 04/13/2018 11:07 AM      Failed - Last BP in normal range    BP Readings from Last 1 Encounters:  03/09/18 140/60         Passed - Valid encounter within last 6 months    Recent Outpatient Visits          1 month ago Essential hypertension   Nature conservation officer at [employer], Bettey Mare, MD      Future Appointments            In 1 week Patwardhan, Anabel Bene, MD [facility]

## 2018-04-13 NOTE — Telephone Encounter (Signed)
Phone call to pt. To discuss his refill requests and preferred pharmacy.  Advised that he has 3 refills left on Amlodipine at the [facility] on [address].  Questioned if he wants his Rx transferred to [facility].  Stated it would be okay to send all his refills to the [facility] on [address].

## 2018-04-16 MED ORDER — ISOSORB DINITRATE-HYDRALAZINE 20-37.5 MG PO TABS: 2.0000 | ORAL_TABLET | Freq: Two times a day (BID) | ORAL | 1 refills | Status: DC

## 2018-04-16 MED ORDER — CHLORTHALIDONE 25 MG PO TABS: 25.0000 mg | ORAL_TABLET | Freq: Every day | ORAL | 0 refills | Status: DC

## 2018-04-16 MED ORDER — BENAZEPRIL HCL 10 MG PO TABS: 10.0000 mg | ORAL_TABLET | Freq: Every day | ORAL | 0 refills | Status: DC

## 2018-04-21 ENCOUNTER — Encounter: Payer: Self-pay | Admitting: Cardiology

## 2018-04-21 ENCOUNTER — Ambulatory Visit: Payer: 59 | Admitting: Cardiology

## 2018-04-21 VITALS — BP 149/91 | HR 62 | Ht 71.0 in | Wt 266.0 lb

## 2018-04-21 DIAGNOSIS — I10 Essential (primary) hypertension: Secondary | ICD-10-CM | POA: Diagnosis not present

## 2018-04-21 DIAGNOSIS — R0683 Snoring: Secondary | ICD-10-CM

## 2018-04-21 DIAGNOSIS — I4819 Other persistent atrial fibrillation: Secondary | ICD-10-CM

## 2018-04-21 MED ORDER — BENAZEPRIL HCL 20 MG PO TABS: 10.0000 mg | ORAL_TABLET | Freq: Every day | ORAL | 3 refills | Status: DC

## 2018-04-21 NOTE — Progress Notes (Signed)
Patient is here for follow up visit.  Subjective:   James [name], male    DOB: September 07, 1960, 58 y.o.   MRN: [id number]   Chief Complaint  Patient presents with  . 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 He was found to have new diagnosis of atrial fibrillation with slow ventricular response and troponin elevation.  cath showed minimal nonobstructive CAD. Troponin elevation was thought to be type 2 MI in the setting of hypertensive urgency. Anticoagulation was not recommende given low CHA2DS2VAsc score of 1.   Patient is here for hospital follow up visit and to establish cardiac care.  Patient denies any further episodes of chest pain, shortness of breath, dizziness.  He is trying to be more compliant with his diet and medications.  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  He endorses snoring at night, but has not had a sleep study.  He questions if he could use his mother's CPAP machine.  Blood pressure improved, but remains suboptimal.   Past Medical History:  Diagnosis Date  . Hypercholesteremia   . Hypertension      Past Surgical History:  Procedure Laterality Date  . LEFT HEART CATH AND CORONARY ANGIOGRAPHY N/A 02/12/2018   Procedure: LEFT HEART CATH AND CORONARY ANGIOGRAPHY;  Surgeon: Elder Negus, MD;  Location: [facility];  Service: Cardiovascular;  Laterality: N/A;     Social History   Socioeconomic History  . Marital status: Legally Separated    Spouse name: Not on file  . Number of children: 0  . Years of education: Not on file  . Highest education level: Not on file  Occupational History  . Not on file  Social Needs  . Financial resource strain: Not on file  . Food insecurity:    Worry: Not on file    Inability:  Not on file  . Transportation needs:    Medical: Not on file    Non-medical: Not on file  Tobacco Use  . Smoking status: Never Smoker  . Smokeless tobacco: Never Used  Substance and Sexual Activity  . Alcohol use: Yes    Alcohol/week: 1.0 standard drinks    Types: 1 Standard drinks or equivalent per week  . Drug use: No  . Sexual activity: Yes  Lifestyle  . Physical activity:    Days per week: Not on file    Minutes per session: Not on file  . Stress: Not on file  Relationships  . Social connections:    Talks on phone: Not on file    Gets together: Not on file    Attends religious service: Not on file    Active member of club or organization: Not on file    Attends meetings of clubs or organizations: Not on file    Relationship status: Not on file  . Intimate partner violence:    Fear of current or ex partner: Not on file    Emotionally abused: Not on file    Physically abused: Not on file    Forced sexual activity: Not on file  Other Topics Concern  . Not on file  Social History Narrative  . Not on file     Current Outpatient Medications on File Prior to Visit  Medication Sig Dispense Refill  .  amLODipine (NORVASC) 10 MG tablet Take 1 tablet (10 mg total) by mouth daily. 90 tablet 3  . atorvastatin (LIPITOR) 20 MG tablet Take 1 tablet (20 mg total) by mouth daily. (Patient taking differently: Take 20 mg by mouth daily before supper. ) 90 tablet 3  . chlorthalidone (HYGROTON) 25 MG tablet Take 1 tablet (25 mg total) by mouth daily. 90 tablet 0  . Cyanocobalamin (VITAMIN B-12 PO) Take 1 tablet by mouth daily.    . isosorbide-hydrALAZINE (BIDIL) 20-37.5 MG tablet Take 2 tablets by mouth 2 (two) times daily. 120 tablet 1   No current facility-administered medications on file prior to visit.     Cardiovascular studies:  EKG 04/21/2018: Atrial fibrillation with controlled ventricular response. Anterolateral ST-elevation -repolarization variant.   Echocardiogram  02/12/2018: - Left ventricle: The cavity size was normal. There was moderate   concentric hypertrophy. Systolic function was mildly reduced. The   estimated ejection fraction was in the range of 45% to 50%. Wall   motion was normal; there were no regional wall motion   abnormalities. Unable to evaluate diastolic function due to   atrial fibrillation. - Mitral valve: Moderately dilated annulus. There was mild   regurgitation. - Left atrium: The atrium was severely dilated. - Right ventricle: Systolic function was low normal. - Right atrium: The atrium was mildly dilated. - No evidence of pulmonary hypertension.  Cath 02/12/2018: LM: Distal 20% disease LAD: Ostial 20% disease Ramus: No significant disease LCx: Dominant. No significant disease RCA: Nondominant. No significant disease  LVEDP normal  Conclusion: Mild nonobstructive coronary artery disease Type 2 MI in the setting of hypertensive urgency.   Review of Systems  Constitution: Negative for decreased appetite, malaise/fatigue, weight gain and weight loss.  HENT: Negative for congestion.   Eyes: Negative for visual disturbance.  Cardiovascular: Negative for chest pain, dyspnea on exertion, leg swelling, palpitations and syncope.  Respiratory: Negative for shortness of breath.   Endocrine: Negative for cold intolerance.  Hematologic/Lymphatic: Does not bruise/bleed easily.  Skin: Negative for itching and rash.  Musculoskeletal: Negative for myalgias.  Gastrointestinal: Negative for abdominal pain, nausea and vomiting.  Genitourinary: Negative for dysuria.  Neurological: Negative for dizziness and weakness.  Psychiatric/Behavioral: The patient is not nervous/anxious.   All other systems reviewed and are negative.      Objective:    Vitals:   04/21/18 1401  BP: (!) 149/91  Pulse: 62  SpO2: 97%     Physical Exam  Constitutional: He is oriented to person, place, and time. He appears well-developed and  well-nourished. No distress.  HENT:  Head: Normocephalic and atraumatic.  Eyes: Pupils are equal, round, and reactive to light. Conjunctivae are normal.  Neck: No JVD present.  Cardiovascular: Normal rate, regular rhythm and intact distal pulses.  Pulmonary/Chest: Effort normal and breath sounds normal. He has no wheezes. He has no rales.  Abdominal: Soft. Bowel sounds are normal. There is no rebound.  Musculoskeletal:        General: No edema.  Lymphadenopathy:    He has no cervical adenopathy.  Neurological: He is alert and oriented to person, place, and time. No cranial nerve deficit.  Skin: Skin is warm and dry.  Psychiatric: He has a normal mood and affect.  Nursing note and vitals reviewed.       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  Persistent atrial fibrillation:   Controlled ventricular response.  CHA2DS2VASc score: 1. Annual stroke risk: 0.6%, thus not on anticoagulation. His risk factors include obesity and suspected obstructive sleep apnea.  Encouraged regular exercise for weight loss.  Referred him for sleep study evaluation.  Given his persistent risk factors and severe left atrial dilatation, I do not think he will do well with rhythm control therapy at this time.  May consider this in future after addressing underlying risk factors.  Cardiomyopathy: Mildly reduced EF.  Suspect hypertensive cardiomyopathy.  Hypertension: Remains suboptimal.  Continue amlodipine 10 mg daily, BiDil 2 tablets of 20-37.5 mg twice daily, Chlorthalidone 25 mg daily.  Increase benazepril to 20 mg daily.  Cautioned him to avoid regular concurrent use of BiDil and sildenafil.  I will see him back in 3 months.   Elder Negus, MD [facility] [place] Pager:  [phone] Office: [phone] If no answer Cell [phone]

## 2018-04-21 NOTE — Patient Instructions (Signed)
Calorie Counting for Weight Loss Calories are units of energy. Your body needs a certain amount of calories from food to keep you going throughout the day. When you eat more calories than your body needs, your body stores the extra calories as fat. When you eat fewer calories than your body needs, your body burns fat to get the energy it needs. Calorie counting means keeping track of how many calories you eat and drink each day. Calorie counting can be helpful if you need to lose weight. If you make sure to eat fewer calories than your body needs, you should lose weight. Ask your health care provider what a healthy weight is for you. For calorie counting to work, you will need to eat the right number of calories in a day in order to lose a healthy amount of weight per week. A dietitian can help you determine how many calories you need in a day and will give you suggestions on how to reach your calorie goal.  A healthy amount of weight to lose per week is usually 1-2 lb (0.5-0.9 kg). This usually means that your daily calorie intake should be reduced by 500-750 calories.  Eating 1,200 - 1,500 calories per day can help most women lose weight.  Eating 1,500 - 1,800 calories per day can help most men lose weight. What is my plan? My goal is to have 1800 calories per day.  Atrial Fibrillation  Atrial fibrillation is a type of heartbeat that is irregular or fast (rapid). If you have this condition, your heart beats without any order. This makes it hard for your heart to pump blood in a normal way. Having this condition gives you more risk for stroke, heart failure, and other heart problems. Atrial fibrillation may start all of a sudden and then stop on its own, or it may become a long-lasting problem. What are the causes? This condition may be caused by heart conditions, such as:  High blood pressure.  Heart failure.  Heart valve disease.  Heart surgery. Other causes  include:  Pneumonia.  Obstructive sleep apnea.  Lung cancer.  Thyroid disease.  Drinking too much alcohol. Sometimes the cause is not known. What increases the risk? You are more likely to develop this condition if:  You smoke.  You are older.  You have diabetes.  You are overweight.  You have a family history of this condition.  You exercise often and hard. What are the signs or symptoms? Common symptoms of this condition include:  A feeling like your heart is beating very fast.  Chest pain.  Feeling short of breath.  Feeling light-headed or weak.  Getting tired easily. Follow these instructions at home: Medicines  Take over-the-counter and prescription medicines only as told by your doctor.  If your doctor gives you a blood-thinning medicine, take it exactly as told. Taking too much of it can cause bleeding. Taking too little of it does not protect you against clots. Clots can cause a stroke. Lifestyle      Do not use any tobacco products. These include cigarettes, chewing tobacco, and e-cigarettes. If you need help quitting, ask your doctor.  Do not drink alcohol.  Do not drink beverages that have caffeine. These include coffee, soda, and tea.  Follow diet instructions as told by your doctor.  Exercise regularly as told by your doctor. General instructions  If you have a condition that causes breathing to stop for a short period of time (apnea), treat it as  told by your doctor.  Keep a healthy weight. Do not use diet pills unless your doctor says they are safe for you. Diet pills may make heart problems worse.  Keep all follow-up visits as told by your doctor. This is important. Contact a doctor if:  You notice a change in the speed, rhythm, or strength of your heartbeat.  You are taking a blood-thinning medicine and you see more bruising.  You get tired more easily when you move or exercise.  You have a sudden change in weight. Get help  right away if:   You have pain in your chest or your belly (abdomen).  You have trouble breathing.  You have blood in your vomit, poop, or pee (urine).  You have any signs of a stroke. "BE FAST" is an easy way to remember the main warning signs: ? B - Balance. Signs are dizziness, sudden trouble walking, or loss of balance. ? E - Eyes. Signs are trouble seeing or a change in how you see. ? F - Face. Signs are sudden weakness or loss of feeling in the face, or the face or eyelid drooping on one side. ? A - Arms. Signs are weakness or loss of feeling in an arm. This happens suddenly and usually on one side of the body. ? S - Speech. Signs are sudden trouble speaking, slurred speech, or trouble understanding what people say. ? T - Time. Time to call emergency services. Write down what time symptoms started.  You have other signs of a stroke, such as: ? A sudden, very bad headache with no known cause. ? Feeling sick to your stomach (nausea). ? Throwing up (vomiting). ? Jerky movements you cannot control (seizure). These symptoms may be an emergency. Do not wait to see if the symptoms will go away. Get medical help right away. Call your local emergency services (911 in the [place] Do not drive yourself to the hospital. Summary  Atrial fibrillation is a type of heartbeat that is irregular or fast (rapid).  You are at higher risk of this condition if you smoke, are older, have diabetes, or are overweight.  Follow your doctor's instructions about medicines, diet, exercise, and follow-up visits.  Get help right away if you think that you have signs of a stroke. This information is not intended to replace advice given to you by your health care provider. Make sure you discuss any questions you have with your health care provider. Document Released: 11/20/2007 Document Revised: 04/03/2017 Document Reviewed: 04/03/2017 Elsevier Interactive Patient Education  2019 [employer].  What do I need  to know about calorie counting? In order to meet your daily calorie goal, you will need to:  Find out how many calories are in each food you would like to eat. Try to do this before you eat.  Decide how much of the food you plan to eat.  Write down what you ate and how many calories it had. Doing this is called keeping a food log. To successfully lose weight, it is important to balance calorie counting with a healthy lifestyle that includes regular activity. Aim for 150 minutes of moderate exercise (such as walking) or 75 minutes of vigorous exercise (such as running) each week. Where do I find calorie information?  The number of calories in a food can be found on a Nutrition Facts label. If a food does not have a Nutrition Facts label, try to look up the calories online or ask your dietitian for help.  Remember that calories are listed per serving. If you choose to have more than one serving of a food, you will have to multiply the calories per serving by the amount of servings you plan to eat. For example, the label on a package of bread might say that a serving size is 1 slice and that there are 90 calories in a serving. If you eat 1 slice, you will have eaten 90 calories. If you eat 2 slices, you will have eaten 180 calories. How do I keep a food log? Immediately after each meal, record the following information in your food log:  What you ate. Don't forget to include toppings, sauces, and other extras on the food.  How much you ate. This can be measured in cups, ounces, or number of items.  How many calories each food and drink had.  The total number of calories in the meal. Keep your food log near you, such as in a small notebook in your pocket, or use a mobile app or website. Some programs will calculate calories for you and show you how many calories you have left for the day to meet your goal. What are some calorie counting tips?   Use your calories on foods and drinks that will  fill you up and not leave you hungry: ? Some examples of foods that fill you up are nuts and nut butters, vegetables, lean proteins, and high-fiber foods like whole grains. High-fiber foods are foods with more than 5 g fiber per serving. ? Drinks such as sodas, specialty coffee drinks, alcohol, and juices have a lot of calories, yet do not fill you up.  Eat nutritious foods and avoid empty calories. Empty calories are calories you get from foods or beverages that do not have many vitamins or protein, such as candy, sweets, and soda. It is better to have a nutritious high-calorie food (such as an avocado) than a food with few nutrients (such as a bag of chips).  Know how many calories are in the foods you eat most often. This will help you calculate calorie counts faster.  Pay attention to calories in drinks. Low-calorie drinks include water and unsweetened drinks.  Pay attention to nutrition labels for "low fat" or "fat free" foods. These foods sometimes have the same amount of calories or more calories than the full fat versions. They also often have added sugar, starch, or salt, to make up for flavor that was removed with the fat.  Find a way of tracking calories that works for you. Get creative. Try different apps or programs if writing down calories does not work for you. What are some portion control tips?  Know how many calories are in a serving. This will help you know how many servings of a certain food you can have.  Use a measuring cup to measure serving sizes. You could also try weighing out portions on a kitchen scale. With time, you will be able to estimate serving sizes for some foods.  Take some time to put servings of different foods on your favorite plates, bowls, and cups so you know what a serving looks like.  Try not to eat straight from a bag or box. Doing this can lead to overeating. Put the amount you would like to eat in a cup or on a plate to make sure you are eating the  right portion.  Use smaller plates, glasses, and bowls to prevent overeating.  Try not to multitask (for example, watch  TV or use your computer) while eating. If it is time to eat, sit down at a table and enjoy your food. This will help you to know when you are full. It will also help you to be aware of what you are eating and how much you are eating. What are tips for following this plan? Reading food labels  Check the calorie count compared to the serving size. The serving size may be smaller than what you are used to eating.  Check the source of the calories. Make sure the food you are eating is high in vitamins and protein and low in saturated and trans fats. Shopping  Read nutrition labels while you shop. This will help you make healthy decisions before you decide to purchase your food.  Make a grocery list and stick to it. Cooking  Try to cook your favorite foods in a healthier way. For example, try baking instead of frying.  Use low-fat dairy products. Meal planning  Use more fruits and vegetables. Half of your plate should be fruits and vegetables.  Include lean proteins like poultry and fish. How do I count calories when eating out?  Ask for smaller portion sizes.  Consider sharing an entree and sides instead of getting your own entree.  If you get your own entree, eat only half. Ask for a box at the beginning of your meal and put the rest of your entree in it so you are not tempted to eat it.  If calories are listed on the menu, choose the lower calorie options.  Choose dishes that include vegetables, fruits, whole grains, low-fat dairy products, and lean protein.  Choose items that are boiled, broiled, grilled, or steamed. Stay away from items that are buttered, battered, fried, or served with cream sauce. Items labeled "crispy" are usually fried, unless stated otherwise.  Choose water, low-fat milk, unsweetened iced tea, or other drinks without added sugar. If you  want an alcoholic beverage, choose a lower calorie option such as a glass of wine or light beer.  Ask for dressings, sauces, and syrups on the side. These are usually high in calories, so you should limit the amount you eat.  If you want a salad, choose a garden salad and ask for grilled meats. Avoid extra toppings like bacon, cheese, or fried items. Ask for the dressing on the side, or ask for olive oil and vinegar or lemon to use as dressing.  Estimate how many servings of a food you are given. For example, a serving of cooked rice is  cup or about the size of half a baseball. Knowing serving sizes will help you be aware of how much food you are eating at restaurants. The list below tells you how big or small some common portion sizes are based on everyday objects: ? 1 oz-4 stacked dice. ? 3 oz-1 deck of cards. ? 1 tsp-1 die. ? 1 Tbsp- a ping-pong ball. ? 2 Tbsp-1 ping-pong ball. ?  cup- baseball. ? 1 cup-1 baseball. Summary  Calorie counting means keeping track of how many calories you eat and drink each day. If you eat fewer calories than your body needs, you should lose weight.  A healthy amount of weight to lose per week is usually 1-2 lb (0.5-0.9 kg). This usually means reducing your daily calorie intake by 500-750 calories.  The number of calories in a food can be found on a Nutrition Facts label. If a food does not have a Nutrition Facts label,  try to look up the calories online or ask your dietitian for help.  Use your calories on foods and drinks that will fill you up, and not on foods and drinks that will leave you hungry.  Use smaller plates, glasses, and bowls to prevent overeating. This information is not intended to replace advice given to you by your health care provider. Make sure you discuss any questions you have with your health care provider. Document Released: 02/10/2005 Document Revised: 10/30/2017 Document Reviewed: 01/11/2016 Elsevier Interactive Patient  Education  2019 [employer].

## 2018-05-12 ENCOUNTER — Telehealth: Payer: Self-pay | Admitting: Family Medicine

## 2018-05-12 NOTE — Telephone Encounter (Signed)
Copied from CRM [id number]. Topic: Quick Communication - Rx Refill/Question >> May 12, 2018  3:14 PM Angela Nevin wrote: Medication: amLODipine (NORVASC) 10 MG tablet, atorvastatin (LIPITOR) 20 MG tablet and chlorthalidone (HYGROTON) 25 MG tablet  Patient is requesting refills. Advised the first two were not originally prescribed by Dr. Salomon Fick but pt states "She is my doctor" and that he is out of medications.   Preferred Pharmacy (with phone number or street name):[facility] [address] ([address]), [place] - [address] [phone] (Phone) [phone] (Fax)

## 2018-05-13 NOTE — Telephone Encounter (Signed)
Ok to refill meds

## 2018-05-14 ENCOUNTER — Other Ambulatory Visit: Payer: Self-pay

## 2018-05-14 DIAGNOSIS — I10 Essential (primary) hypertension: Secondary | ICD-10-CM

## 2018-05-14 DIAGNOSIS — E7800 Pure hypercholesterolemia, unspecified: Secondary | ICD-10-CM

## 2018-05-14 MED ORDER — AMLODIPINE BESYLATE 10 MG PO TABS: 10.0000 mg | ORAL_TABLET | Freq: Every day | ORAL | 1 refills | Status: DC

## 2018-05-14 MED ORDER — ATORVASTATIN CALCIUM 20 MG PO TABS: 20.0000 mg | ORAL_TABLET | Freq: Every day | ORAL | 1 refills | Status: DC

## 2018-05-14 NOTE — Telephone Encounter (Signed)
Meds refilled.

## 2018-05-28 ENCOUNTER — Other Ambulatory Visit: Payer: Self-pay

## 2018-05-28 DIAGNOSIS — I10 Essential (primary) hypertension: Secondary | ICD-10-CM

## 2018-05-28 MED ORDER — ISOSORB DINITRATE-HYDRALAZINE 20-37.5 MG PO TABS: 2.0000 | ORAL_TABLET | Freq: Two times a day (BID) | ORAL | 1 refills | Status: DC

## 2018-07-23 ENCOUNTER — Ambulatory Visit: Payer: 59 | Admitting: Cardiology

## 2018-07-23 NOTE — Progress Notes (Deleted)
Patient is here for follow up visit.  Subjective:   James [name], male    DOB: Aug 30, 1960, 58 y.o.   MRN: [id number]   No chief complaint on file.   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 He was found to have new diagnosis of atrial fibrillation with slow ventricular response and troponin elevation.  cath showed minimal nonobstructive CAD. Troponin elevation was thought to be type 2 MI in the setting of hypertensive urgency. Anticoagulation was not recommende given low CHA2DS2VAsc score of 1.   Patient is here for hospital follow up visit and to establish cardiac care.  Patient denies any further episodes of chest pain, shortness of breath, dizziness.  He is trying to be more compliant with his diet and medications.  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  He endorses snoring at night, but has not had a sleep study.  He questions if he could use his mother's CPAP machine.  Blood pressure improved, but remains suboptimal.   Past Medical History:  Diagnosis Date  . A-fib (HCC)   . Hypercholesteremia   . Hypertension      Past Surgical History:  Procedure Laterality Date  . LEFT HEART CATH AND CORONARY ANGIOGRAPHY N/A 02/12/2018   Procedure: LEFT HEART CATH AND CORONARY ANGIOGRAPHY;  Surgeon: Elder Negus, MD;  Location: [facility] IN[facility];  Service: Cardiovascular;  Laterality: N/A;     Social History   Socioeconomic History  . Marital status: Legally Separated    Spouse name: Not on file  . Number of children: 0  . Years of education: Not on file  . Highest education level: Not on file  Occupational History  . Not on file  Social Needs  . Financial resource strain: Not on file  . Food insecurity:    Worry: Not on file    Inability: Not on file  . Transportation needs:     Medical: Not on file    Non-medical: Not on file  Tobacco Use  . Smoking status: Never Smoker  . Smokeless tobacco: Never Used  Substance and Sexual Activity  . Alcohol use: Yes    Alcohol/week: 1.0 standard drinks    Types: 1 Standard drinks or equivalent per week  . Drug use: No  . Sexual activity: Yes  Lifestyle  . Physical activity:    Days per week: Not on file    Minutes per session: Not on file  . Stress: Not on file  Relationships  . Social connections:    Talks on phone: Not on file    Gets together: Not on file    Attends religious service: Not on file    Active member of club or organization: Not on file    Attends meetings of clubs or organizations: Not on file    Relationship status: Not on file  . Intimate partner violence:    Fear of current or ex partner: Not on file    Emotionally abused: Not on file    Physically abused: Not on file    Forced sexual activity: Not on file  Other Topics Concern  . Not on file  Social History Narrative  . Not on file     Current Outpatient Medications on File Prior to Visit  Medication Sig Dispense Refill  . amLODipine (NORVASC) 10 MG tablet  Take 1 tablet (10 mg total) by mouth daily. 90 tablet 1  . atorvastatin (LIPITOR) 20 MG tablet Take 1 tablet (20 mg total) by mouth daily. 90 tablet 1  . benazepril (LOTENSIN) 20 MG tablet Take 0.5 tablets (10 mg total) by mouth daily. 90 tablet 3  . chlorthalidone (HYGROTON) 25 MG tablet Take 1 tablet (25 mg total) by mouth daily. 90 tablet 0  . Cyanocobalamin (VITAMIN B-12 PO) Take 1 tablet by mouth daily.    . isosorbide-hydrALAZINE (BIDIL) 20-37.5 MG tablet Take 2 tablets by mouth 2 (two) times daily. 120 tablet 1   No current facility-administered medications on file prior to visit.     Cardiovascular studies:  EKG 04/21/2018: Atrial fibrillation with controlled ventricular response. Anterolateral ST-elevation -repolarization variant.   Echocardiogram 02/12/2018: -  Left ventricle: The cavity size was normal. There was moderate   concentric hypertrophy. Systolic function was mildly reduced. The   estimated ejection fraction was in the range of 45% to 50%. Wall   motion was normal; there were no regional wall motion   abnormalities. Unable to evaluate diastolic function due to   atrial fibrillation. - Mitral valve: Moderately dilated annulus. There was mild   regurgitation. - Left atrium: The atrium was severely dilated. - Right ventricle: Systolic function was low normal. - Right atrium: The atrium was mildly dilated. - No evidence of pulmonary hypertension.  Cath 02/12/2018: LM: Distal 20% disease LAD: Ostial 20% disease Ramus: No significant disease LCx: Dominant. No significant disease RCA: Nondominant. No significant disease  LVEDP normal  Conclusion: Mild nonobstructive coronary artery disease Type 2 MI in the setting of hypertensive urgency.   Review of Systems  Constitution: Negative for decreased appetite, malaise/fatigue, weight gain and weight loss.  HENT: Negative for congestion.   Eyes: Negative for visual disturbance.  Cardiovascular: Negative for chest pain, dyspnea on exertion, leg swelling, palpitations and syncope.  Respiratory: Negative for shortness of breath.   Endocrine: Negative for cold intolerance.  Hematologic/Lymphatic: Does not bruise/bleed easily.  Skin: Negative for itching and rash.  Musculoskeletal: Negative for myalgias.  Gastrointestinal: Negative for abdominal pain, nausea and vomiting.  Genitourinary: Negative for dysuria.  Neurological: Negative for dizziness and weakness.  Psychiatric/Behavioral: The patient is not nervous/anxious.   All other systems reviewed and are negative.      Objective:    There were no vitals filed for this visit.   Physical Exam  Constitutional: He is oriented to person, place, and time. He appears well-developed and well-nourished. No distress.  HENT:  Head:  Normocephalic and atraumatic.  Eyes: Pupils are equal, round, and reactive to light. Conjunctivae are normal.  Neck: No JVD present.  Cardiovascular: Normal rate, regular rhythm and intact distal pulses.  Pulmonary/Chest: Effort normal and breath sounds normal. He has no wheezes. He has no rales.  Abdominal: Soft. Bowel sounds are normal. There is no rebound.  Musculoskeletal:        General: No edema.  Lymphadenopathy:    He has no cervical adenopathy.  Neurological: He is alert and oriented to person, place, and time. No cranial nerve deficit.  Skin: Skin is warm and dry.  Psychiatric: He has a normal mood and affect.  Nursing note and vitals reviewed.       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  Persistent atrial fibrillation:  Controlled ventricular response.  CHA2DS2VASc score: 1. Annual stroke risk: 0.6%, thus not on anticoagulation. His risk factors include obesity and suspected obstructive sleep apnea.  Encouraged regular exercise for weight loss.  Referred him for sleep study evaluation.  Given his persistent risk factors and severe left atrial dilatation, I do not think he will do well with rhythm control therapy at this time.  May consider this in future after addressing underlying risk factors.  Cardiomyopathy: Mildly reduced EF.  Suspect hypertensive cardiomyopathy.  Hypertension: Remains suboptimal.  Continue amlodipine 10 mg daily, BiDil 2 tablets of 20-37.5 mg twice daily, Chlorthalidone 25 mg daily.  Increase benazepril to 20 mg daily.  Cautioned him to avoid regular concurrent use of BiDil and sildenafil.  I will see him back in 3 months.   Elder Negus, MD [facility] Pager: [phone] Office: [phone] If no answer Cell  [phone]

## 2018-08-31 ENCOUNTER — Other Ambulatory Visit: Payer: Self-pay | Admitting: *Deleted

## 2018-08-31 DIAGNOSIS — Z20822 Contact with and (suspected) exposure to covid-19: Secondary | ICD-10-CM

## 2018-09-02 ENCOUNTER — Encounter: Payer: Self-pay | Admitting: Cardiology

## 2018-09-02 ENCOUNTER — Telehealth: Payer: Self-pay | Admitting: *Deleted

## 2018-09-02 NOTE — Telephone Encounter (Signed)
Patient has been tested for COVID due to work exposure- he is concerned about going back to work not knowing his results. Patient has a heart condition and wants to isolate until his results are back. He would like a letter for that. Patient informed that we have a generic letter with CDC recommendations- if he needs something more specific- he may need to contact his cardiologist.

## 2018-09-03 NOTE — Telephone Encounter (Signed)
Per pt. Request, mailed letter to residence giving date of COVID testing, and guidelines to follow, if returns with positive results.

## 2018-09-04 LAB — NOVEL CORONAVIRUS, NAA: SARS-CoV-2, NAA: NOT DETECTED

## 2018-09-13 ENCOUNTER — Ambulatory Visit: Payer: Self-pay | Admitting: Cardiology

## 2018-09-15 ENCOUNTER — Other Ambulatory Visit: Payer: Self-pay | Admitting: Cardiology

## 2018-09-15 DIAGNOSIS — I10 Essential (primary) hypertension: Secondary | ICD-10-CM

## 2018-09-15 NOTE — Telephone Encounter (Signed)
Fill? 

## 2018-09-17 ENCOUNTER — Other Ambulatory Visit: Payer: Self-pay | Admitting: Family Medicine

## 2018-09-17 DIAGNOSIS — I10 Essential (primary) hypertension: Secondary | ICD-10-CM

## 2018-10-24 ENCOUNTER — Ambulatory Visit (HOSPITAL_COMMUNITY): Payer: Self-pay

## 2018-10-24 ENCOUNTER — Other Ambulatory Visit: Payer: Self-pay

## 2018-10-24 ENCOUNTER — Encounter (HOSPITAL_COMMUNITY): Payer: Self-pay

## 2018-10-24 ENCOUNTER — Ambulatory Visit (HOSPITAL_COMMUNITY)
Admission: EM | Admit: 2018-10-24 | Discharge: 2018-10-24 | Disposition: A | Discharge status: Home / Self Care | Payer: Self-pay | Attending: Family Medicine | Admitting: Family Medicine

## 2018-10-24 ENCOUNTER — Ambulatory Visit (INDEPENDENT_AMBULATORY_CARE_PROVIDER_SITE_OTHER): Payer: Self-pay

## 2018-10-24 DIAGNOSIS — M25562 Pain in left knee: Secondary | ICD-10-CM

## 2018-10-24 DIAGNOSIS — M25462 Effusion, left knee: Secondary | ICD-10-CM

## 2018-10-24 DIAGNOSIS — I10 Essential (primary) hypertension: Secondary | ICD-10-CM

## 2018-10-24 MED ORDER — HYDROCODONE-ACETAMINOPHEN 7.5-325 MG PO TABS: 1.0000 | ORAL_TABLET | Freq: Four times a day (QID) | ORAL | 0 refills | Status: DC | PRN

## 2018-10-24 MED ORDER — MELOXICAM 15 MG PO TABS: 15.0000 mg | ORAL_TABLET | Freq: Every day | ORAL | 0 refills | Status: DC

## 2018-10-24 MED ORDER — METHYLPREDNISOLONE ACETATE 40 MG/ML IJ SUSP
INTRAMUSCULAR | Status: AC
  Filled 2018-10-24: qty 1

## 2018-10-24 NOTE — ED Provider Notes (Signed)
[facility]    CSN: [id number] Arrival date & time: 10/24/18  1443      History   Chief Complaint Chief Complaint  Patient presents with  . Knee Pain    HPI James [name] is a 58 y.o. male.   HPI Patient works as a Administrator.  He has pain in his left knee.  Is been going on for many months to years.  It flares up and then gets better again.  He takes some over-the-counter medication.  Has tried ice.  Has tried braces.  He does not know what flared it up.  He does not know what makes it better.  When it does swell up it gets very large.  He shows me a picture of the knee with a large effusion.  No clicking or popping.  No buckling or locking.  No instability.  No other joint involvement. Past Medical History:  Diagnosis Date  . A-fib ([place])   . Hypercholesteremia   . Hypertension     Patient Active Problem List   Diagnosis Date Noted  . Hypertensive emergency 02/12/2018  . Acute pulmonary edema ([place]) 02/12/2018  . Unspecified atrial fibrillation ([place]) 02/12/2018  . Elevated troponin 02/12/2018  . Hypertensive urgency 02/12/2018  . Acute respiratory failure with hypoxia ([place]) 02/11/2018  . HTN (hypertension) 10/04/2014    Past Surgical History:  Procedure Laterality Date  . LEFT HEART CATH AND CORONARY ANGIOGRAPHY N/A 02/12/2018   Procedure: LEFT HEART CATH AND CORONARY ANGIOGRAPHY;  Surgeon: Nigel Mormon, MD;  Location: [facility];  Service: Cardiovascular;  Laterality: N/A;       Home Medications    Prior to Admission medications   Medication Sig Start Date End Date Taking? Authorizing Provider  amLODipine (NORVASC) 10 MG tablet Take 1 tablet (10 mg total) by mouth daily. 05/14/18   Billie Ruddy, MD  atorvastatin (LIPITOR) 20 MG tablet Take 1 tablet (20 mg total) by mouth daily. 05/14/18   Billie Ruddy, MD  benazepril (LOTENSIN) 20 MG tablet Take 0.5 tablets (10 mg total) by mouth daily. 04/21/18   Patwardhan, Reynold Bowen,  MD  BIDIL 20-37.5 MG tablet Take 2 tablets by mouth twice daily 09/15/18   Patwardhan, Reynold Bowen, MD  chlorthalidone (HYGROTON) 25 MG tablet Take 1 tablet by mouth once daily 09/17/18   Billie Ruddy, MD  Cyanocobalamin (VITAMIN B-12 PO) Take 1 tablet by mouth daily.    [provider]  HYDROcodone-acetaminophen (NORCO) 7.5-325 MG tablet Take 1 tablet by mouth every 6 (six) hours as needed for moderate pain. 10/24/18   Raylene Everts, MD  meloxicam (MOBIC) 15 MG tablet Take 1 tablet (15 mg total) by mouth daily. 10/24/18   Raylene Everts, MD    Family History Family History  Problem Relation Age of Onset  . Diabetes Mother   . CAD Brother     Social History Social History   Tobacco Use  . Smoking status: Never Smoker  . Smokeless tobacco: Never Used  Substance Use Topics  . Alcohol use: Yes    Alcohol/week: 1.0 standard drinks    Types: 1 Standard drinks or equivalent per week  . Drug use: No     Allergies   Patient has no known allergies.   Review of Systems Review of Systems  Constitutional: Negative for chills and fever.  HENT: Negative for ear pain and sore throat.   Eyes: Negative for pain and visual disturbance.  Respiratory:  Negative for cough and shortness of breath.   Cardiovascular: Negative for chest pain and palpitations.  Gastrointestinal: Negative for abdominal pain and vomiting.  Genitourinary: Negative for dysuria and hematuria.  Musculoskeletal: Positive for arthralgias and gait problem. Negative for back pain.  Skin: Negative for color change and rash.  Neurological: Negative for seizures and syncope.  All other systems reviewed and are negative.    Physical Exam Triage Vital Signs ED Triage Vitals [10/24/18 1505]  Enc Vitals Group     BP (!) 173/89     Pulse Rate 63     Resp 17     Temp 98.2 F (36.8 C)     Temp Source Oral     SpO2 100 %     Weight      Height      Head Circumference      Peak Flow      Pain Score 9      Pain Loc      Pain Edu?      Excl. in GC?    No data found.  Updated Vital Signs BP (!) 173/89 (BP Location: Right Arm)   Pulse 63   Temp 98.2 F (36.8 C) (Oral)   Resp 17   SpO2 100%        Physical Exam Constitutional:      General: He is not in acute distress.    Appearance: He is well-developed. He is obese.     Comments: Obese.  Antalgic gait  HENT:     Head: Normocephalic and atraumatic.  Eyes:     Conjunctiva/sclera: Conjunctivae normal.     Pupils: Pupils are equal, round, and reactive to light.  Neck:     Musculoskeletal: Normal range of motion.  Cardiovascular:     Rate and Rhythm: Normal rate.  Pulmonary:     Effort: Pulmonary effort is normal. No respiratory distress.  Abdominal:     General: There is no distension.     Palpations: Abdomen is soft.  Musculoskeletal: Normal range of motion.     Comments: Left knee has moderate effusion.  There is significant tenderness to palpation of the patella and with any movement of the patella.  Mild tenderness of the medial.  In the lateral joint line.  Can flex to 90 degrees and extend fully.  No pain with ligamentous stress or drawer testing.  Skin:    General: Skin is warm and dry.  Neurological:     Mental Status: He is alert.      UC Treatments / Results  Labs (all labs ordered are listed, but only abnormal results are displayed) Labs Reviewed - No data to display  EKG   Radiology Dg Knee Ap/lat W/sunrise Left  Result Date: 10/24/2018 CLINICAL DATA:  Chronic left knee pain. EXAM: LEFT KNEE 3 VIEWS COMPARISON:  None. FINDINGS: No acute fracture or dislocation. No joint effusion. Mild medial compartment joint space narrowing. Tiny tricompartmental osteophytes. Bone mineralization is normal. Soft tissues are unremarkable. IMPRESSION: 1. Mild medial compartment osteoarthritis. 2.  No acute osseous abnormality. Electronically Signed   By: Obie Dredge M.D.   On: 10/24/2018 16:32    Procedures Join  Aspiration/Injection  Date/Time: 10/24/2018 6:45 PM Performed by: Eustace Moore, MD Authorized by: Eustace Moore, MD   Consent:    Consent obtained:  Verbal   Consent given by:  Patient   Risks discussed:  Pain   Alternatives discussed:  No treatment Location:    Location:  Knee Anesthesia (see MAR for exact dosages):    Anesthesia method:  Local infiltration   Local anesthetic:  Lidocaine 1% w/o epi Procedure details:    Needle gauge:  22 G   Ultrasound guidance: no     Approach:  Lateral   Steroid injected: yes   Post-procedure details:    Dressing:  Adhesive bandage   Patient tolerance of procedure:  Tolerated well, no immediate complications   (including critical care time)  Medications Ordered in UC Medications  methylPREDNISolone acetate (DEPO-MEDROL) 40 MG/ML injection (has no administration in time range)    Initial Impression / Assessment and Plan / UC Course  I have reviewed the triage vital signs and the nursing notes.  Pertinent labs & imaging results that were available during my care of the patient were reviewed by me and considered in my medical decision making (see chart for details).      Final Clinical Impressions(s) / UC Diagnoses   Final diagnoses:  Acute pain of left knee  Effusion of knee joint, left     Discharge Instructions     Put ice on knee for 20 minutes. Before you go to bed put ice on knee for 20 minutes again Take meloxicam once a day with food.  This is an anti-inflammatory arthritis medicine Take the hydrocodone as needed for severe pain.  Do not drive while taking hydrocodone.  Make sure you take this with food Your knee should start feeling better over the next couple days.  Call if you have problems    ED Prescriptions    Medication Sig Dispense Auth. Provider   HYDROcodone-acetaminophen (NORCO) 7.5-325 MG tablet Take 1 tablet by mouth every 6 (six) hours as needed for moderate pain. 15 tablet Eustace MooreNelson, Kyle Luppino  Sue, MD   meloxicam (MOBIC) 15 MG tablet Take 1 tablet (15 mg total) by mouth daily. 30 tablet Eustace MooreNelson, Syaire Saber Sue, MD     Controlled Substance Prescriptions [place] Controlled Substance Registry consulted? Yes, I have consulted the [place] Controlled Substances Registry for this patient, and feel the risk/benefit ratio today is favorable for proceeding with this prescription for a controlled substance.   Eustace MooreNelson, Tedric Leeth Sue, MD 10/24/18 [phone]

## 2018-10-24 NOTE — ED Triage Notes (Signed)
Pt presents with chronic left knee pain.

## 2018-10-24 NOTE — ED Provider Notes (Signed)
  Aspiration/Injection Procedure Note James [name] [birth year]  Procedure: Injection Indications: left knee pain   Procedure Details Consent: Risks of procedure as well as the alternatives and risks of each were explained to the (patient/caregiver).  Consent for procedure obtained. Time Out: Verified patient identification, verified procedure, site/side was marked, verified correct patient position, special equipment/implants available, medications/allergies/relevent history reviewed, required imaging and test results available.  Performed.  The area was cleaned with iodine and alcohol swabs.    The posterior lateral anterior left knee joint was injected using 1 cc's of 40 mg Depo-Medrol and 4 cc's of 1% lidocaine with a 22 1 1/2" needle.    A sterile dressing was applied.  Patient did tolerate procedure well.  Rosemarie Ax, MD 10/24/2018, 4:35 PM     Rosemarie Ax, MD 10/24/18 [phone]

## 2018-10-24 NOTE — Discharge Instructions (Addendum)
Put ice on knee for 20 minutes. Before you go to bed put ice on knee for 20 minutes again Take meloxicam once a day with food.  This is an anti-inflammatory arthritis medicine Take the hydrocodone as needed for severe pain.  Do not drive while taking hydrocodone.  Make sure you take this with food Your knee should start feeling better over the next couple days.  Call if you have problems

## 2018-11-09 ENCOUNTER — Encounter (HOSPITAL_COMMUNITY): Payer: Self-pay | Admitting: Emergency Medicine

## 2018-11-09 ENCOUNTER — Ambulatory Visit (HOSPITAL_COMMUNITY)
Admission: EM | Admit: 2018-11-09 | Discharge: 2018-11-09 | Disposition: A | Discharge status: Home / Self Care | Payer: HRSA Program | Attending: Family Medicine | Admitting: Family Medicine

## 2018-11-09 ENCOUNTER — Other Ambulatory Visit: Payer: Self-pay | Admitting: Family Medicine

## 2018-11-09 ENCOUNTER — Ambulatory Visit (INDEPENDENT_AMBULATORY_CARE_PROVIDER_SITE_OTHER): Payer: HRSA Program

## 2018-11-09 ENCOUNTER — Other Ambulatory Visit: Payer: Self-pay

## 2018-11-09 DIAGNOSIS — Z20828 Contact with and (suspected) exposure to other viral communicable diseases: Secondary | ICD-10-CM

## 2018-11-09 DIAGNOSIS — R05 Cough: Secondary | ICD-10-CM

## 2018-11-09 DIAGNOSIS — Z79899 Other long term (current) drug therapy: Secondary | ICD-10-CM | POA: Insufficient documentation

## 2018-11-09 DIAGNOSIS — Z833 Family history of diabetes mellitus: Secondary | ICD-10-CM | POA: Insufficient documentation

## 2018-11-09 DIAGNOSIS — I10 Essential (primary) hypertension: Secondary | ICD-10-CM

## 2018-11-09 DIAGNOSIS — Z8249 Family history of ischemic heart disease and other diseases of the circulatory system: Secondary | ICD-10-CM | POA: Insufficient documentation

## 2018-11-09 DIAGNOSIS — Z20822 Contact with and (suspected) exposure to covid-19: Secondary | ICD-10-CM

## 2018-11-09 DIAGNOSIS — I4891 Unspecified atrial fibrillation: Secondary | ICD-10-CM | POA: Insufficient documentation

## 2018-11-09 DIAGNOSIS — R059 Cough, unspecified: Secondary | ICD-10-CM

## 2018-11-09 NOTE — ED Triage Notes (Signed)
Pt sts cough x 1 week that is productive per pt

## 2018-11-09 NOTE — Telephone Encounter (Signed)
Pt is requesting a refill for amLODipine (NORVASC) 10 MG tablet    Pharmacy:  [facility] [address] ([address] [place] [phone] (Phone) [phone] (Fax)

## 2018-11-09 NOTE — ED Provider Notes (Addendum)
[place]    CSN: [id number] Arrival date & time: 11/09/18  1151      History   Chief Complaint Chief Complaint  Patient presents with  . Cough    HPI James [name] is a 58 y.o. male.   Patient presents with cough productive of green-yellow sputum x1 week.  He states he smokes marijuana x several years.  He denies fever, chills, sore throat, shortness of breath, vomiting, diarrhea, or other symptoms.  His blood pressure is elevated today but he states he has not taken his blood pressure medications yet.  He denies headache, weakness, dizziness, or other symptoms.  The history is provided by the patient.    Past Medical History:  Diagnosis Date  . A-fib ([place])   . Hypercholesteremia   . Hypertension     Patient Active Problem List   Diagnosis Date Noted  . Hypertensive emergency 02/12/2018  . Acute pulmonary edema ([place]) 02/12/2018  . Unspecified atrial fibrillation ([place]) 02/12/2018  . Elevated troponin 02/12/2018  . Hypertensive urgency 02/12/2018  . Acute respiratory failure with hypoxia ([place]) 02/11/2018  . HTN (hypertension) 10/04/2014    Past Surgical History:  Procedure Laterality Date  . LEFT HEART CATH AND CORONARY ANGIOGRAPHY N/A 02/12/2018   Procedure: LEFT HEART CATH AND CORONARY ANGIOGRAPHY;  Surgeon: Nigel Mormon, MD;  Location: [facility];  Service: Cardiovascular;  Laterality: N/A;       Home Medications    Prior to Admission medications   Medication Sig Start Date End Date Taking? Authorizing Provider  amLODipine (NORVASC) 10 MG tablet Take 1 tablet (10 mg total) by mouth daily. 05/14/18   Billie Ruddy, MD  atorvastatin (LIPITOR) 20 MG tablet Take 1 tablet (20 mg total) by mouth daily. 05/14/18   Billie Ruddy, MD  benazepril (LOTENSIN) 20 MG tablet Take 0.5 tablets (10 mg total) by mouth daily. 04/21/18   Patwardhan, Reynold Bowen, MD  BIDIL 20-37.5 MG tablet Take 2 tablets by mouth twice daily 09/15/18    Patwardhan, Reynold Bowen, MD  chlorthalidone (HYGROTON) 25 MG tablet Take 1 tablet by mouth once daily 09/17/18   Billie Ruddy, MD  Cyanocobalamin (VITAMIN B-12 PO) Take 1 tablet by mouth daily.    [provider]  HYDROcodone-acetaminophen (NORCO) 7.5-325 MG tablet Take 1 tablet by mouth every 6 (six) hours as needed for moderate pain. 10/24/18   Raylene Everts, MD  meloxicam (MOBIC) 15 MG tablet Take 1 tablet (15 mg total) by mouth daily. 10/24/18   Raylene Everts, MD    Family History Family History  Problem Relation Age of Onset  . Diabetes Mother   . CAD Brother     Social History Social History   Tobacco Use  . Smoking status: Never Smoker  . Smokeless tobacco: Never Used  Substance Use Topics  . Alcohol use: Yes    Alcohol/week: 1.0 standard drinks    Types: 1 Standard drinks or equivalent per week  . Drug use: No     Allergies   Patient has no known allergies.   Review of Systems Review of Systems  Constitutional: Negative for chills and fever.  HENT: Negative for congestion, ear pain, rhinorrhea and sore throat.   Eyes: Negative for pain and visual disturbance.  Respiratory: Positive for cough. Negative for shortness of breath.   Cardiovascular: Negative for chest pain and palpitations.  Gastrointestinal: Negative for abdominal pain and vomiting.  Genitourinary: Negative for dysuria and hematuria.  Musculoskeletal: Negative for arthralgias and back pain.  Skin: Negative for color change and rash.  Neurological: Negative for dizziness, tremors, seizures, syncope, facial asymmetry, speech difficulty, weakness, light-headedness, numbness and headaches.  All other systems reviewed and are negative.    Physical Exam Triage Vital Signs ED Triage Vitals  Enc Vitals Group     BP 11/09/18 1204 (!) 196/110     Pulse Rate 11/09/18 1204 64     Resp 11/09/18 1204 17     Temp 11/09/18 1204 (!) 97.5 F (36.4 C)     Temp Source 11/09/18 1204 Tympanic      SpO2 11/09/18 1204 98 %     Weight --      Height --      Head Circumference --      Peak Flow --      Pain Score 11/09/18 1210 6     Pain Loc --      Pain Edu? --      Excl. in GC? --    No data found.  Updated Vital Signs BP (!) 187/95 (BP Location: Right Arm) Comment: bp recheck  Pulse 64   Temp (!) 97.5 F (36.4 C) (Tympanic)   Resp 17   SpO2 98%   Visual Acuity Right Eye Distance:   Left Eye Distance:   Bilateral Distance:    Right Eye Near:   Left Eye Near:    Bilateral Near:     Physical Exam Vitals signs and nursing note reviewed.  Constitutional:      Appearance: He is well-developed.  HENT:     Head: Normocephalic and atraumatic.     Right Ear: Tympanic membrane normal.     Left Ear: Tympanic membrane normal.     Nose: Nose normal.     Mouth/Throat:     Mouth: Mucous membranes are moist.     Pharynx: Oropharynx is clear.  Eyes:     Conjunctiva/sclera: Conjunctivae normal.  Neck:     Musculoskeletal: Neck supple.  Cardiovascular:     Rate and Rhythm: Normal rate and regular rhythm.     Heart sounds: No murmur.  Pulmonary:     Effort: Pulmonary effort is normal. No respiratory distress.     Breath sounds: Rhonchi present.     Comments: Few scattered rhonchi which clear with cough. Abdominal:     General: Bowel sounds are normal.     Palpations: Abdomen is soft.     Tenderness: There is no abdominal tenderness. There is no right CVA tenderness, left CVA tenderness, guarding or rebound.  Skin:    General: Skin is warm and dry.  Neurological:     General: No focal deficit present.     Mental Status: He is alert and oriented to person, place, and time.      UC Treatments / Results  Labs (all labs ordered are listed, but only abnormal results are displayed) Labs Reviewed  NOVEL CORONAVIRUS, NAA (HOSP ORDER, SEND-OUT TO REF LAB; TAT 18-24 HRS)    EKG   Radiology No results found.  Procedures Procedures (including critical care time)   Medications Ordered in UC Medications - No data to display  Initial Impression / Assessment and Plan / UC Course  I have reviewed the triage vital signs and the nursing notes.  Pertinent labs & imaging results that were available during my care of the patient were reviewed by me and considered in my medical decision making (see chart for details).  Cough, suspected COVID.  Elevated blood pressure with known hypertension.  CXR negative.  COVID test performed here.  Instructed patient to self quarantine until his test results are back.  Instructed patient to go to the emergency department if he develops high fever, shortness of breath, severe diarrhea, or other concerning symptoms.  Discussed with patient that his blood pressure is elevated today.  Instructed him to take his blood pressure medications when he gets home and to follow-up with his PCP in 3 to 4 days for a recheck.  Patient agrees with plan of care.    Final Clinical Impressions(s) / UC Diagnoses   Final diagnoses:  Cough  Suspected Covid-19 Virus Infection  Elevated blood pressure reading in office with diagnosis of hypertension     Discharge Instructions     Your COVID test is pending.  You should self quarantine until your test result is back and is negative.    Go to the emergency department if you develop shortness of breath, high fever, severe diarrhea, or other concerning symptoms.    Your blood pressure is elevated today at 196/110 and 187/95.  Please have this rechecked by your primary care provider in 3-4 days.     ED Prescriptions    None     Controlled Substance Prescriptions [place] Controlled Substance Registry consulted? Not Applicable   Mickie Bailate, Jamoni Broadfoot H, NP 11/09/18 1313    Mickie Bailate, Cova Knieriem H, NP 11/09/18 1316

## 2018-11-09 NOTE — Telephone Encounter (Signed)
Routed incorrectly  °

## 2018-11-09 NOTE — Discharge Instructions (Addendum)
Your chest xray was normal.  Take Mucinex as needed for your cough.    Your COVID test is pending.  You should self quarantine until your test result is back and is negative.    Go to the emergency department if you develop shortness of breath, high fever, severe diarrhea, or other concerning symptoms.    Your blood pressure is elevated today at 196/110 and 187/95.  Please have this rechecked by your primary care provider in 3-4 days.

## 2018-11-12 LAB — NOVEL CORONAVIRUS, NAA (HOSP ORDER, SEND-OUT TO REF LAB; TAT 18-24 HRS): SARS-CoV-2, NAA: NOT DETECTED

## 2018-11-12 MED ORDER — AMLODIPINE BESYLATE 10 MG PO TABS: 10.0000 mg | ORAL_TABLET | Freq: Every day | ORAL | 1 refills | Status: DC

## 2018-11-12 MED FILL — AMLODIPINE BESYLATE 10 MG T: 10 | 90 days supply | Qty: 90 | Fill #0

## 2018-11-17 ENCOUNTER — Ambulatory Visit: Payer: Self-pay | Admitting: Cardiology

## 2018-11-17 NOTE — Progress Notes (Deleted)
Patient is here for follow up visit.  Subjective:   James [name], male    DOB: September 11, 1960, 58 y.o.   MRN: [id number]   No chief complaint on file.   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 He was found to have new diagnosis of atrial fibrillation with slow ventricular response and troponin elevation.  cath showed minimal nonobstructive CAD. Troponin elevation was thought to be type 2 MI in the setting of hypertensive urgency. Anticoagulation was not recommende given low CHA2DS2VAsc score of 1.   Patient is here for hospital follow up visit and to establish cardiac care.  Patient denies any further episodes of chest pain, shortness of breath, dizziness.  He is trying to be more compliant with his diet and medications.  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  He endorses snoring at night, but has not had a sleep study.  He questions if he could use his mother's CPAP machine.  Blood pressure improved, but remains suboptimal.   Past Medical History:  Diagnosis Date  . A-fib ([place])   . Hypercholesteremia   . Hypertension      Past Surgical History:  Procedure Laterality Date  . LEFT HEART CATH AND CORONARY ANGIOGRAPHY N/A 02/12/2018   Procedure: LEFT HEART CATH AND CORONARY ANGIOGRAPHY;  Surgeon: Nigel Mormon, MD;  Location: [facility];  Service: Cardiovascular;  Laterality: N/A;     Social History   Socioeconomic History  . Marital status: Legally Separated    Spouse name: Not on file  . Number of children: 0  . Years of education: Not on file  . Highest education level: Not on file  Occupational History  . Not on file  Social Needs  . Financial resource strain: Not on file  . Food insecurity    Worry: Not on file    Inability: Not on file  . Transportation needs    Medical: Not on file    Non-medical: Not on file  Tobacco Use  . Smoking status: Never Smoker  . Smokeless tobacco: Never Used  Substance and Sexual Activity  . Alcohol use: Yes    Alcohol/week: 1.0 standard drinks    Types: 1 Standard drinks or equivalent per week  . Drug use: No  . Sexual activity: Yes  Lifestyle  . Physical activity    Days per week: Not on file    Minutes per session: Not on file  . Stress: Not on file  Relationships  . Social Herbalist on phone: Not on file    Gets together: Not on file    Attends religious service: Not on file    Active member of club or organization: Not on file    Attends meetings of clubs or organizations: Not on file    Relationship status: Not on file  . Intimate partner violence    Fear of current or ex partner: Not on file    Emotionally abused: Not on file    Physically abused: Not on file    Forced sexual activity: Not on file  Other Topics Concern  . Not on file  Social History Narrative  . Not on file     Current Outpatient Medications on File Prior to Visit  Medication Sig Dispense Refill  . amLODipine (NORVASC) 10 MG tablet Take  1 tablet (10 mg total) by mouth daily. 90 tablet 1  . atorvastatin (LIPITOR) 20 MG tablet Take 1 tablet (20 mg total) by mouth daily. 90 tablet 1  . benazepril (LOTENSIN) 20 MG tablet Take 0.5 tablets (10 mg total) by mouth daily. 90 tablet 3  . BIDIL 20-37.5 MG tablet Take 2 tablets by mouth twice daily 120 tablet 0  . chlorthalidone (HYGROTON) 25 MG tablet Take 1 tablet by mouth once daily 90 tablet 0  . Cyanocobalamin (VITAMIN B-12 PO) Take 1 tablet by mouth daily.    Marland Kitchen HYDROcodone-acetaminophen (NORCO) 7.5-325 MG tablet Take 1 tablet by mouth every 6 (six) hours as needed for moderate pain. 15 tablet 0  . meloxicam (MOBIC) 15 MG tablet Take 1 tablet (15 mg total) by mouth daily. 30 tablet 0   No current facility-administered medications on file prior to visit.      Cardiovascular studies:  EKG 04/21/2018: Atrial fibrillation with controlled ventricular response. Anterolateral ST-elevation -repolarization variant.   Echocardiogram 02/12/2018: - Left ventricle: The cavity size was normal. There was moderate   concentric hypertrophy. Systolic function was mildly reduced. The   estimated ejection fraction was in the range of 45% to 50%. Wall   motion was normal; there were no regional wall motion   abnormalities. Unable to evaluate diastolic function due to   atrial fibrillation. - Mitral valve: Moderately dilated annulus. There was mild   regurgitation. - Left atrium: The atrium was severely dilated. - Right ventricle: Systolic function was low normal. - Right atrium: The atrium was mildly dilated. - No evidence of pulmonary hypertension.  Cath 02/12/2018: LM: Distal 20% disease LAD: Ostial 20% disease Ramus: No significant disease LCx: Dominant. No significant disease RCA: Nondominant. No significant disease  LVEDP normal  Conclusion: Mild nonobstructive coronary artery disease Type 2 MI in the setting of hypertensive urgency.   Review of Systems  Constitution: Negative for decreased appetite, malaise/fatigue, weight gain and weight loss.  HENT: Negative for congestion.   Eyes: Negative for visual disturbance.  Cardiovascular: Negative for chest pain, dyspnea on exertion, leg swelling, palpitations and syncope.  Respiratory: Negative for shortness of breath.   Endocrine: Negative for cold intolerance.  Hematologic/Lymphatic: Does not bruise/bleed easily.  Skin: Negative for itching and rash.  Musculoskeletal: Negative for myalgias.  Gastrointestinal: Negative for abdominal pain, nausea and vomiting.  Genitourinary: Negative for dysuria.  Neurological: Negative for dizziness and weakness.  Psychiatric/Behavioral: The patient is not nervous/anxious.   All other systems reviewed and are negative.      Objective:    There were  no vitals filed for this visit.   Physical Exam  Constitutional: He is oriented to person, place, and time. He appears well-developed and well-nourished. No distress.  HENT:  Head: Normocephalic and atraumatic.  Eyes: Pupils are equal, round, and reactive to light. Conjunctivae are normal.  Neck: No JVD present.  Cardiovascular: Normal rate, regular rhythm and intact distal pulses.  Pulmonary/Chest: Effort normal and breath sounds normal. He has no wheezes. He has no rales.  Abdominal: Soft. Bowel sounds are normal. There is no rebound.  Musculoskeletal:        General: No edema.  Lymphadenopathy:    He has no cervical adenopathy.  Neurological: He is alert and oriented to person, place, and time. No cranial nerve deficit.  Skin: Skin is warm and dry.  Psychiatric: He has a normal mood and affect.  Nursing note and vitals reviewed.       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  Persistent atrial fibrillation:   Controlled ventricular response.  CHA2DS2VASc score: 1. Annual stroke risk: 0.6%, thus not on anticoagulation. His risk factors include obesity and suspected obstructive sleep apnea.  Encouraged regular exercise for weight loss.  Referred him for sleep study evaluation.  Given his persistent risk factors and severe left atrial dilatation, I do not think he will do well with rhythm control therapy at this time.  May consider this in future after addressing underlying risk factors.  Cardiomyopathy: Mildly reduced EF.  Suspect hypertensive cardiomyopathy.  Hypertension: Remains suboptimal.  Continue amlodipine 10 mg daily, BiDil 2 tablets of 20-37.5 mg twice daily, Chlorthalidone 25 mg daily.  Increase benazepril to 20 mg daily.  Cautioned him to avoid regular  concurrent use of BiDil and sildenafil.  I will see him back in 3 months.   Deidra Spease J Aerica Rincon, MD [facility] Pager: [phone] Office: [phone] If no answer Cell [phone]   

## 2018-11-26 ENCOUNTER — Other Ambulatory Visit: Payer: Self-pay

## 2018-11-26 DIAGNOSIS — I10 Essential (primary) hypertension: Secondary | ICD-10-CM

## 2018-11-26 MED ORDER — AMLODIPINE BESYLATE 10 MG PO TABS: 10.0000 mg | ORAL_TABLET | Freq: Every day | ORAL | 1 refills | Status: DC

## 2018-11-26 MED ORDER — BENAZEPRIL HCL 20 MG PO TABS: 10.0000 mg | ORAL_TABLET | Freq: Every day | ORAL | 3 refills | Status: DC

## 2018-12-10 ENCOUNTER — Ambulatory Visit: Payer: Self-pay | Admitting: Cardiology

## 2018-12-10 DIAGNOSIS — R0683 Snoring: Secondary | ICD-10-CM | POA: Insufficient documentation

## 2018-12-10 NOTE — Progress Notes (Deleted)
Patient is here for follow up visit.  Subjective:   James [name], male    DOB: September 11, 1960, 58 y.o.   MRN: [id number]   No chief complaint on file.   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 He was found to have new diagnosis of atrial fibrillation with slow ventricular response and troponin elevation.  cath showed minimal nonobstructive CAD. Troponin elevation was thought to be type 2 MI in the setting of hypertensive urgency. Anticoagulation was not recommende given low CHA2DS2VAsc score of 1.   Patient is here for hospital follow up visit and to establish cardiac care.  Patient denies any further episodes of chest pain, shortness of breath, dizziness.  He is trying to be more compliant with his diet and medications.  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  He endorses snoring at night, but has not had a sleep study.  He questions if he could use his mother's CPAP machine.  Blood pressure improved, but remains suboptimal.   Past Medical History:  Diagnosis Date  . A-fib ([place])   . Hypercholesteremia   . Hypertension      Past Surgical History:  Procedure Laterality Date  . LEFT HEART CATH AND CORONARY ANGIOGRAPHY N/A 02/12/2018   Procedure: LEFT HEART CATH AND CORONARY ANGIOGRAPHY;  Surgeon: Nigel Mormon, MD;  Location: [facility];  Service: Cardiovascular;  Laterality: N/A;     Social History   Socioeconomic History  . Marital status: Legally Separated    Spouse name: Not on file  . Number of children: 0  . Years of education: Not on file  . Highest education level: Not on file  Occupational History  . Not on file  Social Needs  . Financial resource strain: Not on file  . Food insecurity    Worry: Not on file    Inability: Not on file  . Transportation needs    Medical: Not on file    Non-medical: Not on file  Tobacco Use  . Smoking status: Never Smoker  . Smokeless tobacco: Never Used  Substance and Sexual Activity  . Alcohol use: Yes    Alcohol/week: 1.0 standard drinks    Types: 1 Standard drinks or equivalent per week  . Drug use: No  . Sexual activity: Yes  Lifestyle  . Physical activity    Days per week: Not on file    Minutes per session: Not on file  . Stress: Not on file  Relationships  . Social Herbalist on phone: Not on file    Gets together: Not on file    Attends religious service: Not on file    Active member of club or organization: Not on file    Attends meetings of clubs or organizations: Not on file    Relationship status: Not on file  . Intimate partner violence    Fear of current or ex partner: Not on file    Emotionally abused: Not on file    Physically abused: Not on file    Forced sexual activity: Not on file  Other Topics Concern  . Not on file  Social History Narrative  . Not on file     Current Outpatient Medications on File Prior to Visit  Medication Sig Dispense Refill  . amLODipine (NORVASC) 10 MG tablet Take  1 tablet (10 mg total) by mouth daily. 90 tablet 1  . atorvastatin (LIPITOR) 20 MG tablet Take 1 tablet (20 mg total) by mouth daily. 90 tablet 1  . benazepril (LOTENSIN) 20 MG tablet Take 0.5 tablets (10 mg total) by mouth daily. 90 tablet 3  . BIDIL 20-37.5 MG tablet Take 2 tablets by mouth twice daily 120 tablet 0  . chlorthalidone (HYGROTON) 25 MG tablet Take 1 tablet by mouth once daily 90 tablet 0  . Cyanocobalamin (VITAMIN B-12 PO) Take 1 tablet by mouth daily.    Marland Kitchen HYDROcodone-acetaminophen (NORCO) 7.5-325 MG tablet Take 1 tablet by mouth every 6 (six) hours as needed for moderate pain. 15 tablet 0  . meloxicam (MOBIC) 15 MG tablet Take 1 tablet (15 mg total) by mouth daily. 30 tablet 0   No current facility-administered medications on file prior to visit.      Cardiovascular studies:  EKG 04/21/2018: Atrial fibrillation with controlled ventricular response. Anterolateral ST-elevation -repolarization variant.   Echocardiogram 02/12/2018: - Left ventricle: The cavity size was normal. There was moderate   concentric hypertrophy. Systolic function was mildly reduced. The   estimated ejection fraction was in the range of 45% to 50%. Wall   motion was normal; there were no regional wall motion   abnormalities. Unable to evaluate diastolic function due to   atrial fibrillation. - Mitral valve: Moderately dilated annulus. There was mild   regurgitation. - Left atrium: The atrium was severely dilated. - Right ventricle: Systolic function was low normal. - Right atrium: The atrium was mildly dilated. - No evidence of pulmonary hypertension.  Cath 02/12/2018: LM: Distal 20% disease LAD: Ostial 20% disease Ramus: No significant disease LCx: Dominant. No significant disease RCA: Nondominant. No significant disease  LVEDP normal  Conclusion: Mild nonobstructive coronary artery disease Type 2 MI in the setting of hypertensive urgency.   Review of Systems  Constitution: Negative for decreased appetite, malaise/fatigue, weight gain and weight loss.  HENT: Negative for congestion.   Eyes: Negative for visual disturbance.  Cardiovascular: Negative for chest pain, dyspnea on exertion, leg swelling, palpitations and syncope.  Respiratory: Negative for shortness of breath.   Endocrine: Negative for cold intolerance.  Hematologic/Lymphatic: Does not bruise/bleed easily.  Skin: Negative for itching and rash.  Musculoskeletal: Negative for myalgias.  Gastrointestinal: Negative for abdominal pain, nausea and vomiting.  Genitourinary: Negative for dysuria.  Neurological: Negative for dizziness and weakness.  Psychiatric/Behavioral: The patient is not nervous/anxious.   All other systems reviewed and are negative.      Objective:    There were  no vitals filed for this visit.   Physical Exam  Constitutional: He is oriented to person, place, and time. He appears well-developed and well-nourished. No distress.  HENT:  Head: Normocephalic and atraumatic.  Eyes: Pupils are equal, round, and reactive to light. Conjunctivae are normal.  Neck: No JVD present.  Cardiovascular: Normal rate, regular rhythm and intact distal pulses.  Pulmonary/Chest: Effort normal and breath sounds normal. He has no wheezes. He has no rales.  Abdominal: Soft. Bowel sounds are normal. There is no rebound.  Musculoskeletal:        General: No edema.  Lymphadenopathy:    He has no cervical adenopathy.  Neurological: He is alert and oriented to person, place, and time. No cranial nerve deficit.  Skin: Skin is warm and dry.  Psychiatric: He has a normal mood and affect.  Nursing note and vitals reviewed.       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  Persistent atrial fibrillation:   Controlled ventricular response.  CHA2DS2VASc score: 1. Annual stroke risk: 0.6%, thus not on anticoagulation. His risk factors include obesity and suspected obstructive sleep apnea.  Encouraged regular exercise for weight loss.  Referred him for sleep study evaluation.  Given his persistent risk factors and severe left atrial dilatation, I do not think he will do well with rhythm control therapy at this time.  May consider this in future after addressing underlying risk factors.  Cardiomyopathy: Mildly reduced EF.  Suspect hypertensive cardiomyopathy.  Hypertension: Remains suboptimal.  Continue amlodipine 10 mg daily, BiDil 2 tablets of 20-37.5 mg twice daily, Chlorthalidone 25 mg daily.  Increase benazepril to 20 mg daily.  Cautioned him to avoid regular  concurrent use of BiDil and sildenafil.  I will see him back in 3 months.   Elder NegusManish J Story Conti, MD [facility] Pager: [phone] Office: [phone] If no answer Cell [phone]

## 2018-12-24 ENCOUNTER — Ambulatory Visit: Payer: Self-pay | Admitting: Cardiology

## 2018-12-24 DIAGNOSIS — Z5329 Procedure and treatment not carried out because of patient's decision for other reasons: Secondary | ICD-10-CM

## 2018-12-24 NOTE — Progress Notes (Deleted)
Patient is here for follow up visit.  Subjective:   James [name], male    DOB: January 03, 1961, 58 y.o.   MRN: [id number]   No chief complaint on file.   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 He was found to have new diagnosis of atrial fibrillation with slow ventricular response and troponin elevation.  cath showed minimal nonobstructive CAD. Troponin elevation was thought to be type 2 MI in the setting of hypertensive urgency. Anticoagulation was not recommende given low CHA2DS2VAsc score of 1.   Patient is here for hospital follow up visit and to establish cardiac care.  Patient denies any further episodes of chest pain, shortness of breath, dizziness.  He is trying to be more compliant with his diet and medications.  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  He endorses snoring at night, but has not had a sleep study.  He questions if he could use his mother's CPAP machine.  Blood pressure improved, but remains suboptimal.   Past Medical History:  Diagnosis Date  . A-fib ([place])   . Hypercholesteremia   . Hypertension      Past Surgical History:  Procedure Laterality Date  . LEFT HEART CATH AND CORONARY ANGIOGRAPHY N/A 02/12/2018   Procedure: LEFT HEART CATH AND CORONARY ANGIOGRAPHY;  Surgeon: Nigel Mormon, MD;  Location: [facility];  Service: Cardiovascular;  Laterality: N/A;     Social History   Socioeconomic History  . Marital status: Legally Separated    Spouse name: Not on file  . Number of children: 0  . Years of education: Not on file  . Highest education level: Not on file  Occupational History  . Not on file  Social Needs  . Financial resource strain: Not on file  . Food insecurity    Worry: Not on file    Inability: Not on file  . Transportation needs    Medical: Not on file    Non-medical: Not on file  Tobacco Use  . Smoking status: Never Smoker  . Smokeless tobacco: Never Used  Substance and Sexual Activity  . Alcohol use: Yes    Alcohol/week: 1.0 standard drinks    Types: 1 Standard drinks or equivalent per week  . Drug use: No  . Sexual activity: Yes  Lifestyle  . Physical activity    Days per week: Not on file    Minutes per session: Not on file  . Stress: Not on file  Relationships  . Social Herbalist on phone: Not on file    Gets together: Not on file    Attends religious service: Not on file    Active member of club or organization: Not on file    Attends meetings of clubs or organizations: Not on file    Relationship status: Not on file  . Intimate partner violence    Fear of current or ex partner: Not on file    Emotionally abused: Not on file    Physically abused: Not on file    Forced sexual activity: Not on file  Other Topics Concern  . Not on file  Social History Narrative  . Not on file     Current Outpatient Medications on File Prior to Visit  Medication Sig Dispense Refill  . amLODipine (NORVASC) 10 MG tablet Take  1 tablet (10 mg total) by mouth daily. 90 tablet 1  . atorvastatin (LIPITOR) 20 MG tablet Take 1 tablet (20 mg total) by mouth daily. 90 tablet 1  . benazepril (LOTENSIN) 20 MG tablet Take 0.5 tablets (10 mg total) by mouth daily. 90 tablet 3  . BIDIL 20-37.5 MG tablet Take 2 tablets by mouth twice daily 120 tablet 0  . chlorthalidone (HYGROTON) 25 MG tablet Take 1 tablet by mouth once daily 90 tablet 0  . Cyanocobalamin (VITAMIN B-12 PO) Take 1 tablet by mouth daily.    Marland Kitchen HYDROcodone-acetaminophen (NORCO) 7.5-325 MG tablet Take 1 tablet by mouth every 6 (six) hours as needed for moderate pain. 15 tablet 0  . meloxicam (MOBIC) 15 MG tablet Take 1 tablet (15 mg total) by mouth daily. 30 tablet 0   No current facility-administered medications on file prior to visit.      Cardiovascular studies:  EKG 04/21/2018: Atrial fibrillation with controlled ventricular response. Anterolateral ST-elevation -repolarization variant.   Echocardiogram 02/12/2018: - Left ventricle: The cavity size was normal. There was moderate   concentric hypertrophy. Systolic function was mildly reduced. The   estimated ejection fraction was in the range of 45% to 50%. Wall   motion was normal; there were no regional wall motion   abnormalities. Unable to evaluate diastolic function due to   atrial fibrillation. - Mitral valve: Moderately dilated annulus. There was mild   regurgitation. - Left atrium: The atrium was severely dilated. - Right ventricle: Systolic function was low normal. - Right atrium: The atrium was mildly dilated. - No evidence of pulmonary hypertension.  Cath 02/12/2018: LM: Distal 20% disease LAD: Ostial 20% disease Ramus: No significant disease LCx: Dominant. No significant disease RCA: Nondominant. No significant disease  LVEDP normal  Conclusion: Mild nonobstructive coronary artery disease Type 2 MI in the setting of hypertensive urgency.   Labs: Results for XAI, FRERKING (MRN [id number]) as of 12/24/2018 10:02  Ref. Range 02/12/2018 12:40 02/12/2018 19:08 02/13/2018 12:29 03/09/2018 17:14  Sodium Latest Ref Range: 135 - 145 mEq/L   137 138  Potassium Latest Ref Range: 3.5 - 5.1 mEq/L   3.9 3.5  Chloride Latest Ref Range: 96 - 112 mEq/L   105 101  CO2 Latest Ref Range: 19 - 32 mEq/L   23 28  Glucose Latest Ref Range: 70 - 99 mg/dL   98 90  BUN Latest Ref Range: 6 - 23 mg/dL   18 26 (H)  Creatinine Latest Ref Range: 0.40 - 1.50 mg/dL   5.36 (H) 1.44  Calcium Latest Ref Range: 8.4 - 10.5 mg/dL   9.2 9.5  Anion gap Latest Ref Range: 5 - 15    9   GFR Latest Ref Range: >60.00 mL/min    73.52  GFR, Est Non African American Latest Ref Range: >60 mL/min   >60   GFR, Est African American Latest Ref Range: >60 mL/min   >60   Troponin I Latest  Ref Range: <0.03 ng/mL 0.07 (HH) 0.06 (HH)      Review of Systems  Constitution: Negative for decreased appetite, malaise/fatigue, weight gain and weight loss.  HENT: Negative for congestion.   Eyes: Negative for visual disturbance.  Cardiovascular: Negative for chest pain, dyspnea on exertion, leg swelling, palpitations and syncope.  Respiratory: Negative for shortness of breath.   Endocrine: Negative for cold intolerance.  Hematologic/Lymphatic: Does not bruise/bleed easily.  Skin: Negative for itching and rash.  Musculoskeletal: Negative for myalgias.  Gastrointestinal: Negative for  abdominal pain, nausea and vomiting.  Genitourinary: Negative for dysuria.  Neurological: Negative for dizziness and weakness.  Psychiatric/Behavioral: The patient is not nervous/anxious.   All other systems reviewed and are negative.      Objective:    There were no vitals filed for this visit.   Physical Exam  Constitutional: He is oriented to person, place, and time. He appears well-developed and well-nourished. No distress.  HENT:  Head: Normocephalic and atraumatic.  Eyes: Pupils are equal, round, and reactive to light. Conjunctivae are normal.  Neck: No JVD present.  Cardiovascular: Normal rate, regular rhythm and intact distal pulses.  Pulmonary/Chest: Effort normal and breath sounds normal. He has no wheezes. He has no rales.  Abdominal: Soft. Bowel sounds are normal. There is no rebound.  Musculoskeletal:        General: No edema.  Lymphadenopathy:    He has no cervical adenopathy.  Neurological: He is alert and oriented to person, place, and time. No cranial nerve deficit.  Skin: Skin is warm and dry.  Psychiatric: He has a normal mood and affect.  Nursing note and vitals reviewed.       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  Persistent atrial fibrillation:   Controlled ventricular response.  CHA2DS2VASc score: 1. Annual stroke risk: 0.6%, thus not on anticoagulation. His risk factors include obesity and suspected obstructive sleep apnea.  Encouraged regular exercise for weight loss.  Referred him for sleep study evaluation.  Given his persistent risk factors and severe left atrial dilatation, I do not think he will do well with rhythm control therapy at this time.  May consider this in future after addressing underlying risk factors.  Cardiomyopathy: Mildly reduced EF.  Suspect hypertensive cardiomyopathy.  Hypertension: Remains suboptimal.  Continue amlodipine 10 mg daily, BiDil 2 tablets of 20-37.5 mg twice daily, Chlorthalidone 25 mg daily.  Increase benazepril to 20 mg daily.  Cautioned him to avoid regular concurrent use of BiDil and sildenafil.  ***  Jhada Risk Emiliano DyerJ Zlaty Alexa, MD [facility] Pager: [phone] Office: [phone] If no answer Cell [phone]

## 2019-08-30 ENCOUNTER — Telehealth: Payer: Self-pay

## 2019-08-31 ENCOUNTER — Other Ambulatory Visit: Payer: Self-pay | Admitting: Cardiology

## 2019-08-31 ENCOUNTER — Other Ambulatory Visit: Payer: Self-pay

## 2019-08-31 DIAGNOSIS — I10 Essential (primary) hypertension: Secondary | ICD-10-CM

## 2019-08-31 DIAGNOSIS — E7800 Pure hypercholesterolemia, unspecified: Secondary | ICD-10-CM

## 2019-08-31 MED ORDER — AMLODIPINE BESYLATE 10 MG PO TABS: 10.0000 mg | ORAL_TABLET | Freq: Every day | ORAL | 1 refills | Status: DC

## 2019-08-31 MED ORDER — BENAZEPRIL HCL 20 MG PO TABS: 10.0000 mg | ORAL_TABLET | Freq: Every day | ORAL | 1 refills | Status: DC

## 2019-08-31 MED ORDER — ATORVASTATIN CALCIUM 20 MG PO TABS: 20.0000 mg | ORAL_TABLET | Freq: Every day | ORAL | 1 refills | Status: DC

## 2019-08-31 MED ORDER — CHLORTHALIDONE 25 MG PO TABS: 25.0000 mg | ORAL_TABLET | Freq: Every day | ORAL | 1 refills | Status: DC

## 2019-08-31 MED ORDER — BIDIL 20-37.5 MG PO TABS: 2.0000 | ORAL_TABLET | Freq: Two times a day (BID) | ORAL | 1 refills | Status: DC

## 2019-08-31 MED FILL — AMLODIPINE BESYLATE 10 MG T: 10 | 30 days supply | Qty: 30 | Fill #0

## 2019-08-31 MED FILL — ATORVASTATIN CALCIUM 20 MG: 20 | 30 days supply | Qty: 30 | Fill #0

## 2019-08-31 MED FILL — BENAZEPRIL HCL 20 MG TABLET: 20 | 30 days supply | Qty: 15 | Fill #0

## 2019-08-31 MED FILL — CHLORTHALIDONE 25 MG TAB: 25 | 30 days supply | Qty: 30 | Fill #0

## 2019-09-01 ENCOUNTER — Other Ambulatory Visit: Payer: Self-pay

## 2019-09-26 ENCOUNTER — Ambulatory Visit: Payer: Self-pay | Admitting: Cardiology

## 2019-10-24 MED FILL — AMLODIPINE BESYLATE 10 MG T: 10 | 30 days supply | Qty: 30 | Fill #1

## 2019-10-24 MED FILL — BENAZEPRIL HCL 20 MG TABLET: 20 | 30 days supply | Qty: 15 | Fill #1

## 2019-10-24 MED FILL — ATORVASTATIN CALCIUM 20 MG: 20 | 30 days supply | Qty: 30 | Fill #1

## 2019-10-24 MED FILL — CHLORTHALIDONE 25 MG TAB: 25 | 30 days supply | Qty: 30 | Fill #1

## 2019-11-14 MED FILL — FUROSEMIDE 20 MG TABS: 20 | 5 days supply | Qty: 10 | Fill #0

## 2019-11-18 MED FILL — predniSONE 10 MG TABS: 10 | 6 days supply | Qty: 21 | Fill #0

## 2019-12-15 ENCOUNTER — Other Ambulatory Visit: Payer: Self-pay | Admitting: Internal Medicine

## 2019-12-15 MED FILL — MITIGARE 0.6 MG CAPS: 0.6 | 10 days supply | Qty: 20 | Fill #0

## 2019-12-16 MED FILL — CHLORTHALIDONE 25 MG TAB: 25 | 30 days supply | Qty: 30 | Fill #2

## 2019-12-16 MED FILL — BENAZEPRIL HCL 20 MG TABLET: 20 | 30 days supply | Qty: 15 | Fill #2

## 2019-12-16 MED FILL — ATORVASTATIN CALCIUM 20 MG: 20 | 30 days supply | Qty: 30 | Fill #2

## 2019-12-16 MED FILL — AMLODIPINE BESYLATE 10 MG T: 10 | 30 days supply | Qty: 30 | Fill #2

## 2020-02-13 MED FILL — BENAZEPRIL HCL 20 MG TABLET: 20 | 30 days supply | Qty: 15 | Fill #2

## 2020-02-13 MED FILL — AMLODIPINE BESYLATE 10 MG T: 10 | 30 days supply | Qty: 30 | Fill #2

## 2020-02-16 ENCOUNTER — Telehealth: Payer: Self-pay | Admitting: Student

## 2020-02-16 DIAGNOSIS — I10 Essential (primary) hypertension: Secondary | ICD-10-CM

## 2020-02-16 MED ORDER — CHLORTHALIDONE 25 MG PO TABS: 25.0000 mg | ORAL_TABLET | Freq: Every day | ORAL | 0 refills | Status: DC

## 2020-02-16 NOTE — Telephone Encounter (Signed)
ON-CALL CARDIOLOGY 02/16/20  Patient's name: James [name].   MRN: [id number].    DOB: 1960-12-07 Primary care provider: Deeann Saint, MD. Primary cardiologist: Dr. Elveria Rising regarding this patient's care today: Patient called today reporting he is been out of his chlorthalidone for approximately 1 week and has noticed increased swelling in his ankles.  Patient was recently evaluated 02/14/2020 in [facility] emergency department and diagnosed with pneumonia for which she is taking antibiotics.  Patient was last seen in our office October 2020 by Dr. Rosemary Holms was recommended to follow-up in 3 months.  However due to insurance and cost concerns patient was lost to follow-up.  Personally reviewed patient's labs done on 02/14/2020.  Renal function is stable.  Impression:   ICD-10-CM   1. Essential hypertension  I10 chlorthalidone (HYGROTON) 25 MG tablet    Basic metabolic panel    Meds ordered this encounter  Medications  . chlorthalidone (HYGROTON) 25 MG tablet    Sig: Take 1 tablet (25 mg total) by mouth daily.    Dispense:  30 tablet    Refill:  0    Orders Placed This Encounter  Procedures  . Basic metabolic panel    Recommendations: We will refill patient's chlorthalidone 25 mg daily with a 30-day supply. Will repeat BMP in 1 week.  Informed patient he must follow-up in our office or establish care with a cardiologist office that accepts his insurance.  Also encourage patient to establish care with PCP.  Patient verbalized understanding and agreement.  Telephone encounter total time: 12 minutes     Rayford Halsted, PA-C 02/16/2020, 2:38 PM Office: [phone]

## 2020-02-25 IMAGING — DX LEFT KNEE 3 VIEWS
3 series · 3 of 3 positions shown · non-contrast
Comparison: None.

CLINICAL DATA: Chronic left knee pain.

EXAM:
LEFT KNEE 3 VIEWS

[knee ap]
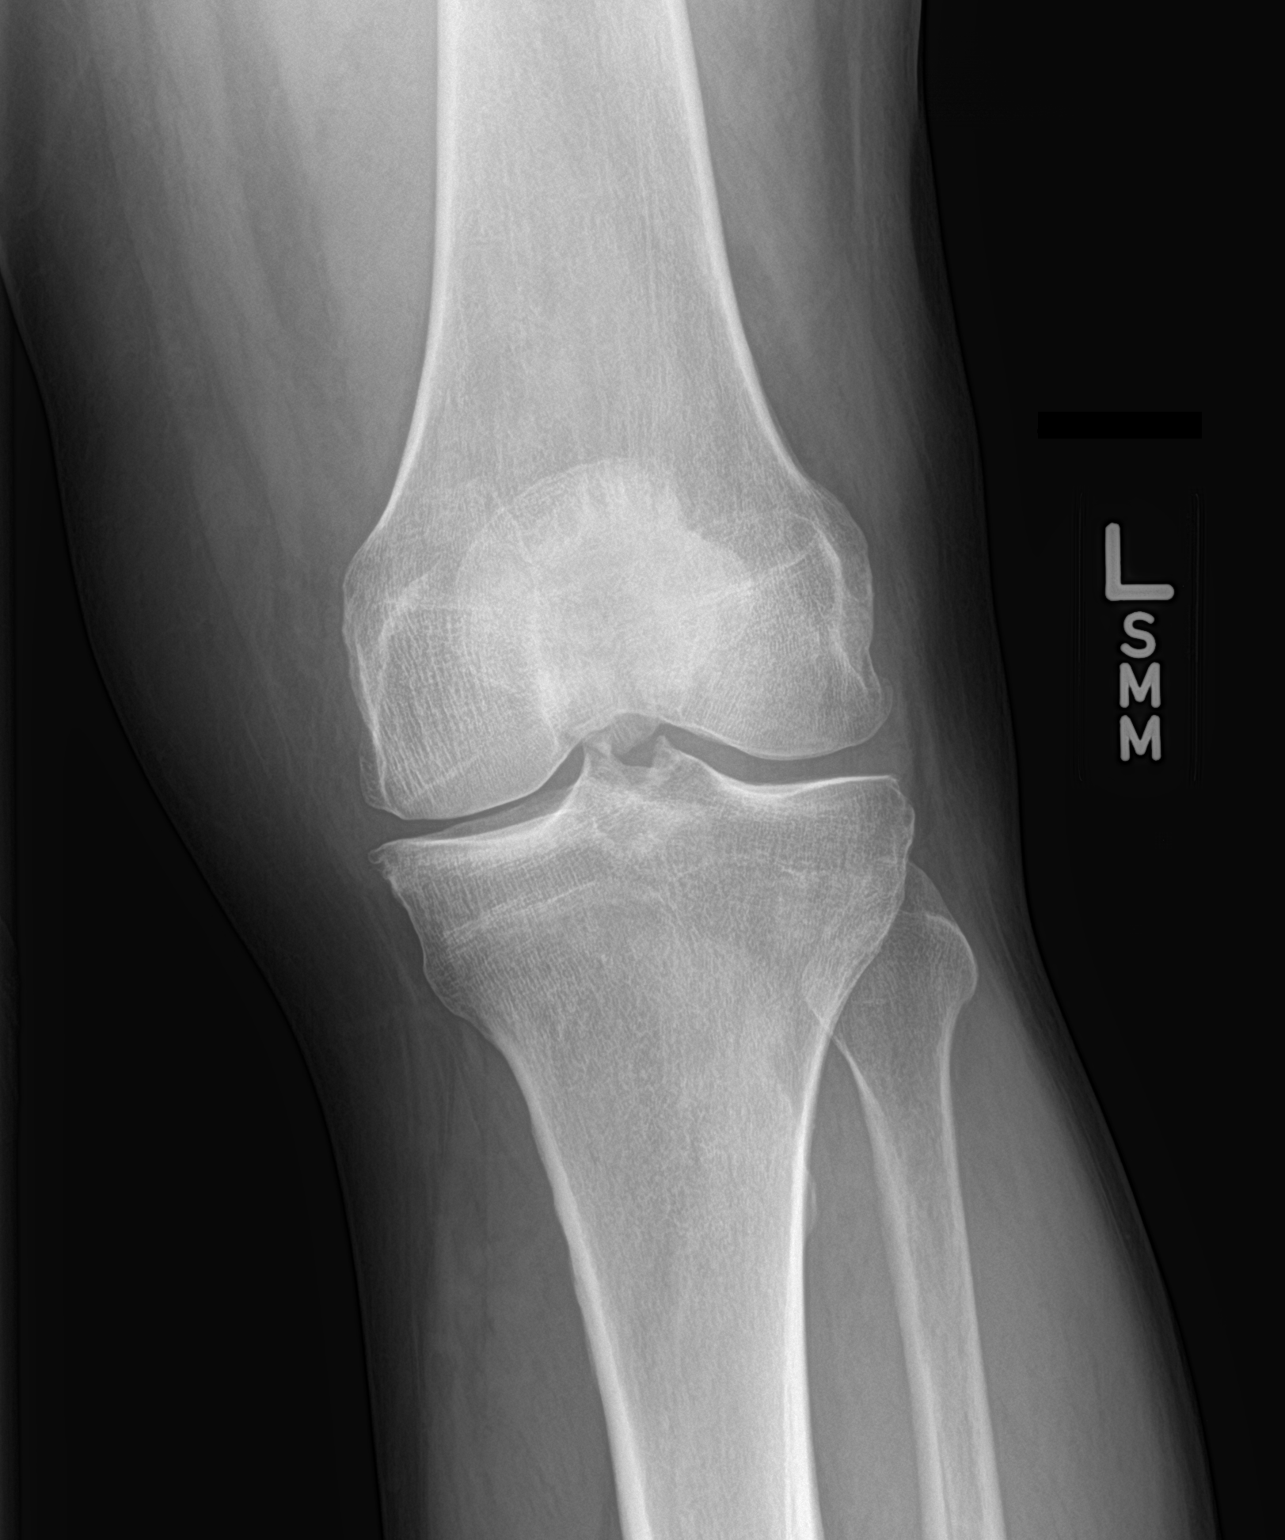

[knee lat]
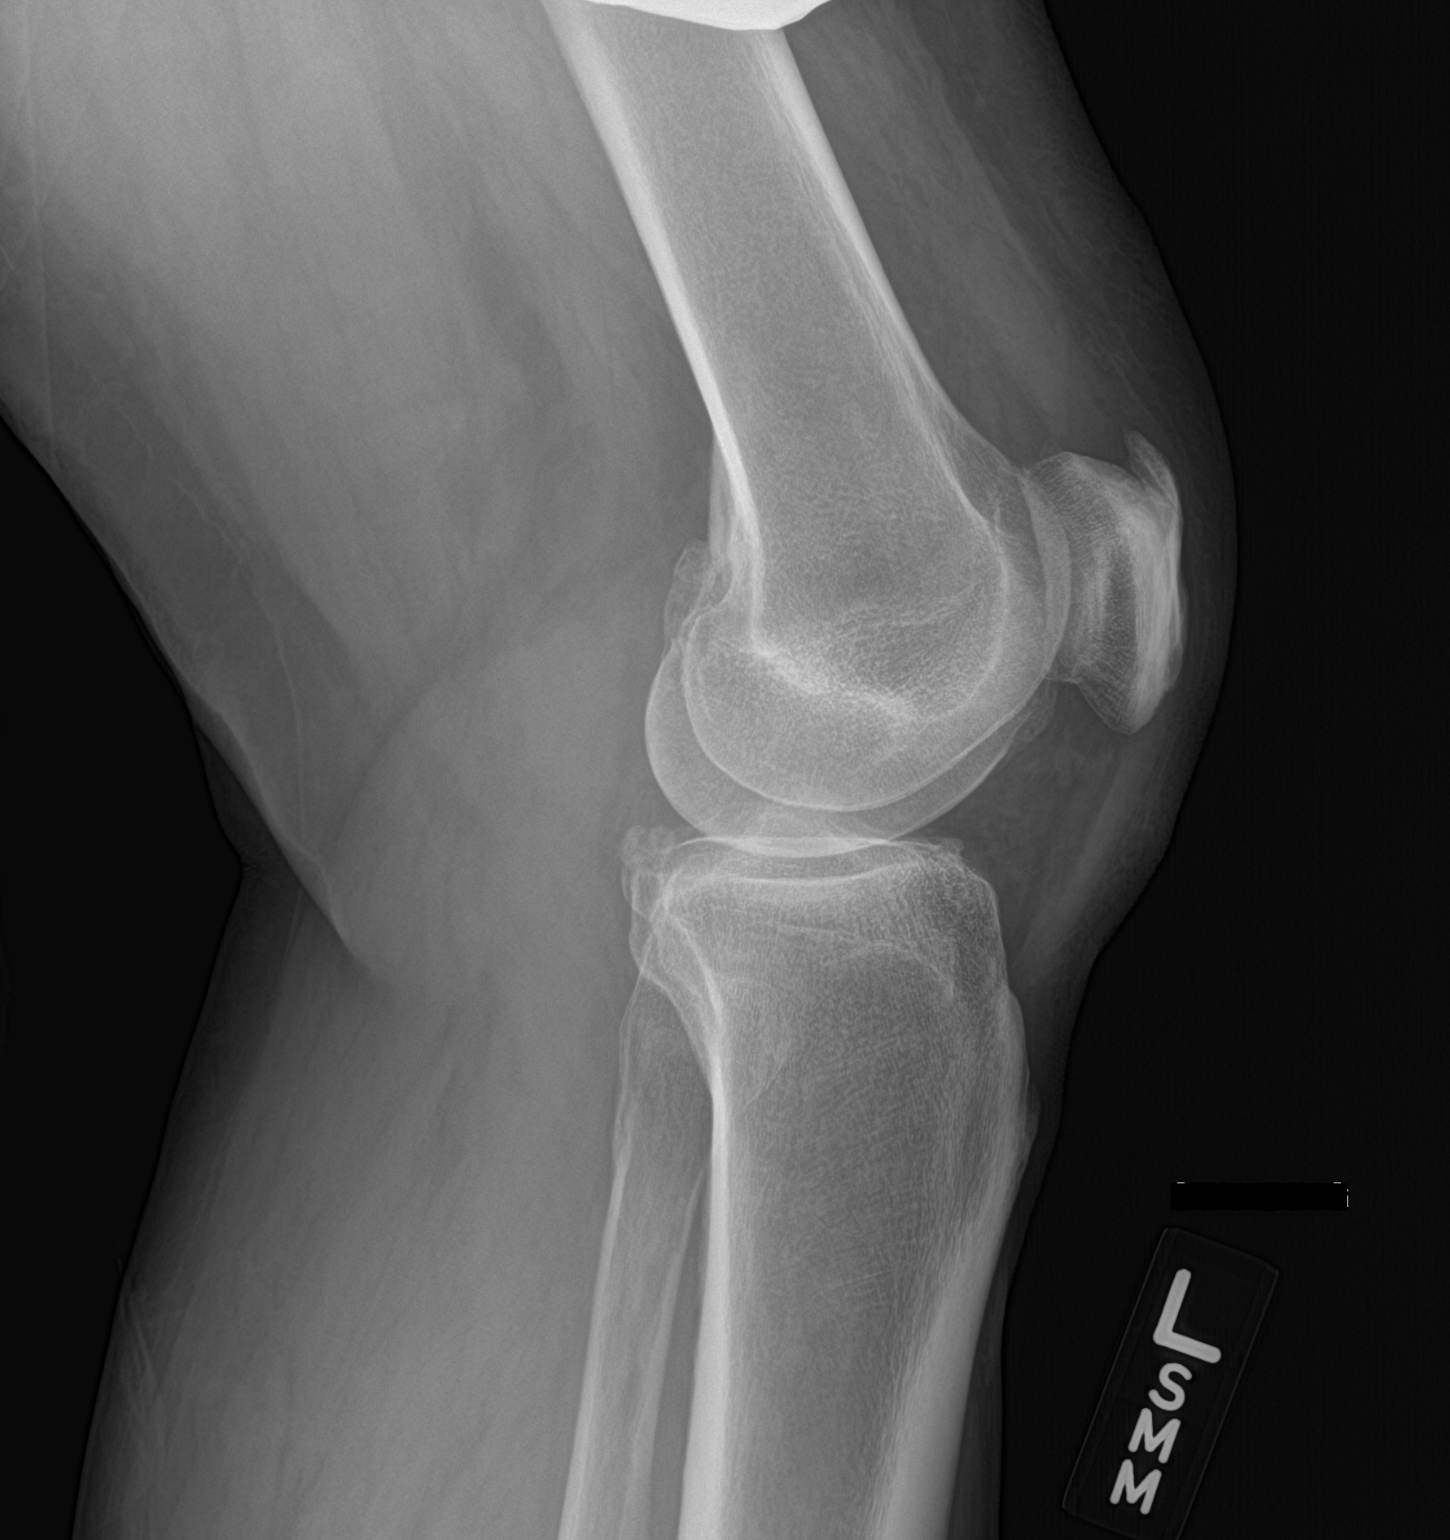

[knee sunrise]
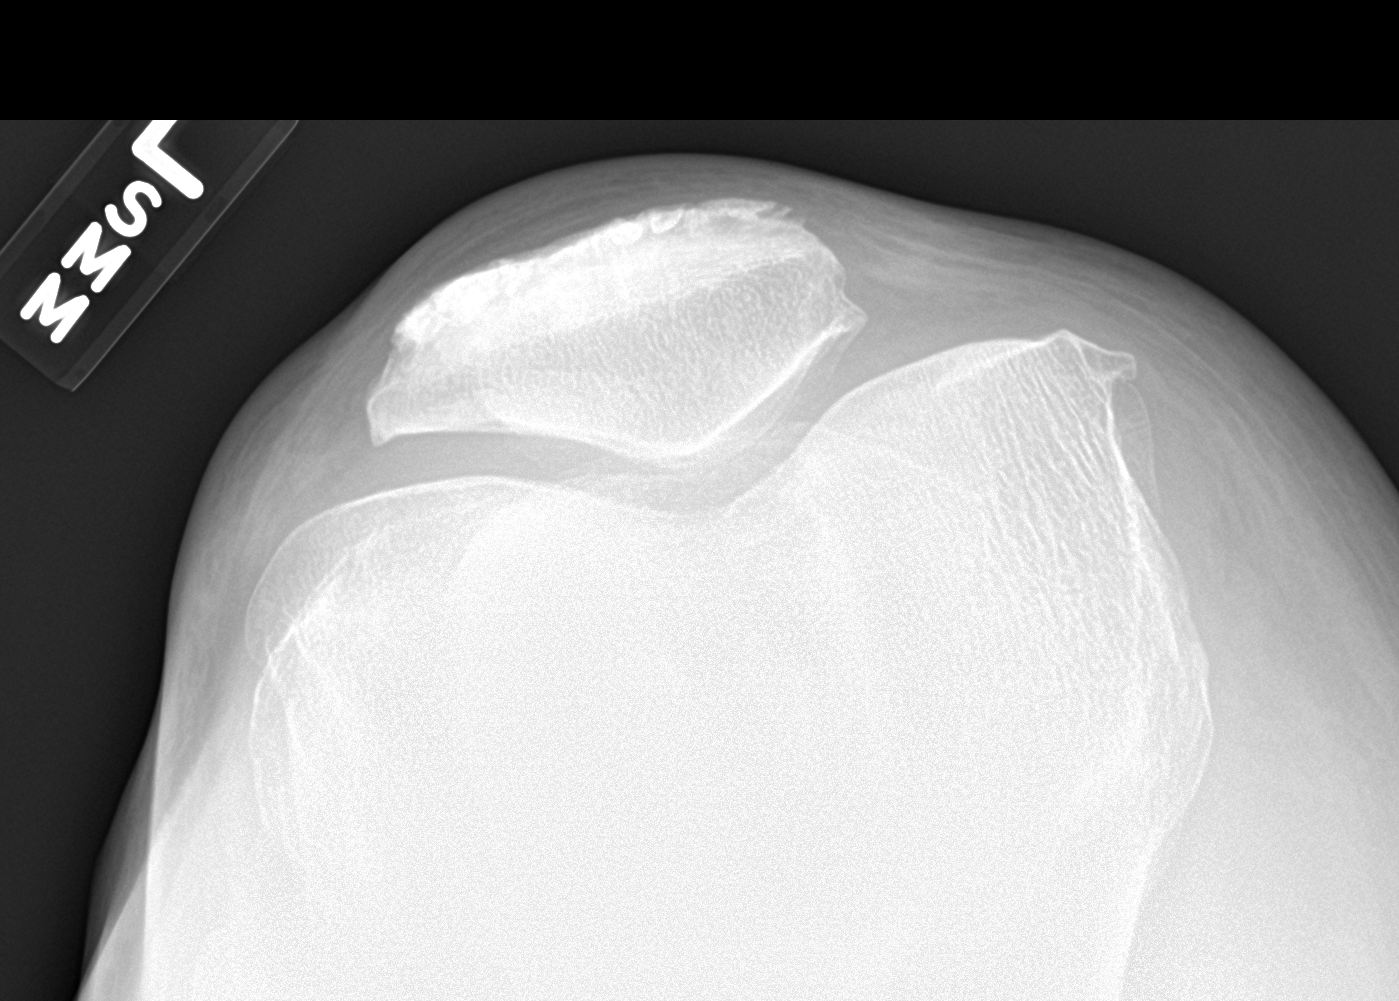

[3 of 3 positions shown; findings below may reference images not displayed]

FINDINGS: No acute fracture or dislocation. No joint effusion. Mild medial
compartment joint space narrowing. Tiny tricompartmental
osteophytes. Bone mineralization is normal. Soft tissues are
unremarkable.
IMPRESSION: 1. Mild medial compartment osteoarthritis.
2.  No acute osseous abnormality.

## 2020-04-03 ENCOUNTER — Other Ambulatory Visit: Payer: Self-pay | Admitting: Emergency Medicine

## 2020-04-03 MED FILL — AMLODIPINE BESYLATE 10 MG T: 10 | 30 days supply | Qty: 30 | Fill #0

## 2020-04-03 MED FILL — traMADol HCL 50 MG TABS: 50 | 4 days supply | Qty: 12 | Fill #0

## 2020-04-03 MED FILL — BENAZEPRIL HCL 20 MG TABLET: 20 | 30 days supply | Qty: 30 | Fill #0

## 2020-04-03 MED FILL — predniSONE 10 MG TABS: 10 | 6 days supply | Qty: 21 | Fill #0

## 2020-04-03 MED FILL — ATORVASTATIN CALCIUM 20 MG: 20 | 30 days supply | Qty: 30 | Fill #0

## 2020-04-17 ENCOUNTER — Other Ambulatory Visit: Payer: Self-pay | Admitting: Emergency Medicine

## 2020-04-17 MED FILL — FUROSEMIDE 40 MG TAB: 40 | 90 days supply | Qty: 90 | Fill #0

## 2020-06-06 ENCOUNTER — Emergency Department (EMERGENCY_DEPARTMENT_HOSPITAL): Payer: Self-pay

## 2020-06-06 ENCOUNTER — Other Ambulatory Visit: Payer: Self-pay

## 2020-06-06 ENCOUNTER — Emergency Department (HOSPITAL_COMMUNITY)
Admission: EM | Admit: 2020-06-06 | Discharge: 2020-06-06 | Disposition: A | Discharge status: Home / Self Care | Payer: Self-pay | Attending: Emergency Medicine | Admitting: Emergency Medicine

## 2020-06-06 ENCOUNTER — Emergency Department (HOSPITAL_COMMUNITY): Payer: Self-pay

## 2020-06-06 DIAGNOSIS — Z79899 Other long term (current) drug therapy: Secondary | ICD-10-CM | POA: Insufficient documentation

## 2020-06-06 DIAGNOSIS — M109 Gout, unspecified: Secondary | ICD-10-CM | POA: Insufficient documentation

## 2020-06-06 DIAGNOSIS — R609 Edema, unspecified: Secondary | ICD-10-CM

## 2020-06-06 DIAGNOSIS — M7989 Other specified soft tissue disorders: Secondary | ICD-10-CM

## 2020-06-06 DIAGNOSIS — I10 Essential (primary) hypertension: Secondary | ICD-10-CM | POA: Insufficient documentation

## 2020-06-06 MED ORDER — HYDROCODONE-ACETAMINOPHEN 5-325 MG PO TABS
1.0000 | ORAL_TABLET | Freq: Once | ORAL | Status: AC
Start: 2020-06-06 — End: 2020-06-06
  Administered 2020-06-06: 1 via ORAL
  Filled 2020-06-06: qty 1

## 2020-06-06 MED ORDER — INDOMETHACIN 50 MG PO CAPS
50.0000 mg | ORAL_CAPSULE | Freq: Two times a day (BID) | ORAL | 0 refills | Status: DC
  Filled 2020-06-06: qty 10, 5d supply, fill #0

## 2020-06-06 MED ORDER — INDOMETHACIN 25 MG PO CAPS
50.0000 mg | ORAL_CAPSULE | Freq: Once | ORAL | Status: AC
  Administered 2020-06-06: 50 mg via ORAL
  Filled 2020-06-06: qty 2

## 2020-06-06 MED ORDER — INDOMETHACIN 50 MG PO CAPS
50.0000 mg | ORAL_CAPSULE | Freq: Three times a day (TID) | ORAL | 0 refills | Status: AC
  Filled 2020-06-06: qty 15, 5d supply, fill #0

## 2020-06-06 NOTE — ED Notes (Signed)
Discharge instructions explained and reviewed, pt verbalized understanding.

## 2020-06-06 NOTE — ED Provider Notes (Signed)
[facility] ([facility]) EMERGENCY DEPARTMENT Provider Note   CSN: [id number] Arrival date & time: 06/06/20  1254     History Chief Complaint  Patient presents with  . Foot Pain    James [name] is a 60 y.o. male.  HPI 60 year old male with a history of atrial fibrillation not on anticoagulation, hypertension, hypercholesteremia presents to the ER with right leg pain and swelling.  Patient states that he has had pain and swelling to this leg intermittently over the last several years.  He states that he had an injury several years ago where it did swell up, but he never sought medical care and did not have any imaging.  He states his right leg began to swell again approximately a week ago.  He has pain with bearing weight and has pain near his ankle.  He has also noticed that his calf is swollen.  He denies any injuries or falls recently.  Is any numbness or tingling.  He denies any fevers or chills.  He is a [employer], with his last trip being approximately 2 weeks ago.  Onset of swelling and pain started about a week after he returned from his last trip.  Denies any chest pain or shortness of breath.  He is not on any anticoagulation, was being seen by PCP and they were considering starting him on anticoagulation for his A. fib, but his insurance lapsed due to nonpayment.  He is currently in the process of getting insurance again and will reportedly have coverage starting May 1.  He also states that he was told that he has a history of gout, though he is not sure if this is was causing his symptoms. Patient does admit to frequent alcohol use  Past Medical History:  Diagnosis Date  . A-fib (HCC)   . Hypercholesteremia   . Hypertension     Patient Active Problem List   Diagnosis Date Noted  . Snoring 12/10/2018  . Hypertensive emergency 02/12/2018  . Acute pulmonary edema (HCC) 02/12/2018  . Unspecified atrial fibrillation (HCC) 02/12/2018  . Elevated troponin  02/12/2018  . Hypertensive urgency 02/12/2018  . Acute respiratory failure with hypoxia (HCC) 02/11/2018  . Essential hypertension 10/04/2014    Past Surgical History:  Procedure Laterality Date  . LEFT HEART CATH AND CORONARY ANGIOGRAPHY N/A 02/12/2018   Procedure: LEFT HEART CATH AND CORONARY ANGIOGRAPHY;  Surgeon: Elder Negus, MD;  Location: [facility];  Service: Cardiovascular;  Laterality: N/A;       Family History  Problem Relation Age of Onset  . Diabetes Mother   . CAD Brother     Social History   Tobacco Use  . Smoking status: Never Smoker  . Smokeless tobacco: Never Used  Substance Use Topics  . Alcohol use: Yes    Alcohol/week: 1.0 standard drink    Types: 1 Standard drinks or equivalent per week  . Drug use: No    Home Medications Prior to Admission medications   Medication Sig Start Date End Date Taking? Authorizing Provider  amLODipine (NORVASC) 10 MG tablet TAKE 1 TABLET (10 MG TOTAL) BY MOUTH DAILY. 04/03/20 04/03/21  Marlon Pel, PA-C  amLODipine (NORVASC) 10 MG tablet TAKE 1 TABLET (10 MG TOTAL) BY MOUTH DAILY. 08/31/19 08/30/20  Patwardhan, Anabel Bene, MD  atorvastatin (LIPITOR) 20 MG tablet Take 1 tablet (20 mg total) by mouth daily. 08/31/19   Patwardhan, Manish J, MD  atorvastatin (LIPITOR) 20 MG tablet TAKE 1 TABLET (20 MG  TOTAL) BY MOUTH DAILY FOR 30 DAYS. 04/03/20 04/03/21  Marlon Pel, PA-C  benazepril (LOTENSIN) 20 MG tablet TAKE 1 TABLET (20 MG TOTAL) BY MOUTH DAILY FOR 30 DAYS. 04/03/20 04/03/21  Marlon Pel, PA-C  benazepril (LOTENSIN) 20 MG tablet TAKE 0.5 TABLETS (10 MG TOTAL) BY MOUTH DAILY. 08/31/19 08/30/20  Patwardhan, Anabel Bene, MD  chlorthalidone (HYGROTON) 25 MG tablet Take 1 tablet (25 mg total) by mouth daily. 02/16/20   Cantwell, Celeste C, PA-C  Colchicine 0.6 MG CAPS TAKE 1 CAPSULE (0.6 MG TOTAL) BY MOUTH 2 TIMES DAILY FOR 10 DAYS. 12/15/19 12/14/20  Tonia Ghent, PA-C  Cyanocobalamin (VITAMIN B-12 PO) Take 1 tablet  by mouth daily.    [provider]  furosemide (LASIX) 40 MG tablet TAKE 1 TABLET (40 MG TOTAL) BY MOUTH DAILY AS NEEDED (SWELLING). 04/17/20 04/17/21  Leslye Peer, MD  HYDROcodone-acetaminophen (NORCO) 7.5-325 MG tablet Take 1 tablet by mouth every 6 (six) hours as needed for moderate pain. 10/24/18   Eustace Moore, MD  indomethacin (INDOCIN) 50 MG capsule Take 1 capsule (50 mg total) by mouth 3 (three) times daily with meals for 5 days. 06/06/20 06/11/20  Mare Ferrari, PA-C  isosorbide-hydrALAZINE (BIDIL) 20-37.5 MG tablet Take 2 tablets by mouth 2 (two) times daily. 08/31/19   Patwardhan, Anabel Bene, MD  meloxicam (MOBIC) 15 MG tablet Take 1 tablet (15 mg total) by mouth daily. 10/24/18   Eustace Moore, MD  predniSONE (DELTASONE) 10 MG tablet TAKE 6 TABS BY MOUTH ON DAY 1; 5 TABS ON DAY 2; 4 TABS ON DAY 3; 3 TABS ON DAY 4; 2 TABS ON DAY 5; 1 TAB ON DAY 6 THEN STOP 04/03/20 04/03/21  Marlon Pel, PA-C  traMADol (ULTRAM) 50 MG tablet TAKE 1 TABLET (50 MG TOTAL) BY MOUTH EVERY 8 (EIGHT) HOURS AS NEEDED FOR UP TO 4 DAYS. 04/03/20 10/02/20  Marlon Pel, PA-C    Allergies    Patient has no known allergies.  Review of Systems   Review of Systems  Constitutional: Negative for chills and fever.  HENT: Negative for ear pain and sore throat.   Eyes: Negative for pain and visual disturbance.  Respiratory: Negative for cough and shortness of breath.   Cardiovascular: Positive for leg swelling. Negative for chest pain and palpitations.  Gastrointestinal: Negative for abdominal pain and vomiting.  Genitourinary: Negative for dysuria and hematuria.  Musculoskeletal: Positive for arthralgias, gait problem and joint swelling. Negative for back pain.  Skin: Negative for color change and rash.  Neurological: Negative for seizures and syncope.  All other systems reviewed and are negative.   Physical Exam Updated Vital Signs BP (!) 152/61   Pulse (!) 42   Temp 98.6 F (37 C) (Oral)    Resp 18   SpO2 98%   Physical Exam Vitals and nursing note reviewed.  Constitutional:      General: He is not in acute distress.    Appearance: He is well-developed. He is not ill-appearing, toxic-appearing or diaphoretic.  HENT:     Head: Normocephalic and atraumatic.     Mouth/Throat:     Mouth: Mucous membranes are moist.     Pharynx: Oropharynx is clear.  Eyes:     Conjunctiva/sclera: Conjunctivae normal.  Cardiovascular:     Rate and Rhythm: Normal rate and regular rhythm.     Pulses: Normal pulses.     Heart sounds: Normal heart sounds. No murmur heard.   Pulmonary:     Effort:  Pulmonary effort is normal. No respiratory distress.     Breath sounds: Normal breath sounds.  Abdominal:     General: Abdomen is flat.     Palpations: Abdomen is soft.     Tenderness: There is no abdominal tenderness.  Musculoskeletal:        General: Swelling and tenderness present. No signs of injury.     Cervical back: Neck supple.     Right lower leg: Edema present.     Left lower leg: No edema.     Comments: Right calf mildly swollen compared to left.  No overlying warmth, erythema.  Dorsal aspect of the left foot is mildly warm with some mild erythema.  2+ DP pulses bilaterally.  Point tenderness to the medial malleolus on the right ankle as well.  Mildly limited dorsiflexion and plantarflexion of his right ankle.  Able to move all 5 toes without difficulty.  No wounds, abscesses  Skin:    General: Skin is warm and dry.     Coloration: Skin is not pale.     Findings: Erythema present. No bruising.  Neurological:     General: No focal deficit present.     Mental Status: He is alert and oriented to person, place, and time.     Sensory: No sensory deficit.     Motor: No weakness.  Psychiatric:        Mood and Affect: Mood normal.        Behavior: Behavior normal.     ED Results / Procedures / Treatments   Labs (all labs ordered are listed, but only abnormal results are  displayed) Labs Reviewed - No data to display  EKG None  Radiology DG Tibia/Fibula Right  Result Date: 06/06/2020 CLINICAL DATA:  Right foot, ankle and lower leg pain for 1 day. No injury. EXAM: RIGHT TIBIA AND FIBULA - 2 VIEW COMPARISON:  None. FINDINGS: No fracture or bone lesion. Knee and ankle joints are normally spaced and aligned. No significant arthropathic changes. Soft tissues are unremarkable. IMPRESSION: Negative. Electronically Signed   By: Amie Portland M.D.   On: 06/06/2020 14:41   DG Ankle Complete Right  Result Date: 06/06/2020 CLINICAL DATA:  Acute right ankle pain without known injury. EXAM: RIGHT ANKLE - COMPLETE 3+ VIEW COMPARISON:  None. FINDINGS: There is no evidence of fracture, dislocation, or joint effusion. There is no evidence of arthropathy or other focal bone abnormality. Soft tissues are unremarkable. IMPRESSION: Negative. Electronically Signed   By: Lupita Raider M.D.   On: 06/06/2020 14:41   DG Foot Complete Right  Result Date: 06/06/2020 CLINICAL DATA:  Right foot, ankle and lower leg pain for 1 day. No injury. EXAM: RIGHT FOOT COMPLETE - 3+ VIEW COMPARISON:  None. FINDINGS: No fracture or bone lesion. Mild hallux valgus and mild degenerative changes at the first metatarsophalangeal joint. Remaining joints normally spaced and aligned. Small plantar and small to moderate dorsal calcaneal spurs. Bony prominence from the dorsal anterior talus. Soft tissues are unremarkable. IMPRESSION: 1. No fracture or acute finding. 2. 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 3. Calcaneal spurs. Electronically Signed   By: Amie Portland M.D.   On: 06/06/2020 14:39   VAS [place] LOWER EXTREMITY VENOUS (DVT) (ONLY [facility])  Result Date: 06/06/2020  Lower Venous DVT Study Indications: Edema, and Swelling.  Comparison Study: no prior Performing Technologist: Blanch Media RVS  Examination Guidelines: A complete evaluation includes B-mode imaging, spectral  Doppler, color Doppler, and power  Doppler as needed of all accessible portions of each vessel. Bilateral testing is considered an integral part of a complete examination. Limited examinations for reoccurring indications may be performed as noted. The reflux portion of the exam is performed with the patient in reverse Trendelenburg.  +---------+---------------+---------+-----------+----------+--------------+ RIGHT    CompressibilityPhasicitySpontaneityPropertiesThrombus Aging +---------+---------------+---------+-----------+----------+--------------+ CFV      Full           Yes      Yes                                 +---------+---------------+---------+-----------+----------+--------------+ SFJ      Full                                                        +---------+---------------+---------+-----------+----------+--------------+ FV Prox  Full                                                        +---------+---------------+---------+-----------+----------+--------------+ FV Mid   Full                                                        +---------+---------------+---------+-----------+----------+--------------+ FV DistalFull                                                        +---------+---------------+---------+-----------+----------+--------------+ PFV      Full                                                        +---------+---------------+---------+-----------+----------+--------------+ POP      Full           Yes      Yes                                 +---------+---------------+---------+-----------+----------+--------------+ PTV      Full                                                        +---------+---------------+---------+-----------+----------+--------------+ PERO     Full                                                        +---------+---------------+---------+-----------+----------+--------------+    +----+---------------+---------+-----------+----------+--------------+  LEFTCompressibilityPhasicitySpontaneityPropertiesThrombus Aging +----+---------------+---------+-----------+----------+--------------+ CFV Full           Yes      Yes                                 +----+---------------+---------+-----------+----------+--------------+     Summary: RIGHT: - There is no evidence of deep vein thrombosis in the lower extremity.  - No cystic structure found in the popliteal fossa.  LEFT: - No evidence of common femoral vein obstruction.  *See table(s) above for measurements and observations. Electronically signed by Sherald Hesshristopher Clark MD on 06/06/2020 at 2:21:59 PM.    Final     Procedures Procedures   Medications Ordered in ED Medications  HYDROcodone-acetaminophen (NORCO/VICODIN) 5-325 MG per tablet 1 tablet (has no administration in time range)  indomethacin (INDOCIN) capsule 50 mg (has no administration in time range)    ED Course  I have reviewed the triage vital signs and the nursing notes.  Pertinent labs & imaging results that were available during my care of the patient were reviewed by me and considered in my medical decision making (see chart for details).    MDM Rules/Calculators/A&P                          60 year old male here with right leg pain and swelling.  On arrival, he is hypotensive with a blood pressure of 182/92, afebrile, nontachycardic, tachypneic or hypoxic.  Physical exam with mildly swollen calf on the right compared to the left, but no significant warmth, erythema.  Mildly warm and erythematous dorsal aspect of the right foot.  Neurovascularly intact.  Patient is not on any anticoagulation.  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  Will order ultrasound.  Patient also endorses a remote injury history, will get plain films of the tib-fib, ankle,  foot.  Also considering gout as a cause of his pain, though slightly atypical given point tenderness to the medial aspect of the right ankle.  Basic labs ordered as well.  Pain medication provided.  EKG consistent with atrial fibrillation with no evidence of RVR.  Patient denying any chest pain or shortness of breath.  DVT study is negative.  Plain films without any acute or chronic fractures.  Clinical presentation could be consistent with gout.  Has a history of this in the past, per chart review, has been given indomethacin and this has helped.  Given he is a diabetic, will hold steroids at this time.  Will prescribe indomethacin.  Stressed follow-up with his PCP.  Again low suspicion for PE at this time.  No evidence of septic joint.  No significant evidence of cellulitis.  No fevers or chills.  Patient was educated on gout, and avoiding foods that would cause flares.  Return precautions discussed.  Patient voiced understanding and is agreeable.  Stable for discharge  Case discussed with Dr. Rubin PayorPickering who is agreeable to the plan and disposition. Final Clinical Impression(s) / ED Diagnoses Final diagnoses:  Acute gout of right ankle, unspecified cause    Rx / DC Orders ED Discharge Orders         Ordered    indomethacin (INDOCIN) 50 MG capsule  2 times daily with meals,   Status:  Discontinued        06/06/20 1455    indomethacin (  INDOCIN) 50 MG capsule  3 times daily with meals        06/06/20 1500           Leone Brand 06/06/20 1504    Benjiman Core, MD 06/06/20 1606

## 2020-06-06 NOTE — Discharge Instructions (Addendum)
Suspect that you may be having a gout flare.  Please take the indomethacin which is a anti-inflammatory as directed until finished.  Do not combine with medication such as ibuprofen, meloxicam, or any other anti-inflammatories.  There is a medication that you can take after your flareup improves to help prevent this in the future.  Please also read the handout about gout and avoid foods that trigger gout including alcohol and fatty foods.  Please make sure to follow-up with your primary care doctor.  Return to the ER for any new or worsening symptoms.

## 2020-06-06 NOTE — ED Triage Notes (Signed)
Pt report right foot pain/swelling Pt report h/o gout.

## 2020-06-06 NOTE — Progress Notes (Signed)
Lower extremity venous has been completed.   Preliminary results in CV Proc.   Blanch Media 06/06/2020 2:06 PM

## 2020-06-07 ENCOUNTER — Other Ambulatory Visit: Payer: Self-pay

## 2020-11-09 ENCOUNTER — Other Ambulatory Visit (HOSPITAL_COMMUNITY): Payer: Self-pay

## 2021-01-09 ENCOUNTER — Encounter (HOSPITAL_COMMUNITY): Payer: Self-pay

## 2021-01-09 ENCOUNTER — Ambulatory Visit (INDEPENDENT_AMBULATORY_CARE_PROVIDER_SITE_OTHER): Payer: Self-pay

## 2021-01-09 ENCOUNTER — Ambulatory Visit (HOSPITAL_COMMUNITY)
Admission: EM | Admit: 2021-01-09 | Discharge: 2021-01-09 | Disposition: A | Discharge status: Home / Self Care | Payer: Self-pay | Attending: Urgent Care | Admitting: Urgent Care

## 2021-01-09 ENCOUNTER — Other Ambulatory Visit: Payer: Self-pay

## 2021-01-09 DIAGNOSIS — M25511 Pain in right shoulder: Secondary | ICD-10-CM

## 2021-01-09 DIAGNOSIS — M545 Low back pain, unspecified: Secondary | ICD-10-CM

## 2021-01-09 DIAGNOSIS — S39012A Strain of muscle, fascia and tendon of lower back, initial encounter: Secondary | ICD-10-CM

## 2021-01-09 MED ORDER — TIZANIDINE HCL 4 MG PO TABS: 4.0000 mg | ORAL_TABLET | Freq: Every day | ORAL | 0 refills | Status: DC

## 2021-01-09 MED ORDER — ACETAMINOPHEN 325 MG PO TABS: 650.0000 mg | ORAL_TABLET | Freq: Four times a day (QID) | ORAL | 0 refills | Status: DC | PRN
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
## 2021-03-16 ENCOUNTER — Encounter (HOSPITAL_COMMUNITY): Payer: Self-pay

## 2021-03-16 ENCOUNTER — Emergency Department (HOSPITAL_COMMUNITY)
Admission: EM | Admit: 2021-03-16 | Discharge: 2021-03-16 | Disposition: A | Discharge status: Home / Self Care | Payer: Self-pay | Attending: Emergency Medicine | Admitting: Emergency Medicine

## 2021-03-16 ENCOUNTER — Other Ambulatory Visit: Payer: Self-pay

## 2021-03-16 DIAGNOSIS — Z760 Encounter for issue of repeat prescription: Secondary | ICD-10-CM | POA: Insufficient documentation

## 2021-03-16 DIAGNOSIS — I10 Essential (primary) hypertension: Secondary | ICD-10-CM | POA: Insufficient documentation

## 2021-03-16 DIAGNOSIS — Z79899 Other long term (current) drug therapy: Secondary | ICD-10-CM | POA: Insufficient documentation

## 2021-03-16 MED ORDER — BENAZEPRIL HCL 20 MG PO TABS: ORAL_TABLET | ORAL | 2 refills | Status: DC

## 2021-03-16 MED ORDER — ISOSORB DINITRATE-HYDRALAZINE 20-37.5 MG PO TABS: 2.0000 | ORAL_TABLET | Freq: Two times a day (BID) | ORAL | 2 refills | Status: DC

## 2021-03-16 MED ORDER — AMLODIPINE BESYLATE 10 MG PO TABS: ORAL_TABLET | Freq: Every day | ORAL | 2 refills | Status: DC

## 2021-03-16 NOTE — ED Notes (Signed)
Has been stretching out medications to make them last. Reports last dose of BP meds last week. No HA, No CP, No SOB reported.

## 2021-03-16 NOTE — ED Triage Notes (Signed)
Pt arrived via POV for medication refill for his bp. Amlodipine 10mg , benazipril 20mg , isosorbide-hydralazine 20/37.5mg . Pt states he doesn't have a PCP.

## 2021-03-16 NOTE — ED Provider Notes (Signed)
[facility] EMERGENCY DEPARTMENT Provider Note   CSN: [id number] Arrival date & time: 03/16/21  1655     History { Chief Complaint  Patient presents with   medication refill    James [name] is a 61 y.o. male who presents emergency department with request for medication refill.  Patient has a history of hypertension and pulmonary edema.  He does not have his medications and does not have a primary care doctor at this time.  He has no chest pain, shortness of breath or peripheral edema.  HPI     Home Medications Prior to Admission medications   Medication Sig Start Date End Date Taking? Authorizing Provider  acetaminophen (TYLENOL) 325 MG tablet Take 2 tablets (650 mg total) by mouth every 6 (six) hours as needed. 01/09/21   Wallis Bamberg, PA-C  amLODipine (NORVASC) 10 MG tablet TAKE 1 TABLET (10 MG TOTAL) BY MOUTH DAILY. 04/03/20 04/03/21  Marlon Pel, PA-C  amLODipine (NORVASC) 10 MG tablet TAKE 1 TABLET (10 MG TOTAL) BY MOUTH DAILY. 08/31/19 08/30/20  Patwardhan, Anabel Bene, MD  atorvastatin (LIPITOR) 20 MG tablet Take 1 tablet (20 mg total) by mouth daily. 08/31/19   Patwardhan, Anabel Bene, MD  atorvastatin (LIPITOR) 20 MG tablet TAKE 1 TABLET (20 MG TOTAL) BY MOUTH DAILY FOR 30 DAYS. 04/03/20 04/03/21  Marlon Pel, PA-C  benazepril (LOTENSIN) 20 MG tablet TAKE 1 TABLET (20 MG TOTAL) BY MOUTH DAILY FOR 30 DAYS. 04/03/20 04/03/21  Marlon Pel, PA-C  benazepril (LOTENSIN) 20 MG tablet TAKE 0.5 TABLETS (10 MG TOTAL) BY MOUTH DAILY. 08/31/19 08/30/20  Patwardhan, Anabel Bene, MD  chlorthalidone (HYGROTON) 25 MG tablet Take 1 tablet (25 mg total) by mouth daily. 02/16/20   Cantwell, Celeste C, PA-C  Colchicine 0.6 MG CAPS TAKE 1 CAPSULE (0.6 MG TOTAL) BY MOUTH 2 TIMES DAILY FOR 10 DAYS. 12/15/19 12/14/20  Tonia Ghent, PA-C  Cyanocobalamin (VITAMIN B-12 PO) Take 1 tablet by mouth daily.    [provider]  furosemide (LASIX) 40 MG tablet TAKE 1 TABLET (40 MG  TOTAL) BY MOUTH DAILY AS NEEDED (SWELLING). 04/17/20 04/17/21  Leslye Peer, MD  HYDROcodone-acetaminophen (NORCO) 7.5-325 MG tablet Take 1 tablet by mouth every 6 (six) hours as needed for moderate pain. 10/24/18   Eustace Moore, MD  isosorbide-hydrALAZINE (BIDIL) 20-37.5 MG tablet Take 2 tablets by mouth 2 (two) times daily. 08/31/19   Patwardhan, Anabel Bene, MD  meloxicam (MOBIC) 15 MG tablet Take 1 tablet (15 mg total) by mouth daily. 10/24/18   Eustace Moore, MD  predniSONE (DELTASONE) 10 MG tablet TAKE 6 TABS BY MOUTH ON DAY 1; 5 TABS ON DAY 2; 4 TABS ON DAY 3; 3 TABS ON DAY 4; 2 TABS ON DAY 5; 1 TAB ON DAY 6 THEN STOP 04/03/20 04/03/21  Marlon Pel, PA-C  tiZANidine (ZANAFLEX) 4 MG tablet Take 1 tablet (4 mg total) by mouth at bedtime. 01/09/21   Wallis Bamberg, PA-C      Allergies    Patient has no known allergies.    Review of Systems   Review of Systems  Physical Exam Updated Vital Signs BP (!) 188/101    Pulse 64    Temp 98.5 F (36.9 C) (Oral)    Resp 18    Ht 5\' 11"  (1.803 m)    Wt 120.7 kg    SpO2 100%    BMI 37.11 kg/m  Physical Exam Physical Exam  Nursing note and vitals reviewed. Constitutional:  He appears well-developed and well-nourished. No distress.  HENT:  Head: Normocephalic and atraumatic.  Eyes: Conjunctivae normal are normal. No scleral icterus.  Neck: Normal range of motion. Neck supple.  Cardiovascular: Normal rate, regular rhythm and normal heart sounds.   Pulmonary/Chest: Effort normal and breath sounds normal. No respiratory distress.  Abdominal: Soft. There is no tenderness.  Musculoskeletal: He exhibits no edema.  Neurological: He is alert.  Skin: Skin is warm and dry. He is not diaphoretic.  Psychiatric: His behavior is normal.   ED Results / Procedures / Treatments   Labs (all labs ordered are listed, but only abnormal results are displayed) Labs Reviewed - No data to display  EKG None  Radiology No results  found.  Procedures Procedures    Medications Ordered in ED Medications - No data to display  ED Course/ Medical Decision Making/ A&P                           Medical Decision Making  Patient here for medication refill.  Notably hypertensive.  Medications refilled.  He is advised to follow-up with primary care.  He appears otherwise appropriate for discharge at this time Final Clinical Impression(s) / ED Diagnoses Final diagnoses:  None    Rx / DC Orders ED Discharge Orders     None         Arthor Captain, PA-C 03/16/21 1827    Alvira Monday, MD 03/17/21 [phone]

## 2021-03-16 NOTE — ED Notes (Signed)
Pt verbalized understanding of d/c instructions, meds, and followup care. Denies questions. VSS, no distress noted. Steady gait to exit with all belongings.  ?

## 2021-04-18 ENCOUNTER — Telehealth: Payer: Self-pay | Admitting: *Deleted

## 2021-04-18 ENCOUNTER — Other Ambulatory Visit: Payer: Self-pay

## 2021-04-18 ENCOUNTER — Emergency Department (HOSPITAL_COMMUNITY): Payer: Self-pay

## 2021-04-18 ENCOUNTER — Telehealth: Payer: Self-pay | Admitting: Surgery

## 2021-04-18 ENCOUNTER — Emergency Department (HOSPITAL_COMMUNITY)
Admission: EM | Admit: 2021-04-18 | Discharge: 2021-04-18 | Disposition: A | Discharge status: Home / Self Care | Payer: Self-pay | Attending: Emergency Medicine | Admitting: Emergency Medicine

## 2021-04-18 ENCOUNTER — Encounter (HOSPITAL_COMMUNITY): Payer: Self-pay | Admitting: Emergency Medicine

## 2021-04-18 DIAGNOSIS — Z20822 Contact with and (suspected) exposure to covid-19: Secondary | ICD-10-CM | POA: Insufficient documentation

## 2021-04-18 DIAGNOSIS — I10 Essential (primary) hypertension: Secondary | ICD-10-CM | POA: Insufficient documentation

## 2021-04-18 DIAGNOSIS — Z79899 Other long term (current) drug therapy: Secondary | ICD-10-CM | POA: Insufficient documentation

## 2021-04-18 DIAGNOSIS — J069 Acute upper respiratory infection, unspecified: Secondary | ICD-10-CM | POA: Insufficient documentation

## 2021-04-18 LAB — RESP PANEL BY RT-PCR (FLU A&B, COVID) ARPGX2
Influenza A by PCR: POSITIVE — AB
Influenza B by PCR: NEGATIVE
SARS Coronavirus 2 by RT PCR: NEGATIVE

## 2021-04-18 MED ORDER — DOXYCYCLINE HYCLATE 100 MG PO TABS
100.0000 mg | ORAL_TABLET | Freq: Two times a day (BID) | ORAL | 0 refills | Status: DC
  Filled 2021-04-18: qty 20, 10d supply, fill #0

## 2021-04-18 MED ORDER — BENZONATATE 100 MG PO CAPS
100.0000 mg | ORAL_CAPSULE | Freq: Three times a day (TID) | ORAL | 0 refills | Status: DC
  Filled 2021-04-18: qty 21, 7d supply, fill #0

## 2021-04-18 NOTE — ED Provider Notes (Signed)
[facility] EMERGENCY DEPARTMENT Provider Note   CSN: [id number] Arrival date & time: 04/18/21  1325     History  Chief Complaint  Patient presents with   Cough    James [name] is a 61 y.o. male. The patient presents to the emergency department complaining of cough, fever, and some diarrhea that have been ongoing since Monday. The patient states that he has taken some robitussen at home with little relief. The patient is not a smoker. PMH significant for HTN, high cholesterol, and atrial fibrillation  HPI     Home Medications Prior to Admission medications   Medication Sig Start Date End Date Taking? Authorizing Provider  benzonatate (TESSALON) 100 MG capsule Take 1 capsule (100 mg total) by mouth every 8 (eight) hours. 04/18/21  Yes Barrie Dunker B, PA  doxycycline (VIBRAMYCIN) 100 MG capsule Take 1 capsule (100 mg total) by mouth 2 (two) times daily. 04/18/21  Yes Barrie Dunker B, PA  acetaminophen (TYLENOL) 325 MG tablet Take 2 tablets (650 mg total) by mouth every 6 (six) hours as needed. 01/09/21   Wallis Bamberg, PA-C  amLODipine (NORVASC) 10 MG tablet TAKE 1 TABLET (10 MG TOTAL) BY MOUTH DAILY. 04/03/20 04/03/21  Marlon Pel, PA-C  amLODipine (NORVASC) 10 MG tablet TAKE 1 TABLET (10 MG TOTAL) BY MOUTH DAILY. 03/16/21 03/16/22  Arthor Captain, PA-C  atorvastatin (LIPITOR) 20 MG tablet Take 1 tablet (20 mg total) by mouth daily. 08/31/19   Patwardhan, Anabel Bene, MD  atorvastatin (LIPITOR) 20 MG tablet TAKE 1 TABLET (20 MG TOTAL) BY MOUTH DAILY FOR 30 DAYS. 04/03/20 04/03/21  Marlon Pel, PA-C  benazepril (LOTENSIN) 20 MG tablet TAKE 0.5 TABLETS (10 MG TOTAL) BY MOUTH DAILY. 08/31/19 08/30/20  Patwardhan, Anabel Bene, MD  benazepril (LOTENSIN) 20 MG tablet TAKE 1 TABLET (20 MG TOTAL) BY MOUTH DAILY FOR 30 DAYS. 03/16/21 03/16/22  Arthor Captain, PA-C  chlorthalidone (HYGROTON) 25 MG tablet Take 1 tablet (25 mg total) by mouth daily. 02/16/20   Cantwell, Celeste C,  PA-C  Colchicine 0.6 MG CAPS TAKE 1 CAPSULE (0.6 MG TOTAL) BY MOUTH 2 TIMES DAILY FOR 10 DAYS. 12/15/19 12/14/20  Tonia Ghent, PA-C  Cyanocobalamin (VITAMIN B-12 PO) Take 1 tablet by mouth daily.    [provider]  furosemide (LASIX) 40 MG tablet TAKE 1 TABLET (40 MG TOTAL) BY MOUTH DAILY AS NEEDED (SWELLING). 04/17/20 04/17/21  Leslye Peer, MD  HYDROcodone-acetaminophen (NORCO) 7.5-325 MG tablet Take 1 tablet by mouth every 6 (six) hours as needed for moderate pain. 10/24/18   Eustace Moore, MD  isosorbide-hydrALAZINE (BIDIL) 20-37.5 MG tablet Take 2 tablets by mouth 2 (two) times daily. 08/31/19   Patwardhan, Manish J, MD  isosorbide-hydrALAZINE (BIDIL) 20-37.5 MG tablet Take 2 tablets by mouth 2 (two) times daily. 03/16/21   Arthor Captain, PA-C  meloxicam (MOBIC) 15 MG tablet Take 1 tablet (15 mg total) by mouth daily. 10/24/18   Eustace Moore, MD  tiZANidine (ZANAFLEX) 4 MG tablet Take 1 tablet (4 mg total) by mouth at bedtime. 01/09/21   Wallis Bamberg, PA-C      Allergies    Patient has no known allergies.    Review of Systems   Review of Systems  Constitutional:  Positive for fever.  HENT:  Positive for congestion.   Respiratory:  Positive for cough. Negative for shortness of breath.   Cardiovascular:  Negative for chest pain.  Gastrointestinal:  Positive for diarrhea.   Physical Exam Updated  Vital Signs BP (!) 146/86    Pulse (!) 52    Temp 98.4 F (36.9 C) (Oral)    Resp 18    Ht 5\' 11"  (1.803 m)    Wt 120.2 kg    SpO2 97%    BMI 36.96 kg/m  Physical Exam Constitutional:      General: He is not in acute distress. HENT:     Head: Normocephalic.  Eyes:     Conjunctiva/sclera: Conjunctivae normal.  Cardiovascular:     Rate and Rhythm: Normal rate and regular rhythm.     Heart sounds: Normal heart sounds.  Pulmonary:     Effort: Pulmonary effort is normal.     Breath sounds: Rhonchi present.  Abdominal:     Palpations: Abdomen is soft.      Tenderness: There is no abdominal tenderness.  Musculoskeletal:     Cervical back: Normal range of motion.  Neurological:     Mental Status: He is alert.    ED Results / Procedures / Treatments   Labs (all labs ordered are listed, but only abnormal results are displayed) Labs Reviewed  RESP PANEL BY RT-PCR (FLU A&B, COVID) [id number]    EKG None  Radiology DG Chest 2 View  Result Date: 04/18/2021 CLINICAL DATA:  Cough, congestion, fever for 4 days. Evaluate for pneumonia. EXAM: CHEST - 2 VIEW COMPARISON:  Chest radiograph 02/14/2020 FINDINGS: The heart size and mediastinal contours are within normal limits. Subtle patchy airspace opacity overlying the right lower lobe. Left lung is clear. No pleural effusion or pneumothorax. Visualized skeletal structures are unremarkable. IMPRESSION: Subtle patchy airspace opacity in the right lower lobe could represent atelectasis or infection in the appropriate clinical context. Electronically Signed   By: 02/16/2020 M.D.   On: 04/18/2021 14:14    Procedures Procedures    Medications Ordered in ED Medications - No data to display  ED Course/ Medical Decision Making/ A&P                           Medical Decision Making Risk Prescription drug management.   The patient was evaluated today for cough, fever, and diarrhea. Differential includes but is not limited to viral infection, bacterial infection, pneumonia, and others. He was tested for Covid-19 and influenza which are pending. There was a small possible pneumonia in the left basilar airspace. The patient is currently afebrile.  I see no reason for hospitalization at this time. I recommend discharge home. The patient may check MyChart for test results. I have prescribed doxycycline for the possible pneumonia and recommend PCP follow up. I also prescribed Tessalon pearls for the patient's cough. Return precautions were provided   Final Clinical Impression(s) / ED Diagnoses Final diagnoses:   Upper respiratory tract infection, unspecified type    Rx / DC Orders ED Discharge Orders          Ordered    doxycycline (VIBRAMYCIN) 100 MG capsule  2 times daily        04/18/21 1457    benzonatate (TESSALON) 100 MG capsule  Every 8 hours        04/18/21 1457              04/20/21, Darrick Grinder 04/18/21 1517    04/20/21, MD 04/18/21 1524

## 2021-04-18 NOTE — Discharge Instructions (Addendum)
You were seen today for cough, fever, and diarrhea. You have been tested for Covid-19 and influenza. Please check MyChart for your results. I have prescribed an antibiotic due to a small potential lung infection. I have also prescribed a cough medication. You may contact your primary care provider if you test positive for Covid to discuss possible antiviral medication. I recommend follow up chest x-ray in 6 weeks at your primary care provider. Return to the emergency department if you develop life threatening conditions such as chest pain, shortness of breath, or altered level of consciousness

## 2021-04-18 NOTE — ED Notes (Signed)
Pt alert, NAD, calm, interactive, resps e/u, speaking in clear complete sentences. EDPA at BS.  

## 2021-04-18 NOTE — ED Provider Triage Note (Signed)
Emergency Medicine Provider Triage Evaluation Note  James [name] , a 61 y.o. male  was evaluated in triage.  Pt complains of cough and fever since Saturday. Cough has been productive. Sputum is yellow/white. He has filled half of a soda bottle full of sputum. He has associated difficulty breathing at times. No chest pain, n/v, abdominal pain.  Review of Systems  Positive: Cough, fever, chills, dyspnea Negative:   Physical Exam  BP (!) 146/86    Pulse (!) 52    Temp 98.4 F (36.9 C) (Oral)    Resp 18    Ht 5\' 11"  (1.803 m)    Wt 120.2 kg    SpO2 97%    BMI 36.96 kg/m  Gen:   Awake, no distress   Resp:  Normal effort  MSK:   Moves extremities without difficulty  Other:  Lungs course > left side  Medical Decision Making  Medically screening exam initiated at 1:50 PM.  Appropriate orders placed.  James [name] was informed that the remainder of the evaluation will be completed by another provider, this initial triage assessment does not replace that evaluation, and the importance of remaining in the ED until their evaluation is complete.  Chest xr added on   James [name] 04/18/21 1352

## 2021-04-18 NOTE — ED Triage Notes (Signed)
Patient c/o cough, diarrhea, headache and loss of taste x 4 days. Reports difficulty sleeping at night d/t cough.

## 2021-04-18 NOTE — Telephone Encounter (Signed)
Patient called concerning prescription cost. Patient sent a goodrx card for discounted prescription. Patient updated patient .

## 2021-04-19 ENCOUNTER — Other Ambulatory Visit: Payer: Self-pay

## 2021-04-19 NOTE — Telephone Encounter (Signed)
Pt called regarding not being able to afford Rx.  RNCM found savings coupon and emailed it to pt for use.

## 2021-11-29 ENCOUNTER — Encounter (HOSPITAL_COMMUNITY): Payer: Self-pay

## 2021-11-29 ENCOUNTER — Telehealth (HOSPITAL_COMMUNITY): Payer: Self-pay

## 2021-11-29 ENCOUNTER — Ambulatory Visit (HOSPITAL_COMMUNITY)
Admission: EM | Admit: 2021-11-29 | Discharge: 2021-11-29 | Disposition: A | Discharge status: Home / Self Care | Payer: Self-pay | Attending: Physician Assistant | Admitting: Physician Assistant

## 2021-11-29 DIAGNOSIS — M7989 Other specified soft tissue disorders: Secondary | ICD-10-CM

## 2021-11-29 MED ORDER — FUROSEMIDE 20 MG PO TABS
20.0000 mg | ORAL_TABLET | Freq: Every day | ORAL | 0 refills | Status: DC
  Filled 2021-11-29: qty 30, 30d supply, fill #0

## 2021-11-29 MED ORDER — PREDNISONE 20 MG PO TABS: 40.0000 mg | ORAL_TABLET | Freq: Every day | ORAL | 0 refills | Status: AC

## 2021-11-29 MED ORDER — FUROSEMIDE 20 MG PO TABS: 20.0000 mg | ORAL_TABLET | Freq: Every day | ORAL | 0 refills | Status: DC

## 2021-11-29 MED ORDER — PREDNISONE 20 MG PO TABS
40.0000 mg | ORAL_TABLET | Freq: Every day | ORAL | 0 refills | Status: DC
  Filled 2021-11-29: qty 10, 5d supply, fill #0

## 2021-11-29 NOTE — ED Provider Notes (Signed)
[facility]    CSN: [id number] Arrival date & time: 11/29/21  1759      History   Chief Complaint Chief Complaint  Patient presents with   Leg Pain    HPI James [name] is a 61 y.o. male.   Patient here today for evaluation of left leg pain and swelling he has had for the last week. He reports that pain was more significant several days ago and has improved. He does have history of edema and states this is his typical presentation. He notes symptoms started after he was driving a manual vehicle he is not used to driving for about 12 hours. He notes during the trip he did take breaks from driving and walking around. Pain started after he returned home. More swelling and pain is located in foot than leg and has seemed to migrate to different areas over time. Patient is requesting refill of furosemide he was previously prescribed for edema as this has been helpful in the past. He does not report any shortness of breath. He has taken ibuprofen which was not significantly helpful. He states his blood pressure is elevated because he has not been taking his medications as prescribed. He does have his medications and plans to restart meds tomorrow. He plans follow up with PCP in a few weeks when insurance becomes effective.   The history is provided by the patient.    Past Medical History:  Diagnosis Date   A-fib [facility])    Hypercholesteremia    Hypertension     Patient Active Problem List   Diagnosis Date Noted   Snoring 12/10/2018   Hypertensive emergency 02/12/2018   Acute pulmonary edema (HCC) 02/12/2018   Unspecified atrial fibrillation (HCC) 02/12/2018   Elevated troponin 02/12/2018   Hypertensive urgency 02/12/2018   Acute respiratory failure with hypoxia (HCC) 02/11/2018   Essential hypertension 10/04/2014    Past Surgical History:  Procedure Laterality Date   LEFT HEART CATH AND CORONARY ANGIOGRAPHY N/A 02/12/2018   Procedure: LEFT HEART CATH AND  CORONARY ANGIOGRAPHY;  Surgeon: Elder Negus, MD;  Location: [facility];  Service: Cardiovascular;  Laterality: N/A;       Home Medications    Prior to Admission medications   Medication Sig Start Date End Date Taking? Authorizing Provider  acetaminophen (TYLENOL) 325 MG tablet Take 2 tablets (650 mg total) by mouth every 6 (six) hours as needed. 01/09/21   Wallis Bamberg, PA-C  amLODipine (NORVASC) 10 MG tablet TAKE 1 TABLET (10 MG TOTAL) BY MOUTH DAILY. 03/16/21 03/16/22  Arthor Captain, PA-C  atorvastatin (LIPITOR) 20 MG tablet Take 1 tablet (20 mg total) by mouth daily. 08/31/19   Patwardhan, Anabel Bene, MD  atorvastatin (LIPITOR) 20 MG tablet TAKE 1 TABLET (20 MG TOTAL) BY MOUTH DAILY FOR 30 DAYS. 04/03/20 04/03/21  Marlon Pel, PA-C  benazepril (LOTENSIN) 20 MG tablet TAKE 0.5 TABLETS (10 MG TOTAL) BY MOUTH DAILY. 08/31/19 08/30/20  Patwardhan, Anabel Bene, MD  benazepril (LOTENSIN) 20 MG tablet TAKE 1 TABLET (20 MG TOTAL) BY MOUTH DAILY FOR 30 DAYS. 03/16/21 03/16/22  Arthor Captain, PA-C  benzonatate (TESSALON) 100 MG capsule Take 1 capsule (100 mg total) by mouth every 8 (eight) hours. 04/18/21   Darrick Grinder, PA-C  Colchicine 0.6 MG CAPS TAKE 1 CAPSULE (0.6 MG TOTAL) BY MOUTH 2 TIMES DAILY FOR 10 DAYS. 12/15/19 12/14/20  Tonia Ghent, PA-C  Cyanocobalamin (VITAMIN B-12 PO) Take 1 tablet by mouth daily.  [provider]  doxycycline (VIBRA-TABS) 100 MG tablet Take 1 tablet (100 mg total) by mouth 2 (two) times daily. 04/18/21   Darrick Grinder, PA-C  furosemide (LASIX) 20 MG tablet Take 1 tablet (20 mg total) by mouth daily. 11/29/21   Tomi Bamberger, PA-C  HYDROcodone-acetaminophen (NORCO) 7.5-325 MG tablet Take 1 tablet by mouth every 6 (six) hours as needed for moderate pain. 10/24/18   Eustace Moore, MD  isosorbide-hydrALAZINE (BIDIL) 20-37.5 MG tablet Take 2 tablets by mouth 2 (two) times daily. 08/31/19   Patwardhan, Manish J, MD   isosorbide-hydrALAZINE (BIDIL) 20-37.5 MG tablet Take 2 tablets by mouth 2 (two) times daily. 03/16/21   Arthor Captain, PA-C  meloxicam (MOBIC) 15 MG tablet Take 1 tablet (15 mg total) by mouth daily. 10/24/18   Eustace Moore, MD  predniSONE (DELTASONE) 20 MG tablet Take 2 tablets (40 mg total) by mouth daily with breakfast for 5 days. 11/29/21 12/04/21  Tomi Bamberger, PA-C  tiZANidine (ZANAFLEX) 4 MG tablet Take 1 tablet (4 mg total) by mouth at bedtime. 01/09/21   Wallis Bamberg, PA-C    Family History Family History  Problem Relation Age of Onset   Diabetes Mother    CAD Brother     Social History Social History   Tobacco Use   Smoking status: Never   Smokeless tobacco: Never  Substance Use Topics   Alcohol use: Not Currently    Alcohol/week: 5.0 standard drinks of alcohol    Types: 5 Shots of liquor per week   Drug use: No     Allergies   Patient has no known allergies.   Review of Systems Review of Systems  Constitutional:  Negative for chills and fever.  Eyes:  Negative for discharge and redness.  Respiratory:  Negative for shortness of breath.   Cardiovascular:  Positive for leg swelling.  Skin:  Negative for color change and wound.  Neurological:  Negative for numbness.     Physical Exam Triage Vital Signs ED Triage Vitals  Enc Vitals Group     BP      Pulse      Resp      Temp      Temp src      SpO2      Weight      Height      Head Circumference      Peak Flow      Pain Score      Pain Loc      Pain Edu?      Excl. in GC?    No data found.  Updated Vital Signs BP (!) 161/103 (BP Location: Right Arm)   Pulse 79   Temp 98.6 F (37 C) (Oral)   Resp 12   SpO2 100%       Physical Exam Vitals and nursing note reviewed.  Constitutional:      General: He is not in acute distress.    Appearance: Normal appearance. He is not ill-appearing.  HENT:     Head: Normocephalic and atraumatic.  Eyes:     Conjunctiva/sclera: Conjunctivae  normal.  Cardiovascular:     Rate and Rhythm: Normal rate.  Pulmonary:     Effort: Pulmonary effort is normal.  Musculoskeletal:     Comments: Mild swelling to left ankle and foot, no erythema, no TTP to left calf  Neurological:     Mental Status: He is alert.  Psychiatric:  Mood and Affect: Mood normal.        Behavior: Behavior normal.        Thought Content: Thought content normal.      UC Treatments / Results  Labs (all labs ordered are listed, but only abnormal results are displayed) Labs Reviewed - No data to display  EKG   Radiology No results found.  Procedures Procedures (including critical care time)  Medications Ordered in UC Medications - No data to display  Initial Impression / Assessment and Plan / UC Course  I have reviewed the triage vital signs and the nursing notes.  Pertinent labs & imaging results that were available during my care of the patient were reviewed by me and considered in my medical decision making (see chart for details).    I suspect more likely inflammatory process of pain given different activity as preceding incident. Will trial steroid burst. I did refill furosemide at half dose he was previously taking. Given significant past medical history discussed possibility of DVT and recommended follow up in the ED if symptoms fail to improve or worsen in any way.   Final Clinical Impressions(s) / UC Diagnoses   Final diagnoses:  Left leg swelling     Discharge Instructions       Please follow up with PCP as soon as possible.  If no improvement of pain or you have any worsening please report to ED immediately.      ED Prescriptions     Medication Sig Dispense Auth. Provider   furosemide (LASIX) 20 MG tablet Take 1 tablet (20 mg total) by mouth daily. 30 tablet Ewell Poe F, PA-C   predniSONE (DELTASONE) 20 MG tablet Take 2 tablets (40 mg total) by mouth daily with breakfast for 5 days. 10 tablet Francene Finders,  PA-C      PDMP not reviewed this encounter.   Francene Finders, PA-C 11/29/21 1950

## 2021-11-29 NOTE — Discharge Instructions (Signed)
  Please follow up with PCP as soon as possible.  If no improvement of pain or you have any worsening please report to ED immediately.

## 2021-11-29 NOTE — ED Triage Notes (Signed)
Pt is here for left leg pain and swelling x7days

## 2021-12-02 ENCOUNTER — Other Ambulatory Visit: Payer: Self-pay

## 2021-12-16 ENCOUNTER — Other Ambulatory Visit: Payer: Self-pay

## 2021-12-16 ENCOUNTER — Ambulatory Visit (HOSPITAL_COMMUNITY)
Admission: EM | Admit: 2021-12-16 | Discharge: 2021-12-16 | Disposition: A | Discharge status: Home / Self Care | Payer: Self-pay | Attending: Nurse Practitioner | Admitting: Nurse Practitioner

## 2021-12-16 ENCOUNTER — Encounter (HOSPITAL_COMMUNITY): Payer: Self-pay | Admitting: *Deleted

## 2021-12-16 ENCOUNTER — Ambulatory Visit (INDEPENDENT_AMBULATORY_CARE_PROVIDER_SITE_OTHER): Payer: Self-pay

## 2021-12-16 DIAGNOSIS — M25572 Pain in left ankle and joints of left foot: Secondary | ICD-10-CM

## 2021-12-16 DIAGNOSIS — M19072 Primary osteoarthritis, left ankle and foot: Secondary | ICD-10-CM

## 2021-12-16 MED ORDER — MELOXICAM 15 MG PO TABS: 15.0000 mg | ORAL_TABLET | Freq: Every day | ORAL | 0 refills | Status: AC

## 2021-12-16 MED ORDER — METHYLPREDNISOLONE 4 MG PO TBPK: ORAL_TABLET | ORAL | 0 refills | Status: DC

## 2021-12-16 NOTE — Discharge Instructions (Addendum)
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 Osteoarthritis is the most common form of arthritis. It often occurs in older people. It is a condition that gets worse over time.   There is no cure for this condition, but treatment can help control pain and improve joint function.  Take medications as prescribed.  Follow-up with orthopedics if no improvement noted in symptoms after medications.

## 2021-12-16 NOTE — ED Provider Notes (Signed)
[place]    CSN: [id number] Arrival date & time: 12/16/21  1423      History   Chief Complaint Chief Complaint  Patient presents with   Ankle Pain    HPI James [name] is a 61 y.o. male.   Subjective:  James [name] is a 61 y.o. male who presents with left ankle pain. Onset of the symptoms was several weeks ago. No known inciting event. Current symptoms include the ability to bear weight but with pain, pain at the lateral aspect of the ankle, swelling, and worsening symptoms after a period of activity. Aggravating factors include standing, walking, and weight bearing. Symptoms have waxed and waned since onset. Patient has had prior ankle problems a couple of years ago but not much since. He was seen for the same on 11/29/21 and prescribed a short course of prednisone with minimal relief in symptoms.  The following portions of the patient's history were reviewed and updated as appropriate: allergies, current medications, past family history, past medical history, past social history, past surgical history, and problem list.         Past Medical History:  Diagnosis Date   A-fib [facility])    Hypercholesteremia    Hypertension     Patient Active Problem List   Diagnosis Date Noted   Snoring 12/10/2018   Hypertensive emergency 02/12/2018   Acute pulmonary edema ([place]) 02/12/2018   Unspecified atrial fibrillation ([place]) 02/12/2018   Elevated troponin 02/12/2018   Hypertensive urgency 02/12/2018   Acute respiratory failure with hypoxia ([place]) 02/11/2018   Essential hypertension 10/04/2014    Past Surgical History:  Procedure Laterality Date   LEFT HEART CATH AND CORONARY ANGIOGRAPHY N/A 02/12/2018   Procedure: LEFT HEART CATH AND CORONARY ANGIOGRAPHY;  Surgeon: Nigel Mormon, MD;  Location: [facility];  Service: Cardiovascular;  Laterality: N/A;       Home Medications    Prior to Admission medications   Medication Sig Start  Date End Date Taking? Authorizing Provider  meloxicam (MOBIC) 15 MG tablet Take 1 tablet (15 mg total) by mouth daily with breakfast. 12/16/21 01/15/22 Yes Astra Gregg, Aldona Bar, FNP  methylPREDNISolone (MEDROL DOSEPAK) 4 MG TBPK tablet Take as directed 12/16/21  Yes Enrique Sack, FNP  acetaminophen (TYLENOL) 325 MG tablet Take 2 tablets (650 mg total) by mouth every 6 (six) hours as needed. 01/09/21   Jaynee Eagles, PA-C  amLODipine (NORVASC) 10 MG tablet TAKE 1 TABLET (10 MG TOTAL) BY MOUTH DAILY. 03/16/21 03/16/22  Margarita Mail, PA-C  atorvastatin (LIPITOR) 20 MG tablet Take 1 tablet (20 mg total) by mouth daily. 08/31/19   Patwardhan, Reynold Bowen, MD  atorvastatin (LIPITOR) 20 MG tablet TAKE 1 TABLET (20 MG TOTAL) BY MOUTH DAILY FOR 30 DAYS. 04/03/20 04/03/21  Delos Haring, PA-C  benazepril (LOTENSIN) 20 MG tablet TAKE 0.5 TABLETS (10 MG TOTAL) BY MOUTH DAILY. 08/31/19 08/30/20  Patwardhan, Reynold Bowen, MD  benazepril (LOTENSIN) 20 MG tablet TAKE 1 TABLET (20 MG TOTAL) BY MOUTH DAILY FOR 30 DAYS. 03/16/21 03/16/22  Margarita Mail, PA-C  benzonatate (TESSALON) 100 MG capsule Take 1 capsule (100 mg total) by mouth every 8 (eight) hours. 04/18/21   Dorothyann Peng, PA-C  Colchicine 0.6 MG CAPS TAKE 1 CAPSULE (0.6 MG TOTAL) BY MOUTH 2 TIMES DAILY FOR 10 DAYS. 12/15/19 12/14/20  Pecola Lawless, PA-C  Cyanocobalamin (VITAMIN B-12 PO) Take 1 tablet by mouth daily.    [provider]  doxycycline (VIBRA-TABS) 100 MG tablet  Take 1 tablet (100 mg total) by mouth 2 (two) times daily. 04/18/21   Dorothyann Peng, PA-C  furosemide (LASIX) 20 MG tablet Take 1 tablet (20 mg total) by mouth daily. 11/29/21   Francene Finders, PA-C  HYDROcodone-acetaminophen (NORCO) 7.5-325 MG tablet Take 1 tablet by mouth every 6 (six) hours as needed for moderate pain. 10/24/18   Raylene Everts, MD  isosorbide-hydrALAZINE (BIDIL) 20-37.5 MG tablet Take 2 tablets by mouth 2 (two) times daily. 08/31/19   Patwardhan, Manish J,  MD  isosorbide-hydrALAZINE (BIDIL) 20-37.5 MG tablet Take 2 tablets by mouth 2 (two) times daily. 03/16/21   Harris, Vernie Shanks, PA-C  tiZANidine (ZANAFLEX) 4 MG tablet Take 1 tablet (4 mg total) by mouth at bedtime. 01/09/21   Jaynee Eagles, PA-C    Family History Family History  Problem Relation Age of Onset   Diabetes Mother    CAD Brother     Social History Social History   Tobacco Use   Smoking status: Never   Smokeless tobacco: Never  Substance Use Topics   Alcohol use: Not Currently    Alcohol/week: 5.0 standard drinks of alcohol    Types: 5 Shots of liquor per week   Drug use: No     Allergies   Patient has no known allergies.   Review of Systems Review of Systems  Musculoskeletal:  Positive for arthralgias and joint swelling.  All other systems reviewed and are negative.    Physical Exam Triage Vital Signs ED Triage Vitals  Enc Vitals Group     BP 12/16/21 1625 (!) 164/82     Pulse Rate 12/16/21 1625 71     Resp 12/16/21 1625 18     Temp 12/16/21 1625 98.5 F (36.9 C)     Temp src --      SpO2 12/16/21 1625 100 %     Weight --      Height --      Head Circumference --      Peak Flow --      Pain Score 12/16/21 1622 9     Pain Loc --      Pain Edu? --      Excl. in [place]? --    No data found.  Updated Vital Signs BP (!) 164/82   Pulse 71   Temp 98.5 F (36.9 C)   Resp 18   SpO2 100%   Visual Acuity Right Eye Distance:   Left Eye Distance:   Bilateral Distance:    Right Eye Near:   Left Eye Near:    Bilateral Near:     Physical Exam Vitals reviewed.  Constitutional:      Appearance: Normal appearance.  HENT:     Head: Normocephalic.  Eyes:     Conjunctiva/sclera: Conjunctivae normal.  Cardiovascular:     Rate and Rhythm: Normal rate and regular rhythm.  Pulmonary:     Effort: Pulmonary effort is normal.     Breath sounds: Normal breath sounds.  Musculoskeletal:        General: Normal range of motion.     Cervical back: Normal  range of motion and neck supple.     Right ankle: Normal.     Left ankle: Swelling present. No deformity, ecchymosis or lacerations. Tenderness present over the lateral malleolus. Normal range of motion.     Left Achilles Tendon: Normal.     Left foot: No swelling.  Skin:    General: Skin is warm and dry.  Neurological:     General: No focal deficit present.     Mental Status: He is alert and oriented to person, place, and time.      UC Treatments / Results  Labs (all labs ordered are listed, but only abnormal results are displayed) Labs Reviewed - No data to display  EKG   Radiology DG Ankle Complete Left  Result Date: 12/16/2021 CLINICAL DATA:  Swelling and pain EXAM: LEFT ANKLE COMPLETE - 3+ VIEW COMPARISON:  None Available. FINDINGS: Frontal, oblique, and lateral views of the left ankle are obtained. No acute fracture, subluxation, or dislocation. Osteoarthritis of the midfoot, hindfoot, and tibiotalar joint. Prominent superior and inferior calcaneal spurs. Soft tissues are grossly unremarkable. IMPRESSION: 1. Osteoarthritis of the left ankle, hindfoot, and midfoot. 2. No acute bony abnormality. Electronically Signed   By: Randa Ngo M.D.   On: 12/16/2021 17:13    Procedures Procedures (including critical care time)  Medications Ordered in UC Medications - No data to display  Initial Impression / Assessment and Plan / UC Course  I have reviewed the triage vital signs and the nursing notes.  Pertinent labs & imaging results that were available during my care of the patient were reviewed by me and considered in my medical decision making (see chart for details).    61 yo male returns to the urgent care with continued complaints of left ankle pain.  Denies any injury.  He completed the medications prescribed on his prior visit with minimal relief in symptoms.  Imaging obtained today.  X-ray shows significant osteoarthritis within the ankle and foot.  Medrol Dosepak and  meloxicam ordered.  Advised patient not to take any over-the-counter NSAIDs in addition to the prescribed medications; however, he may continue taking Tylenol as needed.  Patient advised to follow-up with orthopedics if symptoms persist.   Today's evaluation has revealed no signs of a dangerous process. Discussed diagnosis with patient and/or guardian. Patient and/or guardian aware of their diagnosis, possible red flag symptoms to watch out for and need for close follow up. Patient and/or guardian understands verbal and written discharge instructions. Patient and/or guardian comfortable with plan and disposition.  Patient and/or guardian has a clear mental status at this time, good insight into illness (after discussion and teaching) and has clear judgment to make decisions regarding their care  Documentation was completed with the aid of voice recognition software. Transcription may contain typographical errors. Final Clinical Impressions(s) / UC Diagnoses   Final diagnoses:  Primary osteoarthritis of left ankle     Discharge Instructions      Your pain is due to osteoarthritis which is a type of arthritis. Osteoarthritis is the most common form of arthritis. It often occurs in older people. It is a condition that gets worse over time.   There is no cure for this condition, but treatment can help control pain and improve joint function.  Take medications as prescribed.  Follow-up with orthopedics if no improvement noted in symptoms after medications.       ED Prescriptions     Medication Sig Dispense Auth. Provider   meloxicam (MOBIC) 15 MG tablet Take 1 tablet (15 mg total) by mouth daily with breakfast. 30 tablet Enrique Sack, FNP   methylPREDNISolone (MEDROL DOSEPAK) 4 MG TBPK tablet Take as directed 21 tablet Enrique Sack, FNP      PDMP not reviewed this encounter.   Enrique Sack, [place] 12/16/21 [phone]

## 2021-12-16 NOTE — ED Triage Notes (Signed)
Pt reports he has had pain and swelling to Lt ankle and lt foot. Pt was seen 11-29-21 for same. Pt reports the swelling comes and goes.

## 2022-01-20 ENCOUNTER — Ambulatory Visit (HOSPITAL_COMMUNITY)
Admission: EM | Admit: 2022-01-20 | Discharge: 2022-01-20 | Disposition: A | Discharge status: Home / Self Care | Payer: No Typology Code available for payment source | Attending: Emergency Medicine | Admitting: Emergency Medicine

## 2022-01-20 ENCOUNTER — Encounter (HOSPITAL_COMMUNITY): Payer: Self-pay

## 2022-01-20 DIAGNOSIS — J069 Acute upper respiratory infection, unspecified: Secondary | ICD-10-CM

## 2022-01-20 MED ORDER — CETIRIZINE HCL 10 MG PO TABS: 10.0000 mg | ORAL_TABLET | Freq: Every day | ORAL | 3 refills | Status: DC

## 2022-01-20 MED ORDER — DM-GUAIFENESIN ER 30-600 MG PO TB12: 1.0000 | ORAL_TABLET | Freq: Two times a day (BID) | ORAL | 0 refills | Status: DC

## 2022-01-20 MED ORDER — DM-GUAIFENESIN ER 30-600 MG PO TB12: 1.0000 | ORAL_TABLET | Freq: Two times a day (BID) | ORAL | 0 refills | Status: AC

## 2022-01-20 NOTE — Discharge Instructions (Addendum)
I recommend taking the mucinex DM twice daily for the next 5 days Make sure you take with lots of water.  You can also take the daily allergy medicine (zyrtec) which can help with congestion, cough, sneezing, sore throat, etc.  Prop up at night time, as laying flat can worsen symptoms.  You should notice improvement over the next week or so, but cough can sometimes linger.

## 2022-01-20 NOTE — ED Provider Notes (Signed)
[facility]    CSN: [id number] Arrival date & time: 01/20/22  1207     History   Chief Complaint Chief Complaint  Patient presents with   Cough   Shortness of Breath    HPI James [name] is a 61 y.o. male.  Presents with 2-day history of cough Feels mucus in his chest but cough is dry Some nasal congestion/drainage in the throat Feels it is worse when he lays flat  Denies any fever, sinus pressure, sore throat, rash, GI symptoms No known sick contacts Tried one OTC cough pill  Denies history of COPD or asthma Never smoker  HTN hx, takes amlodipine and lasix daily No headache, vision changes, chest pain, shortness of breath, abdominal pain  Past Medical History:  Diagnosis Date   A-fib [facility])    Hypercholesteremia    Hypertension     Patient Active Problem List   Diagnosis Date Noted   Snoring 12/10/2018   Hypertensive emergency 02/12/2018   Acute pulmonary edema ([facility]) 02/12/2018   Unspecified atrial fibrillation ([facility]) 02/12/2018   Elevated troponin 02/12/2018   Hypertensive urgency 02/12/2018   Acute respiratory failure with hypoxia ([facility]) 02/11/2018   Essential hypertension 10/04/2014    Past Surgical History:  Procedure Laterality Date   LEFT HEART CATH AND CORONARY ANGIOGRAPHY N/A 02/12/2018   Procedure: LEFT HEART CATH AND CORONARY ANGIOGRAPHY;  Surgeon: Elder Negus, MD;  Location: [facility];  Service: Cardiovascular;  Laterality: N/A;       Home Medications    Prior to Admission medications   Medication Sig Start Date End Date Taking? Authorizing Provider  acetaminophen (TYLENOL) 325 MG tablet Take 2 tablets (650 mg total) by mouth every 6 (six) hours as needed. 01/09/21   Wallis Bamberg, PA-C  amLODipine (NORVASC) 10 MG tablet TAKE 1 TABLET (10 MG TOTAL) BY MOUTH DAILY. 03/16/21 03/16/22  Arthor Captain, PA-C  atorvastatin (LIPITOR) 20 MG tablet Take 1 tablet (20 mg total) by mouth daily. 08/31/19   Patwardhan,  Anabel Bene, MD  atorvastatin (LIPITOR) 20 MG tablet TAKE 1 TABLET (20 MG TOTAL) BY MOUTH DAILY FOR 30 DAYS. 04/03/20 04/03/21  Marlon Pel, PA-C  benazepril (LOTENSIN) 20 MG tablet TAKE 0.5 TABLETS (10 MG TOTAL) BY MOUTH DAILY. 08/31/19 08/30/20  Patwardhan, Anabel Bene, MD  benazepril (LOTENSIN) 20 MG tablet TAKE 1 TABLET (20 MG TOTAL) BY MOUTH DAILY FOR 30 DAYS. 03/16/21 03/16/22  Arthor Captain, PA-C  benzonatate (TESSALON) 100 MG capsule Take 1 capsule (100 mg total) by mouth every 8 (eight) hours. 04/18/21   Darrick Grinder, PA-C  cetirizine (ZYRTEC ALLERGY) 10 MG tablet Take 1 tablet (10 mg total) by mouth daily. 01/20/22   Aislinn Feliz, Lurena Joiner, PA-C  Colchicine 0.6 MG CAPS TAKE 1 CAPSULE (0.6 MG TOTAL) BY MOUTH 2 TIMES DAILY FOR 10 DAYS. 12/15/19 12/14/20  Tonia Ghent, PA-C  Cyanocobalamin (VITAMIN B-12 PO) Take 1 tablet by mouth daily.    [provider]  dextromethorphan-guaiFENesin (MUCINEX DM) 30-600 MG 12hr tablet Take 1 tablet by mouth 2 (two) times daily for 5 days. 01/20/22 01/25/22  Ernesta Trabert, Lurena Joiner, PA-C  doxycycline (VIBRA-TABS) 100 MG tablet Take 1 tablet (100 mg total) by mouth 2 (two) times daily. 04/18/21   Darrick Grinder, PA-C  furosemide (LASIX) 20 MG tablet Take 1 tablet (20 mg total) by mouth daily. 11/29/21   Tomi Bamberger, PA-C  HYDROcodone-acetaminophen (NORCO) 7.5-325 MG tablet Take 1 tablet by mouth every 6 (six) hours as needed  for moderate pain. 10/24/18   Eustace Moore, MD  isosorbide-hydrALAZINE (BIDIL) 20-37.5 MG tablet Take 2 tablets by mouth 2 (two) times daily. 08/31/19   Patwardhan, Manish J, MD  isosorbide-hydrALAZINE (BIDIL) 20-37.5 MG tablet Take 2 tablets by mouth 2 (two) times daily. 03/16/21   Arthor Captain, PA-C  methylPREDNISolone (MEDROL DOSEPAK) 4 MG TBPK tablet Take as directed 12/16/21   Lurline Idol, FNP  tiZANidine (ZANAFLEX) 4 MG tablet Take 1 tablet (4 mg total) by mouth at bedtime. 01/09/21   Wallis Bamberg, PA-C    Family  History Family History  Problem Relation Age of Onset   Diabetes Mother    CAD Brother     Social History Social History   Tobacco Use   Smoking status: Never   Smokeless tobacco: Never  Substance Use Topics   Alcohol use: Not Currently    Alcohol/week: 5.0 standard drinks of alcohol    Types: 5 Shots of liquor per week   Drug use: No     Allergies   Patient has no known allergies.   Review of Systems Review of Systems  Respiratory:  Positive for cough.    Per HPI  Physical Exam Triage Vital Signs ED Triage Vitals  Enc Vitals Group     BP 01/20/22 1417 (!) 187/101     Pulse Rate 01/20/22 1417 84     Resp 01/20/22 1417 16     Temp 01/20/22 1417 98.1 F (36.7 C)     Temp Source 01/20/22 1417 Oral     SpO2 01/20/22 1417 95 %     Weight --      Height --      Head Circumference --      Peak Flow --      Pain Score 01/20/22 1415 0     Pain Loc --      Pain Edu? --      Excl. in [place]? --    No data found.  Updated Vital Signs BP (!) 187/101 (BP Location: Left Arm)   Pulse 84   Temp 98.1 F (36.7 C) (Oral)   Resp 16   SpO2 95%   Physical Exam Vitals and nursing note reviewed.  Constitutional:      General: He is not in acute distress. HENT:     Mouth/Throat:     Mouth: Mucous membranes are moist.     Pharynx: Oropharynx is clear. Uvula midline. No posterior oropharyngeal erythema.     Tonsils: No tonsillar exudate or tonsillar abscesses.  Cardiovascular:     Rate and Rhythm: Normal rate and regular rhythm.     Heart sounds: Normal heart sounds.  Pulmonary:     Effort: Pulmonary effort is normal. No respiratory distress.     Breath sounds: Normal breath sounds. No wheezing.  Musculoskeletal:     Cervical back: Normal range of motion.  Lymphadenopathy:     Cervical: No cervical adenopathy.  Neurological:     General: No focal deficit present.     Mental Status: He is alert and oriented to person, place, and time.     Cranial Nerves: Cranial  nerves 2-12 are intact. No cranial nerve deficit.     Sensory: Sensation is intact.     Motor: Motor function is intact.     Coordination: Coordination is intact.     UC Treatments / Results  Labs (all labs ordered are listed, but only abnormal results are displayed) Labs Reviewed - No data to display  EKG  Radiology No results found.  Procedures Procedures   Medications Ordered in UC Medications - No data to display  Initial Impression / Assessment and Plan / UC Course  I have reviewed the triage vital signs and the nursing notes.  Pertinent labs & imaging results that were available during my care of the patient were reviewed by me and considered in my medical decision making (see chart for details).  Lungs are clear, well-appearing, afebrile Discussed symptomatic care for viral etiology, declined viral testing today Mucinex DM BID, can add daily allergy medicine  History of hypertension, elevated in clinic today but he takes medications as prescribed  Recheck 170/93 Will continue HTN meds and follow with PCP Work note provided Return precautions discussed. Patient agrees to plan  Final Clinical Impressions(s) / UC Diagnoses   Final diagnoses:  Viral URI with cough     Discharge Instructions      I recommend taking the mucinex DM twice daily for the next 5 days Make sure you take with lots of water.  You can also take the daily allergy medicine (zyrtec) which can help with congestion, cough, sneezing, sore throat, etc.  Prop up at night time, as laying flat can worsen symptoms.  You should notice improvement over the next week or so, but cough can sometimes linger.    ED Prescriptions     Medication Sig Dispense Auth. Provider   dextromethorphan-guaiFENesin (MUCINEX DM) 30-600 MG 12hr tablet  (Status: Discontinued) Take 1 tablet by mouth 2 (two) times daily for 5 days. 10 tablet Wali Reinheimer, PA-C   cetirizine (ZYRTEC ALLERGY) 10 MG tablet  (Status:  Discontinued) Take 1 tablet (10 mg total) by mouth daily. 30 tablet Ladarrian Asencio, PA-C   cetirizine (ZYRTEC ALLERGY) 10 MG tablet Take 1 tablet (10 mg total) by mouth daily. 30 tablet Sarim Rothman, PA-C   dextromethorphan-guaiFENesin (MUCINEX DM) 30-600 MG 12hr tablet Take 1 tablet by mouth 2 (two) times daily for 5 days. 10 tablet Ianna Salmela, Lurena Joiner, PA-C      PDMP not reviewed this encounter.   Brooklen Runquist, [place] 01/20/22 1531

## 2022-01-20 NOTE — ED Triage Notes (Signed)
Pt is here for cough , pt states when he lies down he has SOB, runny nose  and nasal congestion  x2days .

## 2022-05-06 ENCOUNTER — Ambulatory Visit (HOSPITAL_COMMUNITY)
Admission: EM | Admit: 2022-05-06 | Discharge: 2022-05-06 | Disposition: A | Discharge status: Home / Self Care | Payer: No Typology Code available for payment source | Attending: Internal Medicine | Admitting: Internal Medicine

## 2022-05-06 ENCOUNTER — Encounter (HOSPITAL_COMMUNITY): Payer: Self-pay | Admitting: Emergency Medicine

## 2022-05-06 DIAGNOSIS — Z8679 Personal history of other diseases of the circulatory system: Secondary | ICD-10-CM

## 2022-05-06 DIAGNOSIS — L304 Erythema intertrigo: Secondary | ICD-10-CM | POA: Diagnosis not present

## 2022-05-06 DIAGNOSIS — I10 Essential (primary) hypertension: Secondary | ICD-10-CM

## 2022-05-06 MED ORDER — NYSTATIN 100000 UNIT/GM EX OINT: 1.0000 | TOPICAL_OINTMENT | Freq: Two times a day (BID) | CUTANEOUS | 1 refills | Status: DC | PRN

## 2022-05-06 MED ORDER — CETIRIZINE HCL 10 MG PO TABS: 10.0000 mg | ORAL_TABLET | Freq: Every day | ORAL | 0 refills | Status: DC | PRN

## 2022-05-06 NOTE — Discharge Instructions (Addendum)
You were given two prescriptions. One prescription is an ointment (Nystatin) used to treat yeast and fungal infections of the skin.  The second is an antihistamine (Zyrtec) for itching.  Take both as directed.  Try to keep the area dry and open to the air as much as possible.  Your BP was elevated at this visit. I recommend you follow up with your PCP as soon as possible. Monitor BP daily preferably at the same time each day. If readings persistently >140/90, follow up with our office or your PCP sooner. If chest pain, shortness of breath, headache, numbness/tingling, slurred speech, or facial drooping develop, go directly to the ER.

## 2022-05-06 NOTE — ED Triage Notes (Signed)
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 Reports for several months having skin irritation/itching under belly now spreading towards groin.

## 2022-05-06 NOTE — ED Provider Notes (Signed)
[facility]   Note:  This document was prepared using Dragon voice recognition software and may include unintentional dictation errors.  MRN: [id number] DOB: Oct 26, 1960 DATE: 05/06/22   Subjective:  Chief Complaint:  Chief Complaint  Patient presents with   Pruritis     HPI: James [name] is a 62 y.o. male presenting for rash and itching below his stomach and groin for the past month.  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  He states the area on her stomach often gets sweaty.  He has tried OTC antifungal creams with no improvement.  The rash is predominantly on the underside of his stomach, but has started to spread to his groin.  Denies fever, discharge, burning. Endorses itching. Presents NAD.  Prior to Admission medications   Medication Sig Start Date End Date Taking? Authorizing Provider  cetirizine (ZYRTEC ALLERGY) 10 MG tablet Take 1 tablet (10 mg total) by mouth daily as needed (itching). 05/06/22  Yes Joannah Gitlin P, PA-C  nystatin ointment (MYCOSTATIN) Apply 1 Application topically 2 (two) times daily as needed. 05/06/22  Yes Jaja Switalski P, PA-C  acetaminophen (TYLENOL) 325 MG tablet Take 2 tablets (650 mg total) by mouth every 6 (six) hours as needed. 01/09/21   Jaynee Eagles, PA-C  amLODipine (NORVASC) 10 MG tablet TAKE 1 TABLET (10 MG TOTAL) BY MOUTH DAILY. 03/16/21 03/16/22  Margarita Mail, PA-C  atorvastatin (LIPITOR) 20 MG tablet Take 1 tablet (20 mg total) by mouth daily. 08/31/19   Patwardhan, Reynold Bowen, MD  atorvastatin (LIPITOR) 20 MG tablet TAKE 1 TABLET (20 MG TOTAL) BY MOUTH DAILY FOR 30 DAYS. 04/03/20 04/03/21  Delos Haring, PA-C  benazepril (LOTENSIN) 20 MG tablet TAKE 1 TABLET (20 MG TOTAL) BY MOUTH DAILY FOR 30 DAYS. 03/16/21 03/16/22  Harris, Abigail, PA-C  isosorbide-hydrALAZINE (BIDIL) 20-37.5 MG tablet Take 2 tablets by mouth 2 (two) times daily. 03/16/21   Margarita Mail, PA-C     No Known  Allergies  History:   Past Medical History:  Diagnosis Date   A-fib ([place])    Hypercholesteremia    Hypertension      Past Surgical History:  Procedure Laterality Date   LEFT HEART CATH AND CORONARY ANGIOGRAPHY N/A 02/12/2018   Procedure: LEFT HEART CATH AND CORONARY ANGIOGRAPHY;  Surgeon: Nigel Mormon, MD;  Location: [facility];  Service: Cardiovascular;  Laterality: N/A;    Family History  Problem Relation Age of Onset   Diabetes Mother    CAD Brother     Social History   Tobacco Use   Smoking status: Never   Smokeless tobacco: Never  Substance Use Topics   Alcohol use: Not Currently    Alcohol/week: 5.0 standard drinks of alcohol    Types: 5 Shots of liquor per week   Drug use: No    Review of Systems  Constitutional:  Negative for fever.  Respiratory:  Negative for shortness of breath.   Cardiovascular:  Negative for chest pain.  Genitourinary:  Negative for genital sores.  Skin:  Positive for rash.  Neurological:  Negative for headaches.     Objective:   Vitals: BP (!) 188/93 (BP Location: Left Arm)   Pulse 70   Temp 98.3 F (36.8 C) (Oral)   Resp 17   SpO2 96%   Physical Exam Constitutional:      General: He is not in acute distress.    Appearance: Normal appearance. He is well-developed.  He is morbidly obese. He is not ill-appearing or toxic-appearing.  HENT:     Head: Normocephalic and atraumatic.  Cardiovascular:     Rate and Rhythm: Normal rate. Rhythm irregularly irregular.     Heart sounds: Normal heart sounds.     Comments: Patient has history of A-fib Pulmonary:     Effort: Pulmonary effort is normal.     Breath sounds: Normal breath sounds.     Comments: Clear to auscultation bilaterally  Abdominal:     General: Bowel sounds are normal.     Palpations: Abdomen is soft.     Tenderness: There is no abdominal tenderness.  Skin:    General: Skin is warm and dry.     Findings: Rash present.     Comments: Patient has  dark macular rash on underside of stomach.  Flaking noted throughout the rash.  No warmth, erythema, discharge.  Appearance consistent with intertrigo  Neurological:     General: No focal deficit present.     Mental Status: He is alert.  Psychiatric:        Mood and Affect: Mood and affect normal.     Results:  Labs: No results found for this or any previous visit (from the past 24 hour(s)).  Radiology: No results found.   UC Course/Treatments:  Procedures: Procedures   Medications Ordered in UC: Medications - No data to display   Assessment and Plan :     ICD-10-CM   1. Intertrigo  L30.4     2. Essential hypertension  I10     3. History of atrial fibrillation  Z86.79      Intertrigo:  Afebrile, nontoxic-appearing, NAD. VSS. DDX includes but not limited to: yeast, fungal, contact dermatitis Topical nystatin was prescribed to cover yeast and fungal infection. Instructions given to keep area dry and open to the air. Zyrtec given for pruritus. Strict ED precautions were given and patient verbalized understanding.  Essential Hypertension: Asymptomatic. Patient admits to not taking his blood pressure medicine for over a month.  Currently on 3 medications.  States he will go home and start taking them today after his visit.  Discharge instructions given for BP monitoring and patient instructed to follow-up with his PCP soon as possible.  Strict ED precautions were given and patient verbalized understanding.   History of Atrial Fibrillation:  Asymptomatic Patient has known history of atrial fibrillation. Patient states he will resume his medications today as soon as he gets home. Discharge instructions given for BP monitoring and patient instructed to follow-up with his PCP as soon as possible. Strict ED precautions were given and patient verbalized understanding.    ED Discharge Orders          Ordered    nystatin ointment (MYCOSTATIN)  2 times daily PRN       Note to  Pharmacy: [id number]   05/06/22 0847    cetirizine (ZYRTEC ALLERGY) 10 MG tablet  Daily PRN       Note to Pharmacy: [id number]   05/06/22 0848             PDMP not reviewed this encounter.     Carmie End, PA-C 05/06/22 [id number]

## 2022-05-22 ENCOUNTER — Ambulatory Visit: Payer: Self-pay | Admitting: Nurse Practitioner

## 2022-06-20 ENCOUNTER — Inpatient Hospital Stay (HOSPITAL_COMMUNITY)
Admission: EM | Admit: 2022-06-20 | Discharge: 2022-06-25 | DRG: 291 | Disposition: A | Discharge status: Home / Self Care | Payer: BLUE CROSS/BLUE SHIELD | Attending: Internal Medicine | Admitting: Internal Medicine

## 2022-06-20 ENCOUNTER — Other Ambulatory Visit: Payer: Self-pay

## 2022-06-20 ENCOUNTER — Encounter (HOSPITAL_COMMUNITY): Payer: Self-pay

## 2022-06-20 ENCOUNTER — Emergency Department (HOSPITAL_COMMUNITY): Payer: BLUE CROSS/BLUE SHIELD

## 2022-06-20 DIAGNOSIS — I160 Hypertensive urgency: Secondary | ICD-10-CM | POA: Diagnosis present

## 2022-06-20 DIAGNOSIS — E7800 Pure hypercholesterolemia, unspecified: Secondary | ICD-10-CM | POA: Diagnosis present

## 2022-06-20 DIAGNOSIS — F1490 Cocaine use, unspecified, uncomplicated: Secondary | ICD-10-CM

## 2022-06-20 DIAGNOSIS — I21A1 Myocardial infarction type 2: Secondary | ICD-10-CM | POA: Diagnosis present

## 2022-06-20 DIAGNOSIS — Z8249 Family history of ischemic heart disease and other diseases of the circulatory system: Secondary | ICD-10-CM

## 2022-06-20 DIAGNOSIS — E669 Obesity, unspecified: Secondary | ICD-10-CM

## 2022-06-20 DIAGNOSIS — R0602 Shortness of breath: Secondary | ICD-10-CM | POA: Diagnosis not present

## 2022-06-20 DIAGNOSIS — M109 Gout, unspecified: Secondary | ICD-10-CM

## 2022-06-20 DIAGNOSIS — I4819 Other persistent atrial fibrillation: Secondary | ICD-10-CM

## 2022-06-20 DIAGNOSIS — I130 Hypertensive heart and chronic kidney disease with heart failure and stage 1 through stage 4 chronic kidney disease, or unspecified chronic kidney disease: Secondary | ICD-10-CM | POA: Diagnosis not present

## 2022-06-20 DIAGNOSIS — Z597 Insufficient social insurance and welfare support: Secondary | ICD-10-CM

## 2022-06-20 DIAGNOSIS — Z91148 Patient's other noncompliance with medication regimen for other reason: Secondary | ICD-10-CM

## 2022-06-20 DIAGNOSIS — I428 Other cardiomyopathies: Secondary | ICD-10-CM | POA: Diagnosis present

## 2022-06-20 DIAGNOSIS — Z833 Family history of diabetes mellitus: Secondary | ICD-10-CM

## 2022-06-20 DIAGNOSIS — Z79899 Other long term (current) drug therapy: Secondary | ICD-10-CM

## 2022-06-20 DIAGNOSIS — I4892 Unspecified atrial flutter: Secondary | ICD-10-CM | POA: Diagnosis not present

## 2022-06-20 DIAGNOSIS — I161 Hypertensive emergency: Secondary | ICD-10-CM | POA: Diagnosis present

## 2022-06-20 DIAGNOSIS — Z7901 Long term (current) use of anticoagulants: Secondary | ICD-10-CM

## 2022-06-20 DIAGNOSIS — I482 Chronic atrial fibrillation, unspecified: Secondary | ICD-10-CM | POA: Diagnosis present

## 2022-06-20 DIAGNOSIS — I2510 Atherosclerotic heart disease of native coronary artery without angina pectoris: Secondary | ICD-10-CM | POA: Diagnosis present

## 2022-06-20 DIAGNOSIS — Z6838 Body mass index (BMI) 38.0-38.9, adult: Secondary | ICD-10-CM

## 2022-06-20 DIAGNOSIS — I5033 Acute on chronic diastolic (congestive) heart failure: Secondary | ICD-10-CM | POA: Diagnosis present

## 2022-06-20 DIAGNOSIS — N179 Acute kidney failure, unspecified: Secondary | ICD-10-CM | POA: Insufficient documentation

## 2022-06-20 DIAGNOSIS — N189 Chronic kidney disease, unspecified: Secondary | ICD-10-CM | POA: Insufficient documentation

## 2022-06-20 DIAGNOSIS — I509 Heart failure, unspecified: Secondary | ICD-10-CM

## 2022-06-20 DIAGNOSIS — N1831 Chronic kidney disease, stage 3a: Secondary | ICD-10-CM

## 2022-06-20 DIAGNOSIS — I10 Essential (primary) hypertension: Secondary | ICD-10-CM | POA: Diagnosis present

## 2022-06-20 DIAGNOSIS — I5021 Acute systolic (congestive) heart failure: Secondary | ICD-10-CM | POA: Diagnosis present

## 2022-06-20 DIAGNOSIS — R079 Chest pain, unspecified: Secondary | ICD-10-CM

## 2022-06-20 LAB — CBC
HCT: 40.1 % (ref 39.0–52.0)
Hemoglobin: 12.2 g/dL — ABNORMAL LOW (ref 13.0–17.0)
MCH: 25.8 pg — ABNORMAL LOW (ref 26.0–34.0)
MCHC: 30.4 g/dL (ref 30.0–36.0)
MCV: 85 fL (ref 80.0–100.0)
Platelets: 159 10*3/uL (ref 150–400)
RBC: 4.72 MIL/uL (ref 4.22–5.81)
RDW: 16.2 % — ABNORMAL HIGH (ref 11.5–15.5)
WBC: 4.3 10*3/uL (ref 4.0–10.5)
nRBC: 0 % (ref 0.0–0.2)

## 2022-06-20 LAB — BASIC METABOLIC PANEL
Anion gap: 11 (ref 5–15)
BUN: 35 mg/dL — ABNORMAL HIGH (ref 8–23)
CO2: 21 mmol/L — ABNORMAL LOW (ref 22–32)
Calcium: 8.5 mg/dL — ABNORMAL LOW (ref 8.9–10.3)
Chloride: 106 mmol/L (ref 98–111)
Creatinine, Ser: 1.87 mg/dL — ABNORMAL HIGH (ref 0.61–1.24)
GFR, Estimated: 40 mL/min — ABNORMAL LOW (ref >60)
Glucose, Bld: 111 mg/dL — ABNORMAL HIGH (ref 70–99)
Potassium: 3.6 mmol/L (ref 3.5–5.1)
Sodium: 138 mmol/L (ref 135–145)

## 2022-06-20 LAB — TROPONIN I (HIGH SENSITIVITY)
Troponin I (High Sensitivity): 72 ng/L — ABNORMAL HIGH (ref <18)
Troponin I (High Sensitivity): 68 ng/L — ABNORMAL HIGH (ref <18)

## 2022-06-20 LAB — BRAIN NATRIURETIC PEPTIDE: B Natriuretic Peptide: 561.7 pg/mL — ABNORMAL HIGH (ref 0.0–100.0)

## 2022-06-20 MED ORDER — BENAZEPRIL HCL 20 MG PO TABS
20.0000 mg | ORAL_TABLET | Freq: Every day | ORAL | Status: DC
  Administered 2022-06-20: 20 mg via ORAL
  Filled 2022-06-20: qty 1

## 2022-06-20 MED ORDER — SODIUM CHLORIDE 0.9 % IV SOLN
1.0000 g | Freq: Once | INTRAVENOUS | Status: DC
  Administered 2022-06-21: 1 g via INTRAVENOUS
  Filled 2022-06-20: qty 10

## 2022-06-20 MED ORDER — AZITHROMYCIN 250 MG PO TABS: 500.0000 mg | ORAL_TABLET | Freq: Every day | ORAL | Status: DC

## 2022-06-20 MED ORDER — IPRATROPIUM-ALBUTEROL 0.5-2.5 (3) MG/3ML IN SOLN
3.0000 mL | Freq: Once | RESPIRATORY_TRACT | Status: AC
  Administered 2022-06-20: 3 mL via RESPIRATORY_TRACT
  Filled 2022-06-20: qty 3

## 2022-06-20 MED ORDER — FUROSEMIDE 10 MG/ML IJ SOLN
40.0000 mg | Freq: Once | INTRAMUSCULAR | Status: AC
  Administered 2022-06-20: 40 mg via INTRAVENOUS
  Filled 2022-06-20: qty 4

## 2022-06-20 MED ORDER — AMLODIPINE BESYLATE 5 MG PO TABS
10.0000 mg | ORAL_TABLET | Freq: Once | ORAL | Status: AC
  Administered 2022-06-20: 10 mg via ORAL
  Filled 2022-06-20: qty 2

## 2022-06-20 NOTE — ED Provider Notes (Signed)
EMERGENCY DEPARTMENT AT [facility] Provider Note   CSN: [id number] Arrival date & time: 06/20/22  1216     History  Chief Complaint  Patient presents with   Shortness of Breath    James [name] is a 62 y.o. male.  Pt complains of shortness of breath.  Pt reports he is out of one of his medications.  Pt has a history of atrial fibrillation.  Pt states he does not take his medications very often.  Pt denies fever or chills,  Pt reports difficulty breathing when he is lying down.    The history is provided by the patient. No language interpreter was used.  Shortness of Breath Severity:  Severe Onset quality:  Gradual Timing:  Constant Progression:  Worsening Chronicity:  New Relieved by:  Nothing Worsened by:  Nothing Ineffective treatments:  None tried Associated symptoms: chest pain        Home Medications Prior to Admission medications   Medication Sig Start Date End Date Taking? Authorizing Provider  acetaminophen (TYLENOL) 325 MG tablet Take 2 tablets (650 mg total) by mouth every 6 (six) hours as needed. 01/09/21  Yes Wallis Bamberg, PA-C  amLODipine (NORVASC) 10 MG tablet TAKE 1 TABLET (10 MG TOTAL) BY MOUTH DAILY. 03/16/21 06/20/22 Yes Harris, Abigail, PA-C  benazepril (LOTENSIN) 20 MG tablet TAKE 1 TABLET (20 MG TOTAL) BY MOUTH DAILY FOR 30 DAYS. 03/16/21 06/20/22 Yes Harris, Abigail, PA-C  cetirizine (ZYRTEC ALLERGY) 10 MG tablet Take 1 tablet (10 mg total) by mouth daily as needed (itching). 05/06/22  Yes Hermanns, Ashlee P, PA-C  ibuprofen (ADVIL) 200 MG tablet Take 200 mg by mouth 2 (two) times daily. Uses for feet swelling   Yes [provider]  isosorbide-hydrALAZINE (BIDIL) 20-37.5 MG tablet Take 2 tablets by mouth 2 (two) times daily. 03/16/21  Yes Harris, Abigail, PA-C  atorvastatin (LIPITOR) 20 MG tablet Take 1 tablet (20 mg total) by mouth daily. Patient not taking: Reported on 06/20/2022 08/31/19   Elder Negus, MD   nystatin ointment (MYCOSTATIN) Apply 1 Application topically 2 (two) times daily as needed. Patient not taking: Reported on 06/20/2022 05/06/22   Hermanns, Ashlee P, PA-C      Allergies    Patient has no known allergies.    Review of Systems   Review of Systems  Respiratory:  Positive for shortness of breath.   Cardiovascular:  Positive for chest pain.  All other systems reviewed and are negative.   Physical Exam Updated Vital Signs BP (!) 189/97 (BP Location: Right Arm)   Pulse 88   Temp 97.6 F (36.4 C) (Oral)   Resp 19   SpO2 100%  Physical Exam Vitals and nursing note reviewed.  Constitutional:      Appearance: He is well-developed.  HENT:     Head: Normocephalic.  Cardiovascular:     Rate and Rhythm: Normal rate and regular rhythm.  Pulmonary:     Effort: Pulmonary effort is normal.  Abdominal:     General: There is no distension.     Palpations: Abdomen is soft.  Musculoskeletal:        General: Normal range of motion.     Cervical back: Normal range of motion.  Skin:    General: Skin is warm.  Neurological:     Mental Status: He is alert and oriented to person, place, and time.     ED Results / Procedures / Treatments   Labs (all labs ordered are listed,  but only abnormal results are displayed) Labs Reviewed  BASIC METABOLIC PANEL - Abnormal; Notable for the following components:      Result Value   CO2 21 (*)    Glucose, Bld 111 (*)    BUN 35 (*)    Creatinine, Ser 1.87 (*)    Calcium 8.5 (*)    GFR, Estimated 40 (*)    All other components within normal limits  CBC - Abnormal; Notable for the following components:   Hemoglobin 12.2 (*)    MCH 25.8 (*)    RDW 16.2 (*)    All other components within normal limits  TROPONIN I (HIGH SENSITIVITY) - Abnormal; Notable for the following components:   Troponin I (High Sensitivity) 72 (*)    All other components within normal limits  TROPONIN I (HIGH SENSITIVITY) - Abnormal; Notable for the following  components:   Troponin I (High Sensitivity) 68 (*)    All other components within normal limits  BRAIN NATRIURETIC PEPTIDE    EKG None  Radiology DG Chest 2 View  Result Date: 06/20/2022 CLINICAL DATA:  Shortness of breath EXAM: CHEST - 2 VIEW COMPARISON:  CXR 04/18/21 FINDINGS: Cardiomegaly. Small right pleural effusion. No pneumothorax. Possible retrocardiac opacity. There are prominent bilateral interstitial opacities, which may be related to mild pulmonary edema or atypical infection. No radiographically apparent displaced rib fractures. Visualized upper abdomen is unremarkable. Vertebral body heights are maintained. IMPRESSION: 1. Cardiomegaly with small right pleural effusion and mild pulmonary edema. 2. Possible retrocardiac opacity, which could represent atelectasis or pneumonia. Electronically Signed   By: Lorenza Cambridge M.D.   On: 06/20/2022 13:05    Procedures Procedures    Medications Ordered in ED Medications - No data to display  ED Course/ Medical Decision Making/ A&P                             Medical Decision Making Pt complains of shortness of breath.  Pt reports  he has not been taking his medications   Amount and/or Complexity of Data Reviewed External Data Reviewed: notes.    Details: Previous Ed notes reviewed Labs: ordered. Decision-making details documented in ED Course.    Details: Troponin elevated x 2, BNp elevated,  Radiology: ordered and independent interpretation performed. Decision-making details documented in ED Course.    Details: Mild pulmonary edema  ECG/medicine tests: ordered and independent interpretation performed. Decision-making details documented in ED Course.   Pharmacy reviewed pt's medications.  Pt has not filled in medication recently.  His only active rx is zyrtec.         Final Clinical Impression(s) / ED Diagnoses Final diagnoses:  SOB (shortness of breath)  Chest pain, unspecified type  Primary hypertension   Congestive heart failure, unspecified HF chronicity, unspecified heart failure type (HCC)    Rx / DC Orders ED Discharge Orders     None      Pt's care turned over at 10:30 to oncoming pa.  Hospitalist admission recommended   James [name] 06/20/22 2234    Alvira Monday, MD 06/20/22 [phone]

## 2022-06-20 NOTE — ED Provider Notes (Signed)
Received at shift change from care leslie Sophia PA-C please see note for full detail  In short patient with medical history including hypertension, A-fib, hyperlipidemia, presenting with complaints of chest tightness as well as leg swelling.  Today about a week ago, states he noticed swelling in his legs, he did then start develop some shortness of breath, with orthopnea, states that he does have a slight cough, nonproductive, no associated fever chills general body aches.  Patient states that the chest pressure does not radiate, is nonpleuritic, does not go to his back, no near syncope nausea or vomiting.  States that he has not seen his cardiologist in a long time since he lost his insurance, has only been taking his blood pressure medication intermittently.  Currently feels better after treatment.  Per previous provider admit to medicine   Physical Exam  BP (!) 169/103   Pulse 93   Temp 97.6 F (36.4 C) (Oral)   Resp 20   SpO2 100%   Physical Exam Vitals and nursing note reviewed.  Constitutional:      General: He is not in acute distress.    Appearance: He is not ill-appearing.  HENT:     Head: Normocephalic and atraumatic.     Nose: No congestion.  Eyes:     Conjunctiva/sclera: Conjunctivae normal.  Cardiovascular:     Rate and Rhythm: Normal rate and regular rhythm.     Pulses: Normal pulses.     Heart sounds: No murmur heard.    No friction rub. No gallop.  Pulmonary:     Effort: No respiratory distress.     Breath sounds: No wheezing, rhonchi or rales.     Comments: Able to speak in full sentences, no evidence of respiratory distress, patient has a tight sounding chest with expiratory wheezing, bibasilar Rales heard bilaterally. Musculoskeletal:     Right lower leg: Edema present.     Left lower leg: Edema present.     Comments: Patient has 1+ pitting pedal edema bilaterally.   Skin:    General: Skin is warm and dry.  Neurological:     Mental Status: He is alert.   Psychiatric:        Mood and Affect: Mood normal.     Procedures  Procedures  ED Course / MDM    Medical Decision Making Amount and/or Complexity of Data Reviewed Labs: ordered. Radiology: ordered.  Risk Prescription drug management. Decision regarding hospitalization.    Lab Tests:  I Ordered, and personally interpreted labs.  The pertinent results include: CBC shows no [place] anemia hemoglobin 12.2, BMP reveals CO2 of 21 glucose 111, BUN 35, creatinine 1.87, above baseline, GFR 40, BNP is 561, free troponin was 72, second opponent 68   Imaging Studies ordered:  I ordered imaging studies including chest x-ray I independently visualized and interpreted imaging which showed cardiomegaly right pleural effusion mild pulmonary congestion, possible pneumonia I agree with the radiologist interpretation   Cardiac Monitoring:  The patient was maintained on a cardiac monitor.  I personally viewed and interpreted the cardiac monitored which showed an underlying rhythm of: afib without ischemia    Medicines ordered and prescription drug management:  I ordered medication including bronchodilators I have reviewed the patients home medicines and have made adjustments as needed  Critical Interventions:  N/a   Reevaluation:  Assessed the patient, patient is tight sounding chest, with wheezing, with a productive cough, x-rays concern for possible pneumonia, will provide with a bronchodilator, started on antibiotics.  Will admit to medicine  Consultations Obtained:  I requested consultation with the Dr. Imogene Burn,  and discussed lab and imaging findings as well as pertinent plan - they recommend: He we will admit the patient.    Test Considered:  N/A    Rule out I have low suspicion for ACS as history is atypical, EKG was sinus rhythm without signs of ischemia.  Patient does have a slightly elevated troponin but is downtrending, suspicion of elevation is likely demand  ischemia from volume overload as well as poor kidney function.  Low suspicion for PE as patient denies pleuritic chest pain, nontachypneic nonhypoxic, presentation atypical etiology.  Patient does endorse shortness of breath likely from volume overload as seen on chest x-ray and BNP.  Low suspicion for AAA or aortic dissection as history is atypical, patient has low risk factors.  Low suspicion for systemic infection as patient is nontoxic-appearing, vital signs reassuring, no obvious source infection noted on exam.     Dispostion and problem list  After consideration of the diagnostic results and the patients response to treatment, I feel that the patent would benefit from admission.  Hypertensive emergency-has noted organ damage likely from uncontrolled hypertension, patient will need blood pressure management and continued monitoring Pneumonia-started on antibiotics will need further evaluation.           Carroll Sage, PA-C 06/21/22 Otilio Connors, April, MD 06/21/22 Glena Norfolk

## 2022-06-20 NOTE — ED Triage Notes (Signed)
Pt came in via POV d/t SOB & chest congestion (per pt) that has been bothering him since last Friday (06/13/22). States that has to sit up to be able to breath & cannot tolerate laying flat. Reports that he has not been taking his "fluid pills" as he should & unknown how many doses he has missed.

## 2022-06-21 ENCOUNTER — Encounter (HOSPITAL_COMMUNITY): Payer: Self-pay | Admitting: Internal Medicine

## 2022-06-21 ENCOUNTER — Other Ambulatory Visit (HOSPITAL_COMMUNITY): Payer: No Typology Code available for payment source

## 2022-06-21 DIAGNOSIS — I130 Hypertensive heart and chronic kidney disease with heart failure and stage 1 through stage 4 chronic kidney disease, or unspecified chronic kidney disease: Secondary | ICD-10-CM | POA: Diagnosis present

## 2022-06-21 DIAGNOSIS — I5021 Acute systolic (congestive) heart failure: Secondary | ICD-10-CM | POA: Diagnosis present

## 2022-06-21 DIAGNOSIS — I5033 Acute on chronic diastolic (congestive) heart failure: Secondary | ICD-10-CM | POA: Diagnosis not present

## 2022-06-21 DIAGNOSIS — I4819 Other persistent atrial fibrillation: Secondary | ICD-10-CM | POA: Diagnosis present

## 2022-06-21 DIAGNOSIS — R0602 Shortness of breath: Secondary | ICD-10-CM | POA: Diagnosis present

## 2022-06-21 DIAGNOSIS — Z7901 Long term (current) use of anticoagulants: Secondary | ICD-10-CM | POA: Diagnosis not present

## 2022-06-21 DIAGNOSIS — I10 Essential (primary) hypertension: Secondary | ICD-10-CM | POA: Diagnosis not present

## 2022-06-21 DIAGNOSIS — I428 Other cardiomyopathies: Secondary | ICD-10-CM | POA: Diagnosis present

## 2022-06-21 DIAGNOSIS — I160 Hypertensive urgency: Secondary | ICD-10-CM | POA: Diagnosis present

## 2022-06-21 DIAGNOSIS — E669 Obesity, unspecified: Secondary | ICD-10-CM | POA: Diagnosis present

## 2022-06-21 DIAGNOSIS — I2510 Atherosclerotic heart disease of native coronary artery without angina pectoris: Secondary | ICD-10-CM | POA: Diagnosis present

## 2022-06-21 DIAGNOSIS — N1831 Chronic kidney disease, stage 3a: Secondary | ICD-10-CM | POA: Diagnosis present

## 2022-06-21 DIAGNOSIS — I482 Chronic atrial fibrillation, unspecified: Secondary | ICD-10-CM | POA: Diagnosis not present

## 2022-06-21 DIAGNOSIS — N179 Acute kidney failure, unspecified: Secondary | ICD-10-CM | POA: Diagnosis not present

## 2022-06-21 DIAGNOSIS — Z79899 Other long term (current) drug therapy: Secondary | ICD-10-CM | POA: Diagnosis not present

## 2022-06-21 DIAGNOSIS — N189 Chronic kidney disease, unspecified: Secondary | ICD-10-CM | POA: Insufficient documentation

## 2022-06-21 DIAGNOSIS — Z597 Insufficient social insurance and welfare support: Secondary | ICD-10-CM | POA: Diagnosis not present

## 2022-06-21 DIAGNOSIS — I161 Hypertensive emergency: Secondary | ICD-10-CM | POA: Diagnosis present

## 2022-06-21 DIAGNOSIS — F1490 Cocaine use, unspecified, uncomplicated: Secondary | ICD-10-CM | POA: Insufficient documentation

## 2022-06-21 DIAGNOSIS — Z833 Family history of diabetes mellitus: Secondary | ICD-10-CM | POA: Diagnosis not present

## 2022-06-21 DIAGNOSIS — I4892 Unspecified atrial flutter: Secondary | ICD-10-CM | POA: Diagnosis not present

## 2022-06-21 DIAGNOSIS — Z8249 Family history of ischemic heart disease and other diseases of the circulatory system: Secondary | ICD-10-CM | POA: Diagnosis not present

## 2022-06-21 DIAGNOSIS — I21A1 Myocardial infarction type 2: Secondary | ICD-10-CM | POA: Diagnosis present

## 2022-06-21 DIAGNOSIS — Z6838 Body mass index (BMI) 38.0-38.9, adult: Secondary | ICD-10-CM | POA: Diagnosis not present

## 2022-06-21 DIAGNOSIS — Z91148 Patient's other noncompliance with medication regimen for other reason: Secondary | ICD-10-CM | POA: Diagnosis not present

## 2022-06-21 DIAGNOSIS — M109 Gout, unspecified: Secondary | ICD-10-CM | POA: Diagnosis present

## 2022-06-21 DIAGNOSIS — E7800 Pure hypercholesterolemia, unspecified: Secondary | ICD-10-CM | POA: Diagnosis present

## 2022-06-21 LAB — URINALYSIS, ROUTINE W REFLEX MICROSCOPIC
Bacteria, UA: NONE SEEN
Bilirubin Urine: NEGATIVE
Glucose, UA: NEGATIVE mg/dL
Ketones, ur: NEGATIVE mg/dL
Nitrite: NEGATIVE
Protein, ur: NEGATIVE mg/dL
Specific Gravity, Urine: 1.006 (ref 1.005–1.030)
pH: 5 (ref 5.0–8.0)

## 2022-06-21 LAB — RAPID URINE DRUG SCREEN, HOSP PERFORMED
Amphetamines: NOT DETECTED
Barbiturates: NOT DETECTED
Benzodiazepines: NOT DETECTED
Cocaine: NOT DETECTED
Opiates: NOT DETECTED
Tetrahydrocannabinol: NOT DETECTED

## 2022-06-21 LAB — HIV ANTIBODY (ROUTINE TESTING W REFLEX): HIV Screen 4th Generation wRfx: NONREACTIVE

## 2022-06-21 LAB — HEMOGLOBIN A1C
Hgb A1c MFr Bld: 6.1 % — ABNORMAL HIGH (ref 4.8–5.6)
Mean Plasma Glucose: 128.37 mg/dL

## 2022-06-21 MED ORDER — GUAIFENESIN ER 600 MG PO TB12
600.0000 mg | ORAL_TABLET | Freq: Two times a day (BID) | ORAL | Status: DC
  Administered 2022-06-21 – 2022-06-25 (×8): 600 mg via ORAL
  Filled 2022-06-21 (×8): qty 1

## 2022-06-21 MED ORDER — HEPARIN SODIUM (PORCINE) 5000 UNIT/ML IJ SOLN
5000.0000 [IU] | Freq: Three times a day (TID) | INTRAMUSCULAR | Status: DC
  Administered 2022-06-21: 5000 [IU] via SUBCUTANEOUS
  Filled 2022-06-21: qty 1

## 2022-06-21 MED ORDER — METOPROLOL TARTRATE 12.5 MG HALF TABLET
12.5000 mg | ORAL_TABLET | Freq: Two times a day (BID) | ORAL | Status: DC
  Administered 2022-06-21 – 2022-06-23 (×5): 12.5 mg via ORAL
  Filled 2022-06-21 (×5): qty 1

## 2022-06-21 MED ORDER — AMIODARONE LOAD VIA INFUSION
150.0000 mg | Freq: Once | INTRAVENOUS | Status: AC
  Administered 2022-06-21: 150 mg via INTRAVENOUS
  Filled 2022-06-21: qty 83.34

## 2022-06-21 MED ORDER — SODIUM CHLORIDE 0.9% FLUSH
3.0000 mL | Freq: Two times a day (BID) | INTRAVENOUS | Status: DC
  Administered 2022-06-21 – 2022-06-25 (×10): 3 mL via INTRAVENOUS

## 2022-06-21 MED ORDER — POTASSIUM CHLORIDE CRYS ER 20 MEQ PO TBCR
20.0000 meq | EXTENDED_RELEASE_TABLET | Freq: Every day | ORAL | Status: DC
  Administered 2022-06-21 – 2022-06-25 (×5): 20 meq via ORAL
  Filled 2022-06-21 (×5): qty 1

## 2022-06-21 MED ORDER — AMIODARONE HCL IN DEXTROSE 360-4.14 MG/200ML-% IV SOLN
60.0000 mg/h | INTRAVENOUS | Status: AC
  Administered 2022-06-21 (×2): 60 mg/h via INTRAVENOUS
  Filled 2022-06-21: qty 200

## 2022-06-21 MED ORDER — APIXABAN 5 MG PO TABS
5.0000 mg | ORAL_TABLET | Freq: Two times a day (BID) | ORAL | Status: DC
  Administered 2022-06-21 – 2022-06-25 (×9): 5 mg via ORAL
  Filled 2022-06-21 (×9): qty 1

## 2022-06-21 MED ORDER — SODIUM CHLORIDE 0.9 % IV SOLN: 250.0000 mL | INTRAVENOUS | Status: DC | PRN

## 2022-06-21 MED ORDER — AMIODARONE HCL IN DEXTROSE 360-4.14 MG/200ML-% IV SOLN
30.0000 mg/h | INTRAVENOUS | Status: DC
  Administered 2022-06-21 – 2022-06-24 (×6): 30 mg/h via INTRAVENOUS
  Filled 2022-06-21 (×6): qty 200

## 2022-06-21 MED ORDER — EMPAGLIFLOZIN 10 MG PO TABS
10.0000 mg | ORAL_TABLET | Freq: Every day | ORAL | Status: DC
  Administered 2022-06-21 – 2022-06-25 (×5): 10 mg via ORAL
  Filled 2022-06-21 (×5): qty 1

## 2022-06-21 MED ORDER — SODIUM CHLORIDE 0.9% FLUSH: 3.0000 mL | INTRAVENOUS | Status: DC | PRN

## 2022-06-21 MED ORDER — ISOSORB DINITRATE-HYDRALAZINE 20-37.5 MG PO TABS
1.0000 | ORAL_TABLET | Freq: Three times a day (TID) | ORAL | Status: DC
  Administered 2022-06-21 – 2022-06-23 (×7): 1 via ORAL
  Filled 2022-06-21 (×7): qty 1

## 2022-06-21 MED ORDER — POTASSIUM CHLORIDE CRYS ER 20 MEQ PO TBCR
40.0000 meq | EXTENDED_RELEASE_TABLET | Freq: Once | ORAL | Status: AC
  Administered 2022-06-21: 40 meq via ORAL
  Filled 2022-06-21: qty 2

## 2022-06-21 MED ORDER — FUROSEMIDE 10 MG/ML IJ SOLN
60.0000 mg | Freq: Two times a day (BID) | INTRAMUSCULAR | Status: DC
  Administered 2022-06-21 – 2022-06-23 (×5): 60 mg via INTRAVENOUS
  Filled 2022-06-21 (×5): qty 6

## 2022-06-21 MED ORDER — ACETAMINOPHEN 325 MG PO TABS
650.0000 mg | ORAL_TABLET | ORAL | Status: DC | PRN
  Administered 2022-06-21 – 2022-06-23 (×3): 650 mg via ORAL
  Filled 2022-06-21 (×4): qty 2

## 2022-06-21 MED ORDER — ONDANSETRON HCL 4 MG/2ML IJ SOLN
4.0000 mg | Freq: Four times a day (QID) | INTRAMUSCULAR | Status: DC | PRN
  Filled 2022-06-21: qty 2

## 2022-06-21 NOTE — Subjective & Objective (Signed)
CC: SOB HPI: [name] history of hypertension, chronic atrial fibrillation not on anticoagulation, obesity, he does not see a healthcare provider on a regular basis.  Presents the ER today with worsening dyspnea on exertion, orthopnea, PND for about a week now.  He is works as a Naval architect.  He has noticed worsening edema of his legs.  He has been sleeping in a recliner for the last several days.  He has been only taking his blood pressure medications intermittently.  He still taking prescriptions written in 2021.  Patient is only been taking his medications episodically.  Does not take them every day.  He has noted increasing dyspnea on exertion and PND.  He states that he can only lay flat for about 5 minutes at night before feeling like he is smothering and has to sit up.  He is not sleeping in a recliner.  No fevers.  Dry cough.  Has noted lower extremity edema mostly of his ankles and his feet.  He does not weigh himself every day.  Does not follow with a physician on a regular basis.  On arrival temp 97.4 heart rate 86 blood pressure 176/103 satting 99% on room air.  White count 4.3, hemoglobin 12.2, platelets of 159 Sodium 138, potassium 3.6, bicarb 21, BUN of 35, creatinine 1.87  BNP elevated at 561  Chest x-ray which I personally reviewed demonstrates cardiomegaly.  Mild pulmonary edema.  EKG which I personally reviewed shows rate controlled atrial fibrillation.  [facility] hospitalist contacted for admission.

## 2022-06-21 NOTE — Consult Note (Signed)
CARDIOLOGY CONSULT NOTE  Patient ID: Kolbe Delmonaco MRN: [id number] DOB/AGE: 63-Sep-1962 64 y.o.  Admit date: 06/20/2022 Referring Physician  Dr Jomarie Longs Primary Physician:  Pcp, No Reason for Consultation  AECHF, Afib  Patient ID: Darean Rote, male    DOB: 04/07/1960, 62 y.o.   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 He was found to have new diagnosis of atrial fibrillation with slow ventricular response and troponin elevation. Cath showed minimal nonobstructive CAD. Troponin elevation was thought to be type 2 MI in the setting of hypertensive urgency. Anticoagulation was not recommended at that time given low CHA2DS2VAsc score of 1, however now he is at least 2+ requiring Eliquis.   Patient presented to the ED with shortness of breath, orthopnea, and swelling in his legs and abdomen.  He states he has not come to the office in a few years because his insurance lapsed and he recently got it back so now he plans to follow-up with [place] in the office.  His orthopnea has improved since he has been receiving IV diuresis.  He does plan to follow-up with [place] closely once he is discharged.  He denies chest pain, palpitations, diaphoresis, syncope.  Past Medical History:  Diagnosis Date   A-fib (HCC)    Hypercholesteremia    Hypertension    Past Surgical History:  Procedure Laterality Date   LEFT HEART CATH AND CORONARY ANGIOGRAPHY N/A 02/12/2018   Procedure: LEFT HEART CATH AND CORONARY ANGIOGRAPHY;  Surgeon: Elder Negus, MD;  Location: [facility] INVASIVE CV LAB;  Service: Cardiovascular;  Laterality: N/A;   Social History   Tobacco Use   Smoking status: Never   Smokeless tobacco: Never  Substance Use Topics   Alcohol  use: Not Currently    Alcohol/week: 16.0 standard drinks of alcohol    Types: 16 Shots of liquor per week    Family History  Problem Relation Age of Onset   Diabetes Mother    CAD Brother     Marital Status: Legally Separated  ROS  Review of Systems  Cardiovascular:  Positive for leg swelling and orthopnea.  Respiratory:  Positive for shortness of breath.    Objective      06/21/2022    8:09 AM 06/21/2022    5:00 AM 06/21/2022    4:46 AM  Vitals with BMI  Weight  287 lbs 11 oz 287 lbs 14 oz  Systolic 173  157  Diastolic 99  72  Pulse 90  87    Blood pressure (!) 173/99, pulse 90, temperature 98.5 F (36.9 C), temperature source Oral, resp. rate 16, weight 130.5 kg, SpO2 96 %.    Physical Exam Vitals reviewed.  HENT:     Head: Normocephalic and atraumatic.  Cardiovascular:     Rate and Rhythm: Normal rate. Rhythm irregular.     Pulses: Normal pulses.     Heart sounds: Normal heart sounds. No murmur heard. Pulmonary:     Effort: Pulmonary effort is normal.     Breath sounds: Examination of the right-lower field reveals decreased breath sounds. Examination of the left-lower field reveals decreased breath sounds. Decreased breath sounds present.  Musculoskeletal:     Right lower leg: Edema present.  Left lower leg: Edema present.  Skin:    General: Skin is warm and dry.  Neurological:     Mental Status: He is alert.    Laboratory examination:   Recent Labs    06/20/22 1240  NA 138  K 3.6  CL 106  CO2 21*  GLUCOSE 111*  BUN 35*  CREATININE 1.87*  CALCIUM 8.5*  GFRNONAA 40*   CrCl cannot be calculated (Unknown ideal weight.).     Latest Ref Rng & Units 06/20/2022   12:40 PM 03/09/2018    5:14 PM 02/13/2018   12:29 PM  CMP  Glucose 70 - 99 mg/dL 161  90  98   BUN 8 - 23 mg/dL 35  26  18   Creatinine 0.61 - 1.24 mg/dL 0.96  0.45  4.09   Sodium 135 - 145 mmol/L 138  138  137   Potassium 3.5 - 5.1 mmol/L 3.6  3.5  3.9   Chloride 98 - 111 mmol/L 106   101  105   CO2 22 - 32 mmol/L 21  28  23    Calcium 8.9 - 10.3 mg/dL 8.5  9.5  9.2       Latest Ref Rng & Units 06/20/2022   12:40 PM 03/09/2018    5:14 PM 02/12/2018    2:09 AM  CBC  WBC 4.0 - 10.5 K/uL 4.3  5.3  6.7   Hemoglobin 13.0 - 17.0 g/dL 81.1  91.4  78.2   Hematocrit 39.0 - 52.0 % 40.1  41.9  42.4   Platelets 150 - 400 K/uL 159  130.0  145    Lipid Panel No results for input(s): "CHOL", "TRIG", "LDLCALC", "VLDL", "HDL", "CHOLHDL", "LDLDIRECT" in the last 8760 hours.  HEMOGLOBIN A1C Lab Results  Component Value Date   HGBA1C 6.1 (H) 06/21/2022   MPG 128.37 06/21/2022   TSH No results for input(s): "TSH" in the last 8760 hours. BNP (last 3 results) Recent Labs    06/20/22 1240  BNP 561.7*   Cardiac Panel (last 3 results) Recent Labs    06/20/22 1240 06/20/22 1510  TROPONINIHS 72* 68*     Medications and allergies  No Known Allergies   Current Meds  Medication Sig   amLODipine (NORVASC) 10 MG tablet TAKE 1 TABLET (10 MG TOTAL) BY MOUTH DAILY.   benazepril (LOTENSIN) 20 MG tablet TAKE 1 TABLET (20 MG TOTAL) BY MOUTH DAILY FOR 30 DAYS.   [DISCONTINUED] acetaminophen (TYLENOL) 325 MG tablet Take 2 tablets (650 mg total) by mouth every 6 (six) hours as needed.   [DISCONTINUED] cetirizine (ZYRTEC ALLERGY) 10 MG tablet Take 1 tablet (10 mg total) by mouth daily as needed (itching).   [DISCONTINUED] ibuprofen (ADVIL) 200 MG tablet Take 200 mg by mouth 2 (two) times daily. Uses for feet swelling   [DISCONTINUED] isosorbide-hydrALAZINE (BIDIL) 20-37.5 MG tablet Take 2 tablets by mouth 2 (two) times daily.    Scheduled Meds:  apixaban  5 mg Oral BID   empagliflozin  10 mg Oral Daily   furosemide  60 mg Intravenous Q12H   isosorbide-hydrALAZINE  1 tablet Oral TID   potassium chloride  20 mEq Oral Daily   sodium chloride flush  3 mL Intravenous Q12H   Continuous Infusions:  sodium chloride     PRN Meds:.sodium chloride, acetaminophen, ondansetron (ZOFRAN) IV,  sodium chloride flush   I/O last 3 completed shifts: In: 157.9 [P.O.:100; IV Piggyback:57.9] Out: -  Total I/O In: -  Out: 400 [Urine:400]  Net IO Since Admission: -242.11 mL [06/21/22 1101]   Radiology:   Imaging results have been reviewed and DG Chest 2 View  Result Date: 06/20/2022 CLINICAL DATA:  Shortness of breath EXAM: CHEST - 2 VIEW COMPARISON:  CXR 04/18/21 FINDINGS: Cardiomegaly. Small right pleural effusion. No pneumothorax. Possible retrocardiac opacity. There are prominent bilateral interstitial opacities, which may be related to mild pulmonary edema or atypical infection. No radiographically apparent displaced rib fractures. Visualized upper abdomen is unremarkable. Vertebral body heights are maintained. IMPRESSION: 1. Cardiomegaly with small right pleural effusion and mild pulmonary edema. 2. Possible retrocardiac opacity, which could represent atelectasis or pneumonia. Electronically Signed   By: Lorenza Cambridge M.D.   On: 06/20/2022 13:05    Cardiac Studies:   EKG 04/21/2018: Atrial fibrillation with controlled ventricular response. Anterolateral ST-elevation -repolarization variant.    Echocardiogram 02/12/2018: - Left ventricle: The cavity size was normal. There was moderate   concentric hypertrophy. Systolic function was mildly reduced. The   estimated ejection fraction was in the range of 45% to 50%. Wall   motion was normal; there were no regional wall motion   abnormalities. Unable to evaluate diastolic function due to   atrial fibrillation. - Mitral valve: Moderately dilated annulus. There was mild   regurgitation. - Left atrium: The atrium was severely dilated. - Right ventricle: Systolic function was low normal. - Right atrium: The atrium was mildly dilated. - No evidence of pulmonary hypertension.   Cath 02/12/2018: LM: Distal 20% disease LAD: Ostial 20% disease Ramus: No significant disease LCx: Dominant. No significant disease RCA: Nondominant. No  significant disease LVEDP normal Conclusion: Mild nonobstructive coronary artery disease Type 2 MI in the setting of hypertensive urgency.   EKG: 06/20/2022: Atrial fibrillation with CVR. Criteria for left ventricular hypertrophy met. Borderline prolonged QT interval. No evidence of ischemia.  Assessment & Recommendations   Afib CVR Amio gtt and bolus ordered Continue diuresis, will keep on IV throughout weekend and likely switch to PO Monday morning Added metoprolol 12.5 mg BID Currently on Eliquis Will not opt for DCCV as he has not been anticoagulated long enough and he may convert on amio. Discussed with primary team.   Acute on chronic combined systolic and diastolic heart failure, currently decompensated Continue IV diuresis Patient is on GDMT Echocardiogram has been ordered. He lost his insurance and was lost to follow-up but now he has it back and plans to follow closely with [place] in office   Hypertension: Remains suboptimal.  Continue current medications, up-titrate as HR allows.     Clotilde Dieter, DO 06/21/2022, 11:01 AM Office: [phone]

## 2022-06-21 NOTE — Assessment & Plan Note (Signed)
Last Scr was 1.25 in 01/2020.  Today, Scr 1.87. unclear if this is new or old. Hold on ACEI/ARB for now until echo results.

## 2022-06-21 NOTE — H&P (Signed)
History and Physical    James [name] ZOX:[id number] DOB: 03/25/1960 DOA: 06/20/2022  DOS: the patient was seen and examined on 06/20/2022  PCP: Pcp, No   Patient coming from: Home  I have personally briefly reviewed patient's old medical records in [place] [facility]  CC: SOB HPI: [name] history of hypertension, chronic atrial fibrillation not on anticoagulation, obesity, he does not see a healthcare provider on a regular basis.  Presents the ER today with worsening dyspnea on exertion, orthopnea, PND for about a week now.  He is works as a Naval architect.  He has noticed worsening edema of his legs.  He has been sleeping in a recliner for the last several days.  He has been only taking his blood pressure medications intermittently.  He still taking prescriptions written in 2021.  Patient is only been taking his medications episodically.  Does not take them every day.  He has noted increasing dyspnea on exertion and PND.  He states that he can only lay flat for about 5 minutes at night before feeling like he is smothering and has to sit up.  He is not sleeping in a recliner.  No fevers.  Dry cough.  Has noted lower extremity edema mostly of his ankles and his feet.  He does not weigh himself every day.  Does not follow with a physician on a regular basis.  On arrival temp 97.4 heart rate 86 blood pressure 176/103 satting 99% on room air.  White count 4.3, hemoglobin 12.2, platelets of 159 Sodium 138, potassium 3.6, bicarb 21, BUN of 35, creatinine 1.87  BNP elevated at 561  Chest x-ray which I personally reviewed demonstrates cardiomegaly.  Mild pulmonary edema.  EKG which I personally reviewed shows rate controlled atrial fibrillation.  [facility] hospitalist contacted for admission.    ED Course: BNP 561, EKG afib, cxr pulmonary edema  Review of Systems:  Review of Systems  Constitutional:  Negative for chills and fever.  HENT: Negative.     Eyes: Negative.   Respiratory:  Positive for cough and shortness of breath.   Cardiovascular:  Positive for orthopnea, leg swelling and PND.       Sleeping in a recliner. SOB even with 2 pillows at night  Gastrointestinal: Negative.   Genitourinary: Negative.   Musculoskeletal: Negative.   Skin: Negative.   Neurological: Negative.   Endo/Heme/Allergies: Negative.   Psychiatric/Behavioral:  Positive for substance abuse.   All other systems reviewed and are negative.   Past Medical History:  Diagnosis Date   A-fib (HCC)    Hypercholesteremia    Hypertension     Past Surgical History:  Procedure Laterality Date   LEFT HEART CATH AND CORONARY ANGIOGRAPHY N/A 02/12/2018   Procedure: LEFT HEART CATH AND CORONARY ANGIOGRAPHY;  Surgeon: Elder Negus, MD;  Location: [facility];  Service: Cardiovascular;  Laterality: N/A;     reports that he has never smoked. He has never used smokeless tobacco. He reports that he does not currently use alcohol after a past usage of about 16.0 standard drinks of alcohol per week. He reports current drug use. Drug: Cocaine.  No Known Allergies  Family History  Problem Relation Age of Onset   Diabetes Mother    CAD Brother     Prior to Admission medications   Medication Sig Start Date End Date Taking? Authorizing Provider  acetaminophen (TYLENOL) 325 MG tablet Take 2 tablets (650 mg total) by mouth every 6 (six) hours as  needed. 01/09/21  Yes Wallis Bamberg, PA-C  amLODipine (NORVASC) 10 MG tablet TAKE 1 TABLET (10 MG TOTAL) BY MOUTH DAILY. 03/16/21 06/20/22 Yes Harris, Abigail, PA-C  benazepril (LOTENSIN) 20 MG tablet TAKE 1 TABLET (20 MG TOTAL) BY MOUTH DAILY FOR 30 DAYS. 03/16/21 06/20/22 Yes Harris, Abigail, PA-C  cetirizine (ZYRTEC ALLERGY) 10 MG tablet Take 1 tablet (10 mg total) by mouth daily as needed (itching). 05/06/22  Yes Hermanns, Ashlee P, PA-C  ibuprofen (ADVIL) 200 MG tablet Take 200 mg by mouth 2 (two) times daily. Uses for  feet swelling   Yes [provider]  isosorbide-hydrALAZINE (BIDIL) 20-37.5 MG tablet Take 2 tablets by mouth 2 (two) times daily. 03/16/21  Yes Harris, Abigail, PA-C  atorvastatin (LIPITOR) 20 MG tablet Take 1 tablet (20 mg total) by mouth daily. Patient not taking: Reported on 06/20/2022 08/31/19   Elder Negus, MD  nystatin ointment (MYCOSTATIN) Apply 1 Application topically 2 (two) times daily as needed. Patient not taking: Reported on 06/20/2022 05/06/22   Cynda Acres, PA-C    Physical Exam: Vitals:   06/20/22 1830 06/20/22 2003 06/20/22 2255 06/21/22 0036  BP:  (!) 189/97 (!) 169/103   Pulse: 76 88 93   Resp: (!) 23 19 20    Temp:    99.1 F (37.3 C)  TempSrc:    Oral  SpO2: 100% 100% 100%     Physical Exam Vitals and nursing note reviewed.  Constitutional:      General: He is not in acute distress.    Appearance: He is obese. He is not toxic-appearing.  HENT:     Head: Normocephalic and atraumatic.     Nose: Nose normal.  Eyes:     General: No scleral icterus. Cardiovascular:     Rate and Rhythm: Normal rate. Rhythm irregular.     Pulses: Normal pulses.  Pulmonary:     Effort: Pulmonary effort is normal.     Breath sounds: Examination of the right-lower field reveals rales. Examination of the left-lower field reveals rales. Rales present.  Abdominal:     General: Abdomen is protuberant. Bowel sounds are normal. There is no distension.     Palpations: Abdomen is soft.     Tenderness: There is no abdominal tenderness.  Musculoskeletal:     Right lower leg: 1+ Edema present.     Left lower leg: 1+ Edema present.     Comments: +1 bilateral pitting pretibial edema  Skin:    General: Skin is warm and dry.     Capillary Refill: Capillary refill takes less than 2 seconds.  Neurological:     General: No focal deficit present.     Mental Status: He is alert and oriented to person, place, and time.      Labs on Admission: I have personally reviewed  following labs and imaging studies  CBC: Recent Labs  Lab 06/20/22 1240  WBC 4.3  HGB 12.2*  HCT 40.1  MCV 85.0  PLT 159   Basic Metabolic Panel: Recent Labs  Lab 06/20/22 1240  NA 138  K 3.6  CL 106  CO2 21*  GLUCOSE 111*  BUN 35*  CREATININE 1.87*  CALCIUM 8.5*   GFR: CrCl cannot be calculated (Unknown ideal weight.).  Cardiac Enzymes: Recent Labs  Lab 06/20/22 1240 06/20/22 1510  TROPONINIHS 72* 68*   BNP (last 3 results) Recent Labs    06/20/22 1240  BNP 561.7*    Radiological Exams on Admission: I have personally reviewed images  DG Chest 2 View  Result Date: 06/20/2022 CLINICAL DATA:  Shortness of breath EXAM: CHEST - 2 VIEW COMPARISON:  CXR 04/18/21 FINDINGS: Cardiomegaly. Small right pleural effusion. No pneumothorax. Possible retrocardiac opacity. There are prominent bilateral interstitial opacities, which may be related to mild pulmonary edema or atypical infection. No radiographically apparent displaced rib fractures. Visualized upper abdomen is unremarkable. Vertebral body heights are maintained. IMPRESSION: 1. Cardiomegaly with small right pleural effusion and mild pulmonary edema. 2. Possible retrocardiac opacity, which could represent atelectasis or pneumonia. Electronically Signed   By: Lorenza Cambridge M.D.   On: 06/20/2022 13:05    EKG: My personal interpretation of EKG shows: afib    Assessment/Plan Principal Problem:   Acute systolic CHF (congestive heart failure) (HCC) Active Problems:   Essential hypertension   Chronic atrial fibrillation (HCC)   CKD stage 3a, GFR 45-59 ml/min (HCC)   Obesity (BMI 30-39.9)   Cocaine use    Assessment and Plan: * Acute systolic CHF (congestive heart failure) (HCC) Admit to cardiac telemetry. Start diuresis with IV lasix 60 mg q12h. Check echo. Hold on betablocker due to acute CHF. Pt will need new PCP. Hold ARB/ACEI until echo. May need entresto. Pt is a long distance truck driver. Last EF in 01/2018 was  45-50%.  Patient does not have pneumonia.  Antibiotics discontinued.  CKD stage 3a, GFR 45-59 ml/min (HCC) Last Scr was 1.25 in 01/2020.  Today, Scr 1.87. unclear if this is new or old. Hold on ACEI/ARB for now until echo results.  Chronic atrial fibrillation (HCC) Chronic afib. Not on systemic anticoagulation. Has been [facility] in the past. [place] VASC score of 2. Hold on anticoagulation for now. Keep serum K > 4.0 and Mg >2.0  Essential hypertension Pt still taking meds from 2021. Does not see healthcare provider on a regular basis. 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  Cocaine use Admits to cocaine use about 1 month ago prior to going to music concert with his friends. States he does not use cocaine often. Check UDS. Hold on betablockers.  Obesity (BMI 30-39.9) Chronic.   DVT prophylaxis: SQ Heparin Code Status: Full Code Family Communication: no family at bedside  Disposition Plan: return home  Consults called: none  Admission status: Inpatient, Telemetry bed   Carollee Herter, DO [facility] 06/21/2022, 12:44 AM

## 2022-06-21 NOTE — Assessment & Plan Note (Signed)
Pt still taking meds from 2021. Does not see healthcare provider on a regular basis. 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

## 2022-06-21 NOTE — Progress Notes (Signed)
PROGRESS NOTE    James [name]  ZOX:[id number] DOB: 25-Oct-1960 DOA: 06/20/2022 PCP: Pcp, No  61/M with history of hypertension, chronic systolic CHF, NICM, chronic A-fib not on anticoagulation, obesity presented to the ED with worsening dyspnea, orthopnea and lower extremity edema.  Has been taking some of his blood pressure medicines intermittently, has not seen a provider in 3+ years -In the ER hypertensive, hemoglobin 12.2, creatinine 1.8, BNP 561, chest x-ray with cardiomegaly and mild pulmonary edema, EKG noted A-fib   Subjective: -Breathing starting to improve, not back to baseline  Assessment and Plan:  Acute on chronic systolic CHF -Echo in 2019 noted EF of 45%, mild mitral regurgitation -Cath 2019 noted mild nonobstructive CAD -Lost to follow-up -Continue IV Lasix today, follow-up repeat echo -Add Jardiance, continue BiDil -He is also in A-fib, will request cardiology input  CKD stage 3a, GFR 45-59 ml/min (HCC) Last Scr was 1.25 in 01/2020.   -No recent baseline, hold ACE inhibitor -Monitor with diuresis  P. atrial fibrillation (HCC) Chronic afib. Not on systemic anticoagulation. Has been [facility] in the past.  -Start Eliquis -Cards to decide rhythm versus rate control strategy  Essential hypertension -As above, needs PCP  Cocaine use Admits to cocaine use about 1 month ago prior to going to music concert with his friends. States he does not use cocaine often.  -UDS negative  Obesity (BMI 30-39.9) Chronic.   DVT prophylaxis: Add apixaban Code Status: Full code Family Communication: None present Disposition Plan:   Consultants: Cards   Procedures:   Antimicrobials:    Objective: Vitals:   06/21/22 0227 06/21/22 0446 06/21/22 0500 06/21/22 0809  BP:  (!) 157/72  (!) 173/99  Pulse:  87  90  Resp:  (!) 22  16  Temp:  98.4 F (36.9 C)  98.5 F (36.9 C)  TempSrc:  Oral  Oral  SpO2: 96% 96%    Weight:  130.6 kg 130.5 kg      Intake/Output Summary (Last 24 hours) at 06/21/2022 1046 Last data filed at 06/21/2022 8119 Gross per 24 hour  Intake 157.89 ml  Output 400 ml  Net -242.11 ml   Filed Weights   06/21/22 0446 06/21/22 0500  Weight: 130.6 kg 130.5 kg    Examination:  General exam: Appears calm and comfortable  HEENT: Positive JVD CVS: S1-S2, irregular rhythm Lungs: Decreased breath sounds to bases Abdomen: Soft, obese, nontender, bowel sounds present Extremities: 1-2+ edema Skin: No rashes Psychiatry:  Mood & affect appropriate.     Data Reviewed:   CBC: Recent Labs  Lab 06/20/22 1240  WBC 4.3  HGB 12.2*  HCT 40.1  MCV 85.0  PLT 159   Basic Metabolic Panel: Recent Labs  Lab 06/20/22 1240  NA 138  K 3.6  CL 106  CO2 21*  GLUCOSE 111*  BUN 35*  CREATININE 1.87*  CALCIUM 8.5*   GFR: CrCl cannot be calculated (Unknown ideal weight.). Liver Function Tests: No results for input(s): "AST", "ALT", "ALKPHOS", "BILITOT", "PROT", "ALBUMIN" in the last 168 hours. No results for input(s): "LIPASE", "AMYLASE" in the last 168 hours. No results for input(s): "AMMONIA" in the last 168 hours. Coagulation Profile: No results for input(s): "INR", "PROTIME" in the last 168 hours. Cardiac Enzymes: No results for input(s): "CKTOTAL", "CKMB", "CKMBINDEX", "TROPONINI" in the last 168 hours. BNP (last 3 results) No results for input(s): "PROBNP" in the last 8760 hours. HbA1C: Recent Labs    06/21/22 0427  HGBA1C 6.1*   CBG: No  results for input(s): "GLUCAP" in the last 168 hours. Lipid Profile: No results for input(s): "CHOL", "HDL", "LDLCALC", "TRIG", "CHOLHDL", "LDLDIRECT" in the last 72 hours. Thyroid Function Tests: No results for input(s): "TSH", "T4TOTAL", "FREET4", "T3FREE", "THYROIDAB" in the last 72 hours. Anemia Panel: No results for input(s): "VITAMINB12", "FOLATE", "FERRITIN", "TIBC", "IRON", "RETICCTPCT" in the last 72 hours. Urine analysis:    Component Value  Date/Time   COLORURINE YELLOW 06/21/2022 0146   APPEARANCEUR HAZY (A) 06/21/2022 0146   LABSPEC 1.006 06/21/2022 0146   PHURINE 5.0 06/21/2022 0146   GLUCOSEU NEGATIVE 06/21/2022 0146   HGBUR LARGE (A) 06/21/2022 0146   BILIRUBINUR NEGATIVE 06/21/2022 0146   KETONESUR NEGATIVE 06/21/2022 0146   PROTEINUR NEGATIVE 06/21/2022 0146   NITRITE NEGATIVE 06/21/2022 0146   LEUKOCYTESUR SMALL (A) 06/21/2022 0146   Sepsis Labs: @LABRCNTIP (procalcitonin:4,lacticidven:4)  )No results found for this or any previous visit (from the past 240 hour(s)).   Radiology Studies: DG Chest 2 View  Result Date: 06/20/2022 CLINICAL DATA:  Shortness of breath EXAM: CHEST - 2 VIEW COMPARISON:  CXR 04/18/21 FINDINGS: Cardiomegaly. Small right pleural effusion. No pneumothorax. Possible retrocardiac opacity. There are prominent bilateral interstitial opacities, which may be related to mild pulmonary edema or atypical infection. No radiographically apparent displaced rib fractures. Visualized upper abdomen is unremarkable. Vertebral body heights are maintained. IMPRESSION: 1. Cardiomegaly with small right pleural effusion and mild pulmonary edema. 2. Possible retrocardiac opacity, which could represent atelectasis or pneumonia. Electronically Signed   By: Lorenza Cambridge M.D.   On: 06/20/2022 13:05     Scheduled Meds:  apixaban  5 mg Oral BID   furosemide  60 mg Intravenous Q12H   isosorbide-hydrALAZINE  1 tablet Oral TID   potassium chloride  20 mEq Oral Daily   sodium chloride flush  3 mL Intravenous Q12H   Continuous Infusions:  sodium chloride       LOS: 0 days    Time spent:   Zannie Cove, MD [facility]   06/21/2022, 10:46 AM

## 2022-06-21 NOTE — ED Notes (Signed)
Report called to unit RN Nicola Girt

## 2022-06-21 NOTE — ED Notes (Signed)
ED TO INPATIENT HANDOFF REPORT  ED Nurse Name and Phone #:  Gillis Ends #[phone]  S Name/Age/Gender James [name] 62 y.o. male Room/Bed: 018C/018C  Code Status   Code Status: Full Code  Home/SNF/Other Home Patient oriented to: self, place, time, and situation Is this baseline? Yes   Triage Complete: Triage complete  Chief Complaint Acute systolic CHF (congestive heart failure) (HCC) [I50.21]  Triage Note Pt came in via POV d/t SOB & chest congestion (per pt) that has been bothering him since last Friday (06/13/22). States that has to sit up to be able to breath & cannot tolerate laying flat. Reports that he has not been taking his "fluid pills" as he should & unknown how many doses he has missed.    Allergies No Known Allergies  Level of Care/Admitting Diagnosis ED Disposition     ED Disposition  Admit   Condition  --   Comment  Hospital Area: [facility] [[id number]]  Level of Care: Telemetry Cardiac [[id number]]  May admit patient to [facility] or [facility] if equivalent level of care is available:: No  Covid Evaluation: Asymptomatic - no recent exposure (last 10 days) testing not required  Diagnosis: Acute systolic CHF (congestive heart failure) [facility]) [[id number]]  Admitting Physician: Imogene Burn ERIC [[id number]]  Attending Physician: Imogene Burn, ERIC [[id number]]  Certification:: I certify this patient will need inpatient services for at least 2 midnights  Estimated Length of Stay: 3          B Medical/Surgery History Past Medical History:  Diagnosis Date   A-fib (HCC)    Hypercholesteremia    Hypertension    Past Surgical History:  Procedure Laterality Date   LEFT HEART CATH AND CORONARY ANGIOGRAPHY N/A 02/12/2018   Procedure: LEFT HEART CATH AND CORONARY ANGIOGRAPHY;  Surgeon: Elder Negus, MD;  Location: [facility] INVASIVE CV LAB;  Service: Cardiovascular;  Laterality: N/A;     A IV Location/Drains/Wounds Patient Lines/Drains/Airways Status     Active  Line/Drains/Airways     Name Placement date Placement time Site Days   Peripheral IV 06/20/22 20 G 1" Anterior;Distal;Left;Upper Arm 06/20/22  1246  Arm  1            Intake/Output Last 24 hours  Intake/Output Summary (Last 24 hours) at 06/21/2022 0118 Last data filed at 06/21/2022 0022 Gross per 24 hour  Intake 47.03 ml  Output --  Net 47.03 ml    Labs/Imaging Results for orders placed or performed during the hospital encounter of 06/20/22 (from the past 48 hour(s))  Basic metabolic panel     Status: Abnormal   Collection Time: 06/20/22 12:40 PM  Result Value Ref Range   Sodium 138 135 - 145 mmol/L   Potassium 3.6 3.5 - 5.1 mmol/L   Chloride 106 98 - 111 mmol/L   CO2 21 (L) 22 - 32 mmol/L   Glucose, Bld 111 (H) 70 - 99 mg/dL    Comment: Glucose reference range applies only to samples taken after fasting for at least 8 hours.   BUN 35 (H) 8 - 23 mg/dL   Creatinine, Ser 0.98 (H) 0.61 - 1.24 mg/dL   Calcium 8.5 (L) 8.9 - 10.3 mg/dL   GFR, Estimated 40 (L) >60 mL/min    Comment: (NOTE) Calculated using the CKD-EPI Creatinine Equation (2021)    Anion gap 11 5 - 15    Comment: Performed at [facility], [address] [place], [place] [address]  CBC  Status: Abnormal   Collection Time: 06/20/22 12:40 PM  Result Value Ref Range   WBC 4.3 4.0 - 10.5 K/uL   RBC 4.72 4.22 - 5.81 MIL/uL   Hemoglobin 12.2 (L) 13.0 - 17.0 g/dL   HCT 52.8 41.3 - 24.4 %   MCV 85.0 80.0 - 100.0 fL   MCH 25.8 (L) 26.0 - 34.0 pg   MCHC 30.4 30.0 - 36.0 g/dL   RDW 01.0 (H) 27.2 - 53.6 %   Platelets 159 150 - 400 K/uL   nRBC 0.0 0.0 - 0.2 %    Comment: Performed at [facility], [address], [place], [place] [address]  Troponin I (High Sensitivity)     Status: Abnormal   Collection Time: 06/20/22 12:40 PM  Result Value Ref Range   Troponin I (High Sensitivity) 72 (H) <18 ng/L    Comment: (NOTE) Elevated high sensitivity troponin I (hsTnI) values and significant  changes  across serial measurements may suggest ACS but many other  chronic and acute conditions are known to elevate hsTnI results.  Refer to the "Links" section for chest pain algorithms and additional  guidance. Performed at [facility], [address], [place], [place] [address]   Brain natriuretic peptide     Status: Abnormal   Collection Time: 06/20/22 12:40 PM  Result Value Ref Range   B Natriuretic Peptide 561.7 (H) 0.0 - 100.0 pg/mL    Comment: Performed at [facility], [address], [place], [place] [address]  Troponin I (High Sensitivity)     Status: Abnormal   Collection Time: 06/20/22  3:10 PM  Result Value Ref Range   Troponin I (High Sensitivity) 68 (H) <18 ng/L    Comment: (NOTE) Elevated high sensitivity troponin I (hsTnI) values and significant  changes across serial measurements may suggest ACS but many other  chronic and acute conditions are known to elevate hsTnI results.  Refer to the "Links" section for chest pain algorithms and additional  guidance. Performed at [facility], [address], [place], [place] [address]    DG Chest 2 View  Result Date: 06/20/2022 CLINICAL DATA:  Shortness of breath EXAM: CHEST - 2 VIEW COMPARISON:  CXR 04/18/21 FINDINGS: Cardiomegaly. Small right pleural effusion. No pneumothorax. Possible retrocardiac opacity. There are prominent bilateral interstitial opacities, which may be related to mild pulmonary edema or atypical infection. No radiographically apparent displaced rib fractures. Visualized upper abdomen is unremarkable. Vertebral body heights are maintained. IMPRESSION: 1. Cardiomegaly with small right pleural effusion and mild pulmonary edema. 2. Possible retrocardiac opacity, which could represent atelectasis or pneumonia. Electronically Signed   By: Lorenza Cambridge M.D.   On: 06/20/2022 13:05    Pending Labs Unresulted Labs (From admission, onward)     Start     Ordered   06/21/22 0012  Urinalysis, Routine w  reflex microscopic -Urine, Clean Catch  Once,   URGENT       Question:  Specimen Source  Answer:  Urine, Clean Catch   06/21/22 0011   06/21/22 0011  Rapid urine drug screen (hospital performed)  ONCE - STAT,   STAT        06/21/22 0010   Signed and Held  HIV Antibody (routine testing w rflx)  (HIV Antibody (Routine testing w reflex) panel)  Add-on,   R        Signed and Held   Signed and Held  Hemoglobin A1c  Add-on,   R  Signed and Held            Vitals/Pain Today's Vitals   06/20/22 1830 06/20/22 2003 06/20/22 2255 06/21/22 0036  BP:  (!) 189/97 (!) 169/103   Pulse: 76 88 93   Resp: (!) 23 19 20    Temp:    99.1 F (37.3 C)  TempSrc:    Oral  SpO2: 100% 100% 100%   PainSc:  0-No pain      Isolation Precautions No active isolations  Medications Medications  potassium chloride SA (KLOR-CON M) CR tablet 40 mEq (has no administration in time range)  amLODipine (NORVASC) tablet 10 mg (10 mg Oral Given 06/20/22 2102)  furosemide (LASIX) injection 40 mg (40 mg Intravenous Given 06/20/22 2103)  ipratropium-albuterol (DUONEB) 0.5-2.5 (3) MG/3ML nebulizer solution 3 mL (3 mLs Nebulization Given 06/20/22 2310)    Mobility walks     Focused Assessments     R Recommendations: See Admitting Provider Note  Report given to:   Additional Notes:

## 2022-06-21 NOTE — Assessment & Plan Note (Signed)
Chronic. 

## 2022-06-21 NOTE — Assessment & Plan Note (Signed)
Admits to cocaine use about 1 month ago prior to going to music concert with his friends. States he does not use cocaine often. Drug screen on admission negative.

## 2022-06-21 NOTE — Assessment & Plan Note (Deleted)
Chronic afib. Not on systemic anticoagulation. Has been [facility] in the past. [place] VASC score of 2. Hold on anticoagulation for now. Keep serum K > 4.0 and Mg >2.0

## 2022-06-21 NOTE — Assessment & Plan Note (Addendum)
Echocardiogram with mild reduction in systolic function 45 to 50% (not systolic heart failure), global hypokinesis, RV with mild enlargement, RA with moderate dilatation, no significant valvular disease.   Patient was placed on IV furosemide for diuresis, negative fluid balance was achieved, -4,187 ml, with significant improvement in his symptoms.   Patient will continue diuresis with oral furosemide, afterload reduction with Bidil, and SGLT 2 inh with empagliflozin.   Holding on RAAS inhibition due to reduced GFR.  No Beta blockade due to bradycardia.  Follow up with cardiology as outpatient.

## 2022-06-21 NOTE — Plan of Care (Signed)

## 2022-06-22 ENCOUNTER — Inpatient Hospital Stay (HOSPITAL_COMMUNITY): Payer: BLUE CROSS/BLUE SHIELD

## 2022-06-22 DIAGNOSIS — I5021 Acute systolic (congestive) heart failure: Secondary | ICD-10-CM | POA: Diagnosis not present

## 2022-06-22 LAB — CBC
HCT: 30.7 % — ABNORMAL LOW (ref 39.0–52.0)
Hemoglobin: 10.1 g/dL — ABNORMAL LOW (ref 13.0–17.0)
MCH: 26.6 pg (ref 26.0–34.0)
MCHC: 32.9 g/dL (ref 30.0–36.0)
MCV: 80.8 fL (ref 80.0–100.0)
Platelets: 154 10*3/uL (ref 150–400)
RBC: 3.8 MIL/uL — ABNORMAL LOW (ref 4.22–5.81)
RDW: 16.3 % — ABNORMAL HIGH (ref 11.5–15.5)
WBC: 5.8 10*3/uL (ref 4.0–10.5)
nRBC: 0 % (ref 0.0–0.2)

## 2022-06-22 LAB — COMPREHENSIVE METABOLIC PANEL
ALT: 20 U/L (ref 0–44)
AST: 20 U/L (ref 15–41)
Albumin: 2.7 g/dL — ABNORMAL LOW (ref 3.5–5.0)
Alkaline Phosphatase: 58 U/L (ref 38–126)
Anion gap: 8 (ref 5–15)
BUN: 36 mg/dL — ABNORMAL HIGH (ref 8–23)
CO2: 24 mmol/L (ref 22–32)
Calcium: 8.2 mg/dL — ABNORMAL LOW (ref 8.9–10.3)
Chloride: 104 mmol/L (ref 98–111)
Creatinine, Ser: 2.19 mg/dL — ABNORMAL HIGH (ref 0.61–1.24)
GFR, Estimated: 33 mL/min — ABNORMAL LOW (ref >60)
Glucose, Bld: 103 mg/dL — ABNORMAL HIGH (ref 70–99)
Potassium: 4.1 mmol/L (ref 3.5–5.1)
Sodium: 136 mmol/L (ref 135–145)
Total Bilirubin: 0.9 mg/dL (ref 0.3–1.2)
Total Protein: 7.2 g/dL (ref 6.5–8.1)

## 2022-06-22 LAB — ECHOCARDIOGRAM COMPLETE

## 2022-06-22 MED ORDER — FUROSEMIDE 10 MG/ML IJ SOLN
60.0000 mg | Freq: Once | INTRAMUSCULAR | Status: AC
  Administered 2022-06-22: 60 mg via INTRAVENOUS
  Filled 2022-06-22: qty 6

## 2022-06-22 NOTE — Progress Notes (Signed)
PROGRESS NOTE    James [name]  AVW:[id number] DOB: Apr 29, 1960 DOA: 06/20/2022 PCP: Pcp, No  61/M with history of hypertension, chronic systolic CHF, NICM, chronic A-fib not on anticoagulation, obesity presented to the ED with worsening dyspnea, orthopnea and lower extremity edema.  Has been taking some of his blood pressure medicines intermittently, has not seen a provider in 3+ years -In the ER hypertensive, hemoglobin 12.2, creatinine 1.8, BNP 561, chest x-ray with cardiomegaly and mild pulmonary edema, EKG noted A-fib   Subjective: -Still swollen, breathing slowly improving  Assessment and Plan:  Acute on chronic systolic CHF -Echo in 2019 noted EF of 45%, mild mitral regurgitation -Cath 2019 noted mild nonobstructive CAD -Lost to follow-up -Continue IV Lasix, at least 1.3 L negative, urine output inaccurate, continue Jardiance, BiDil -Appreciate cardiology input  CKD stage 3a, GFR 45-59 ml/min (HCC) Last Scr was 1.25 in 01/2020.   -No recent baseline, hold ACE inhibitor -Mild bump in creatinine but hypervolemic, monitor with diuresis  P. atrial fibrillation (HCC) Chronic afib. Not on systemic anticoagulation. Has been [facility] in the past.  -Continue Eliquis -Cards following, now on amiodarone, plan for TEE with DCCV if he does not convert  Essential hypertension -As above, needs PCP  Cocaine use Admits to cocaine use about 1 month ago prior to going to music concert with his friends. States he does not use cocaine often.  -UDS negative  Obesity (BMI 30-39.9) Chronic.   DVT prophylaxis: apixaban Code Status: Full code Family Communication: None present Disposition Plan:   Consultants: Cards   Procedures:   Antimicrobials:    Objective: Vitals:   06/21/22 2314 06/22/22 0148 06/22/22 0500 06/22/22 0756  BP: (!) 147/86 (!) 142/81  (!) 159/88  Pulse: 75 66  60  Resp: 20 20  18   Temp:    98 F (36.7 C)  TempSrc:    Oral  SpO2: 96%  93%  95%  Weight:   130.7 kg     Intake/Output Summary (Last 24 hours) at 06/22/2022 1129 Last data filed at 06/22/2022 8295 Gross per 24 hour  Intake 891.71 ml  Output 1600 ml  Net -708.29 ml   Filed Weights   06/21/22 0446 06/21/22 0500 06/22/22 0500  Weight: 130.6 kg 130.5 kg 130.7 kg    Examination:  General exam: AAOx3, no distress HEENT: Positive JVD CVS: S1-S2, irregular rhythm Lungs: Clear bilaterally Abdomen: Soft, nontender, bowel sounds present Extremities: 1+ edema  Skin: No rashes Psychiatry:  Mood & affect appropriate.     Data Reviewed:   CBC: Recent Labs  Lab 06/20/22 1240 06/22/22 0039  WBC 4.3 5.8  HGB 12.2* 10.1*  HCT 40.1 30.7*  MCV 85.0 80.8  PLT 159 154   Basic Metabolic Panel: Recent Labs  Lab 06/20/22 1240 06/22/22 0039  NA 138 136  K 3.6 4.1  CL 106 104  CO2 21* 24  GLUCOSE 111* 103*  BUN 35* 36*  CREATININE 1.87* 2.19*  CALCIUM 8.5* 8.2*   GFR: CrCl cannot be calculated (Unknown ideal weight.). Liver Function Tests: Recent Labs  Lab 06/22/22 0039  AST 20  ALT 20  ALKPHOS 58  BILITOT 0.9  PROT 7.2  ALBUMIN 2.7*   No results for input(s): "LIPASE", "AMYLASE" in the last 168 hours. No results for input(s): "AMMONIA" in the last 168 hours. Coagulation Profile: No results for input(s): "INR", "PROTIME" in the last 168 hours. Cardiac Enzymes: No results for input(s): "CKTOTAL", "CKMB", "CKMBINDEX", "TROPONINI" in the last 168 hours.  BNP (last 3 results) No results for input(s): "PROBNP" in the last 8760 hours. HbA1C: Recent Labs    06/21/22 0427  HGBA1C 6.1*   CBG: No results for input(s): "GLUCAP" in the last 168 hours. Lipid Profile: No results for input(s): "CHOL", "HDL", "LDLCALC", "TRIG", "CHOLHDL", "LDLDIRECT" in the last 72 hours. Thyroid Function Tests: No results for input(s): "TSH", "T4TOTAL", "FREET4", "T3FREE", "THYROIDAB" in the last 72 hours. Anemia Panel: No results for input(s): "VITAMINB12",  "FOLATE", "FERRITIN", "TIBC", "IRON", "RETICCTPCT" in the last 72 hours. Urine analysis:    Component Value Date/Time   COLORURINE YELLOW 06/21/2022 0146   APPEARANCEUR HAZY (A) 06/21/2022 0146   LABSPEC 1.006 06/21/2022 0146   PHURINE 5.0 06/21/2022 0146   GLUCOSEU NEGATIVE 06/21/2022 0146   HGBUR LARGE (A) 06/21/2022 0146   BILIRUBINUR NEGATIVE 06/21/2022 0146   KETONESUR NEGATIVE 06/21/2022 0146   PROTEINUR NEGATIVE 06/21/2022 0146   NITRITE NEGATIVE 06/21/2022 0146   LEUKOCYTESUR SMALL (A) 06/21/2022 0146   Sepsis Labs: @LABRCNTIP (procalcitonin:4,lacticidven:4)  )No results found for this or any previous visit (from the past 240 hour(s)).   Radiology Studies: DG Chest 2 View  Result Date: 06/20/2022 CLINICAL DATA:  Shortness of breath EXAM: CHEST - 2 VIEW COMPARISON:  CXR 04/18/21 FINDINGS: Cardiomegaly. Small right pleural effusion. No pneumothorax. Possible retrocardiac opacity. There are prominent bilateral interstitial opacities, which may be related to mild pulmonary edema or atypical infection. No radiographically apparent displaced rib fractures. Visualized upper abdomen is unremarkable. Vertebral body heights are maintained. IMPRESSION: 1. Cardiomegaly with small right pleural effusion and mild pulmonary edema. 2. Possible retrocardiac opacity, which could represent atelectasis or pneumonia. Electronically Signed   By: Lorenza Cambridge M.D.   On: 06/20/2022 13:05     Scheduled Meds:  apixaban  5 mg Oral BID   empagliflozin  10 mg Oral Daily   furosemide  60 mg Intravenous Q12H   furosemide  60 mg Intravenous Once   guaiFENesin  600 mg Oral BID   isosorbide-hydrALAZINE  1 tablet Oral TID   metoprolol tartrate  12.5 mg Oral BID   potassium chloride  20 mEq Oral Daily   sodium chloride flush  3 mL Intravenous Q12H   Continuous Infusions:  sodium chloride     amiodarone 30 mg/hr (06/22/22 0907)     LOS: 1 day    Time spent:   Zannie Cove, MD [facility]   06/22/2022, 11:29 AM

## 2022-06-22 NOTE — Progress Notes (Signed)
Subjective:  Patient seen and examined at bedside, resting comfortably. Edema is improved and he is breathing better.  Intake/Output from previous day:  I/O last 3 completed shifts: In: 1157.6 [P.O.:820; I.V.:279.7; IV Piggyback:57.9] Out: 2300 [Urine:2300] Total I/O In: 132.1 [I.V.:132.1] Out: 200 [Urine:200] Net IO Since Admission: -1,210.4 mL [06/22/22 1119]  Blood pressure (!) 159/88, pulse 60, temperature 98 F (36.7 C), temperature source Oral, resp. rate 18, weight 130.7 kg, SpO2 95 %. Physical Exam Vitals reviewed.  HENT:     Head: Normocephalic and atraumatic.  Cardiovascular:     Rate and Rhythm: Normal rate. Rhythm irregular.     Pulses: Normal pulses.     Heart sounds: No murmur heard. Pulmonary:     Effort: Pulmonary effort is normal.     Breath sounds: Examination of the right-lower field reveals decreased breath sounds. Examination of the left-lower field reveals decreased breath sounds. Decreased breath sounds present.  Musculoskeletal:     Right lower leg: Edema present.     Left lower leg: Edema present.  Skin:    General: Skin is warm and dry.  Neurological:     Mental Status: He is alert.     Lab Results: Lab Results  Component Value Date   NA 136 06/22/2022   K 4.1 06/22/2022   CO2 24 06/22/2022   GLUCOSE 103 (H) 06/22/2022   BUN 36 (H) 06/22/2022   CREATININE 2.19 (H) 06/22/2022   CALCIUM 8.2 (L) 06/22/2022   GFRNONAA 33 (L) 06/22/2022    BNP (last 3 results) Recent Labs    06/20/22 1240  BNP 561.7*    ProBNP (last 3 results) No results for input(s): "PROBNP" in the last 8760 hours.    Latest Ref Rng & Units 06/22/2022   12:39 AM 06/20/2022   12:40 PM 03/09/2018    5:14 PM  BMP  Glucose 70 - 99 mg/dL 409  811  90   BUN 8 - 23 mg/dL 36  35  26   Creatinine 0.61 - 1.24 mg/dL 9.14  7.82  9.56   Sodium 135 - 145 mmol/L 136  138  138   Potassium 3.5 - 5.1 mmol/L 4.1  3.6  3.5   Chloride 98 - 111 mmol/L 104  106  101   CO2 22 - 32  mmol/L 24  21  28    Calcium 8.9 - 10.3 mg/dL 8.2  8.5  9.5       Latest Ref Rng & Units 06/22/2022   12:39 AM 10/06/2014   12:22 PM  Hepatic Function  Total Protein 6.5 - 8.1 g/dL 7.2  7.4   Albumin 3.5 - 5.0 g/dL 2.7  4.1   AST 15 - 41 U/L 20  19   ALT 0 - 44 U/L 20  22   Alk Phosphatase 38 - 126 U/L 58  54   Total Bilirubin 0.3 - 1.2 mg/dL 0.9  0.9       Latest Ref Rng & Units 06/22/2022   12:39 AM 06/20/2022   12:40 PM 03/09/2018    5:14 PM  CBC  WBC 4.0 - 10.5 K/uL 5.8  4.3  5.3   Hemoglobin 13.0 - 17.0 g/dL 21.3  08.6  57.8   Hematocrit 39.0 - 52.0 % 30.7  40.1  41.9   Platelets 150 - 400 K/uL 154  159  130.0    Lipid Panel     Component Value Date/Time   CHOL 223 (H) 10/06/2014 1222   TRIG 151 (H)  10/06/2014 1222   HDL 29 (L) 10/06/2014 1222   CHOLHDL 7.7 (H) 10/06/2014 1222   VLDL 30 10/06/2014 1222   LDLCALC 164 (H) 10/06/2014 1222   Cardiac Panel (last 3 results) No results for input(s): "CKTOTAL", "CKMB", "TROPONINI", "RELINDX" in the last 72 hours.  HEMOGLOBIN A1C Lab Results  Component Value Date   HGBA1C 6.1 (H) 06/21/2022   MPG 128.37 06/21/2022   TSH No results for input(s): "TSH" in the last 8760 hours. Imaging: Imaging results have been reviewed and DG Chest 2 View  Result Date: 06/20/2022 CLINICAL DATA:  Shortness of breath EXAM: CHEST - 2 VIEW COMPARISON:  CXR 04/18/21 FINDINGS: Cardiomegaly. Small right pleural effusion. No pneumothorax. Possible retrocardiac opacity. There are prominent bilateral interstitial opacities, which may be related to mild pulmonary edema or atypical infection. No radiographically apparent displaced rib fractures. Visualized upper abdomen is unremarkable. Vertebral body heights are maintained. IMPRESSION: 1. Cardiomegaly with small right pleural effusion and mild pulmonary edema. 2. Possible retrocardiac opacity, which could represent atelectasis or pneumonia. Electronically Signed   By: Lorenza Cambridge M.D.   On: 06/20/2022  13:05    Cardiac Studies:  EKG 04/21/2018: Atrial fibrillation with controlled ventricular response. Anterolateral ST-elevation -repolarization variant.    Echocardiogram 02/12/2018: - Left ventricle: The cavity size was normal. There was moderate   concentric hypertrophy. Systolic function was mildly reduced. The   estimated ejection fraction was in the range of 45% to 50%. Wall   motion was normal; there were no regional wall motion   abnormalities. Unable to evaluate diastolic function due to   atrial fibrillation. - Mitral valve: Moderately dilated annulus. There was mild   regurgitation. - Left atrium: The atrium was severely dilated. - Right ventricle: Systolic function was low normal. - Right atrium: The atrium was mildly dilated. - No evidence of pulmonary hypertension.   Cath 02/12/2018: LM: Distal 20% disease LAD: Ostial 20% disease Ramus: No significant disease LCx: Dominant. No significant disease RCA: Nondominant. No significant disease LVEDP normal Conclusion: Mild nonobstructive coronary artery disease Type 2 MI in the setting of hypertensive urgency.    EKG: 06/20/2022: Atrial fibrillation with CVR. Criteria for left ventricular hypertrophy met. Borderline prolonged QT interval. No evidence of ischemia.  Recent Results (from the past 13086 hour(s))  ECHOCARDIOGRAM COMPLETE   Collection Time: 02/12/18  8:51 AM  Result Value   Weight 4,238.4   Height 71   BP 160/103   Narrative                              *[place]*                   *[facility]*                         [address]                        [place], [place] [address]                            [phone]  ------------------------------------------------------------------- Transthoracic Echocardiography  (Report amended )  Patient:    James [name] MR #:       [id number] Study Date: 02/12/2018 Gender:     M Age:        57 Height:  180.3 cm Weight:      120.2 kg BSA:        2.5 m^2 Pt. Status: Room:       3E24C   ADMITTING    Doristine Devoid  REFERRING    Eduard Clos  PERFORMING   [place], Inpatient  ATTENDING    Myrtie Neither  SONOGRAPHER  Ilona Sorrel  cc:  ------------------------------------------------------------------- LV EF: 45% -   50%  ------------------------------------------------------------------- Indications:      CHF - 428.0.  ------------------------------------------------------------------- History:   PMH:  Elevated troponin.  Atrial fibrillation.  Risk factors:  Hypertension.  ------------------------------------------------------------------- Study Conclusions  - Left ventricle: The cavity size was normal. There was moderate   concentric hypertrophy. Systolic function was mildly reduced. The   estimated ejection fraction was in the range of 45% to 50%. Wall   motion was normal; there were no regional wall motion   abnormalities. Unable to evaluate diastolic function due to   atrial fibrillation. - Mitral valve: Moderately dilated annulus. There was mild   regurgitation. - Left atrium: The atrium was severely dilated. - Right ventricle: Systolic function was low normal. - Right atrium: The atrium was mildly dilated. - No evidence of pulmonary hypertension.  ------------------------------------------------------------------- Study data:  No prior study was available for comparison.  Study status:  Routine.  Procedure:  The patient reported no pain pre or post test. Transthoracic echocardiography. Image quality was adequate.  Study completion:  There were no complications. Transthoracic echocardiography.  M-mode, complete 2D, spectral Doppler, and color Doppler.  Birthdate:  Patient birthdate: [birth year].  Age:  Patient is 62 yr old.  Sex:  Gender: male. BMI: 37 kg/m^2.  Blood pressure:     160/103  Patient status: Inpatient.  Study date:  Study date:  02/12/2018. Study time: 08:03 AM.  Location:  Bedside.  -------------------------------------------------------------------  ------------------------------------------------------------------- Left ventricle:  The cavity size was normal. There was moderate concentric hypertrophy. Systolic function was mildly reduced. The estimated ejection fraction was in the range of 45% to 50%. Wall motion was normal; there were no regional wall motion abnormalities. Unable to evaluate diastolic function due to atrial fibrillation.  ------------------------------------------------------------------- Aortic valve:   The valve appears to be grossly normal.    Doppler:  There was trivial regurgitation.  ------------------------------------------------------------------- Mitral valve:   Moderately dilated annulus.  Doppler:  There was mild regurgitation.    Peak gradient (D): 4 mm Hg.  ------------------------------------------------------------------- Left atrium:  The atrium was severely dilated.  ------------------------------------------------------------------- Right ventricle:  The cavity size was normal. Systolic function was low normal.  ------------------------------------------------------------------- Pulmonic valve:    The valve appears to be grossly normal. Doppler:  There was trivial regurgitation.  ------------------------------------------------------------------- Tricuspid valve:   The valve appears to be grossly normal. Doppler:  There was trivial regurgitation.  ------------------------------------------------------------------- Right atrium:  The atrium was mildly dilated.  ------------------------------------------------------------------- Pericardium:  The pericardium was normal in appearance.  ------------------------------------------------------------------- Systemic veins:  Dilated IVC with normal respiratory  response.  ------------------------------------------------------------------- Measurements   Left ventricle                           Value        Reference  LV ID, ED, PLAX chordal                  49    mm     43 - 52  LV  ID, ES, PLAX chordal                  32    mm     23 - 38  LV fx shortening, PLAX chordal           35    %      >=29  LV PW thickness, ED                      19    mm     ----------  IVS/LV PW ratio, ED                      1            <=1.3  Stroke volume, 2D                        78    ml     ----------  Stroke volume/bsa, 2D                    31    ml/m^2 ----------  LV ejection fraction, 1-p A4C            37    %      ----------  LV end-diastolic volume, 2-p             144   ml     ----------  LV end-systolic volume, 2-p              85    ml     ----------  LV ejection fraction, 2-p                41    %      ----------  Stroke volume, 2-p                       59    ml     ----------  LV end-diastolic volume/bsa, 2-p         58    ml/m^2 ----------  LV end-systolic volume/bsa, 2-p          34    ml/m^2 ----------  Stroke volume/bsa, 2-p                   23.5  ml/m^2 ----------    Ventricular septum                       Value        Reference  IVS thickness, ED                        19    mm     ----------    LVOT                                     Value        Reference  LVOT ID, S                               24    mm     ----------  LVOT area  4.52  cm^2   ----------  LVOT peak velocity, S                    77.7  cm/s   ----------  LVOT mean velocity, S                    57    cm/s   ----------  LVOT VTI, S                              17.2  cm     ----------    Aorta                                    Value        Reference  Aortic root ID, ED                       38    mm     ----------    Left atrium                              Value        Reference  LA ID, A-P, ES                           52    mm      ----------  LA ID/bsa, A-P                           2.08  cm/m^2 <=2.2  LA volume, S                             141   ml     ----------  LA volume/bsa, S                         56.4  ml/m^2 ----------  LA volume, ES, 1-p A4C                   120   ml     ----------  LA volume/bsa, ES, 1-p A4C               48    ml/m^2 ----------  LA volume, ES, 1-p A2C                   160   ml     ----------  LA volume/bsa, ES, 1-p A2C               64    ml/m^2 ----------    Mitral valve                             Value        Reference  Mitral E-wave peak velocity              106   cm/s   ----------  Mitral deceleration time                 208   ms     150 -  230  Mitral peak gradient, D                  4     mm Hg  ----------    Pulmonary arteries                       Value        Reference  PA pressure, S, DP                       22    mm Hg  <=30    Tricuspid valve                          Value        Reference  Tricuspid regurg peak velocity           131   cm/s   ----------  Tricuspid peak RV-RA gradient            7     mm Hg  ----------    Right atrium                             Value        Reference  RA ID, S-I, ES, A4C              (H)     62    mm     34 - 49  RA area, ES, A4C                 (H)     25.2  cm^2   8.3 - 19.5  RA volume, ES, A/L                       85.7  ml     ----------  RA volume/bsa, ES, A/L                   34.3  ml/m^2 ----------    Systemic veins                           Value        Reference  Estimated CVP                            15    mm Hg  ----------    Right ventricle                          Value        Reference  TAPSE                                    16.3  mm     ----------  RV pressure, S, DP                       22    mm Hg  <=30  RV s&', lateral, S                        8.59  cm/s   ----------  Legend: (L)  and  (  H)  mark values outside specified reference  range.  ------------------------------------------------------------------- Verta Ellen, MD 2019-12-20T15:35:40   *Note: Due to a large number of results and/or encounters for the requested time period, some results have not been displayed. A complete set of results can be found in Results Review.    Scheduled Meds:  apixaban  5 mg Oral BID   empagliflozin  10 mg Oral Daily   furosemide  60 mg Intravenous Q12H   furosemide  60 mg Intravenous Once   guaiFENesin  600 mg Oral BID   isosorbide-hydrALAZINE  1 tablet Oral TID   metoprolol tartrate  12.5 mg Oral BID   potassium chloride  20 mEq Oral Daily   sodium chloride flush  3 mL Intravenous Q12H   Continuous Infusions:  sodium chloride     amiodarone 30 mg/hr (06/22/22 0907)   PRN Meds:.sodium chloride, acetaminophen, ondansetron (ZOFRAN) IV, sodium chloride flush  Assessment & Plan: James [name] is a 62 y.o. male patient with Afib CVR and AECHF  Afib CVR Amio gtt and bolus ordered Continue diuresis, will keep on IV throughout weekend and likely switch to PO Monday morning. Would do Lasix PO 60 mg BID. Continue metoprolol 12.5 mg BID Currently on Eliquis Will not opt for DCCV as he has not been anticoagulated long enough and he may convert on amio. If patient does not convert on amio we can schedule TEE with DCCV.     Acute on chronic combined systolic and diastolic heart failure, currently decompensated Continue IV diuresis. Start PO Lasix tomorrow. He is diuresing well. I will give additional IV Lasix dose today so in total he gets 60 mg IV 3x. Continue to watch creatinine as it is trending up however he likely has CKD. Patient is on GDMT Echocardiogram has been ordered. He lost his insurance and was lost to follow-up but now he has it back and plans to follow closely with [place] in office     Hypertension: Continue current medications, up-titrate as HR allows.      Clotilde Dieter,  DO 06/22/2022, 11:19 AM Office: [phone] Fax: [phone] Pager: [phone]

## 2022-06-23 ENCOUNTER — Other Ambulatory Visit (HOSPITAL_COMMUNITY): Payer: Self-pay

## 2022-06-23 DIAGNOSIS — I5021 Acute systolic (congestive) heart failure: Secondary | ICD-10-CM | POA: Diagnosis not present

## 2022-06-23 LAB — ECHOCARDIOGRAM COMPLETE
AR max vel: 2.81 cm2
AV Area VTI: 2.69 cm2
AV Area mean vel: 2.72 cm2
AV Mean grad: 2.5 mmHg
AV Peak grad: 5.2 mmHg
Ao pk vel: 1.14 m/s
Area-P 1/2: 4.31 cm2
Calc EF: 54 %
S' Lateral: 4 cm
Single Plane A2C EF: 58.1 %
Single Plane A4C EF: 50.2 %
Weight: 4610.26 oz

## 2022-06-23 LAB — BASIC METABOLIC PANEL
Anion gap: 9 (ref 5–15)
BUN: 40 mg/dL — ABNORMAL HIGH (ref 8–23)
CO2: 24 mmol/L (ref 22–32)
Calcium: 8.1 mg/dL — ABNORMAL LOW (ref 8.9–10.3)
Chloride: 102 mmol/L (ref 98–111)
Creatinine, Ser: 2.47 mg/dL — ABNORMAL HIGH (ref 0.61–1.24)
GFR, Estimated: 29 mL/min — ABNORMAL LOW (ref >60)
Glucose, Bld: 92 mg/dL (ref 70–99)
Potassium: 3.7 mmol/L (ref 3.5–5.1)
Sodium: 135 mmol/L (ref 135–145)

## 2022-06-23 LAB — PROTIME-INR
INR: 1.5 — ABNORMAL HIGH (ref 0.8–1.2)
Prothrombin Time: 18.1 seconds — ABNORMAL HIGH (ref 11.4–15.2)

## 2022-06-23 MED ORDER — FUROSEMIDE 40 MG PO TABS
60.0000 mg | ORAL_TABLET | Freq: Two times a day (BID) | ORAL | Status: DC
  Administered 2022-06-23 – 2022-06-25 (×5): 60 mg via ORAL
  Filled 2022-06-23 (×5): qty 1

## 2022-06-23 MED ORDER — ISOSORB DINITRATE-HYDRALAZINE 20-37.5 MG PO TABS
2.0000 | ORAL_TABLET | Freq: Three times a day (TID) | ORAL | Status: DC
  Administered 2022-06-23 – 2022-06-25 (×7): 2 via ORAL
  Filled 2022-06-23 (×7): qty 2

## 2022-06-23 MED ORDER — SODIUM CHLORIDE 0.9 % IV SOLN: INTRAVENOUS | Status: DC

## 2022-06-23 MED ORDER — METOPROLOL SUCCINATE ER 25 MG PO TB24
25.0000 mg | ORAL_TABLET | Freq: Every day | ORAL | Status: DC
  Administered 2022-06-23: 25 mg via ORAL
  Filled 2022-06-23: qty 1

## 2022-06-23 MED ORDER — PREDNISONE 20 MG PO TABS
40.0000 mg | ORAL_TABLET | Freq: Every day | ORAL | Status: AC
  Administered 2022-06-23 – 2022-06-24 (×2): 40 mg via ORAL
  Filled 2022-06-23 (×2): qty 2

## 2022-06-23 NOTE — Progress Notes (Signed)
Subjective:  Patient seen and examined at bedside, resting comfortably. No evernt overnight. He is feeling better. Denies chest pain, shortness of breath, palpitations, diaphoresis, syncope, edema, PND, orthopnea.   Intake/Output from previous day:  I/O last 3 completed shifts: In: 1155.9 [P.O.:600; I.V.:555.9] Out: 2601 [Urine:2600; Stool:1] Total I/O In: 120 [P.O.:120] Out: -  Net IO Since Admission: -2,106.8 mL [06/23/22 0848]  Blood pressure (!) 170/99, pulse (!) 54, temperature 97.9 F (36.6 C), temperature source Oral, resp. rate 19, weight 128.4 kg, SpO2 95 %. Physical Exam Vitals reviewed.  HENT:     Head: Normocephalic and atraumatic.  Cardiovascular:     Rate and Rhythm: Normal rate. Rhythm irregular.     Pulses: Normal pulses.     Heart sounds: Normal heart sounds. No murmur heard. Pulmonary:     Effort: Pulmonary effort is normal.     Breath sounds: Normal breath sounds.  Abdominal:     General: Bowel sounds are normal.  Musculoskeletal:     Right lower leg: Edema (trace) present.     Left lower leg: Edema (trace) present.  Skin:    General: Skin is warm and dry.  Neurological:     Mental Status: He is alert.     Lab Results: Lab Results  Component Value Date   NA 135 06/23/2022   K 3.7 06/23/2022   CO2 24 06/23/2022   GLUCOSE 92 06/23/2022   BUN 40 (H) 06/23/2022   CREATININE 2.47 (H) 06/23/2022   CALCIUM 8.1 (L) 06/23/2022   GFRNONAA 29 (L) 06/23/2022    BNP (last 3 results) Recent Labs    06/20/22 1240  BNP 561.7*    ProBNP (last 3 results) No results for input(s): "PROBNP" in the last 8760 hours.    Latest Ref Rng & Units 06/23/2022    1:00 AM 06/22/2022   12:39 AM 06/20/2022   12:40 PM  BMP  Glucose 70 - 99 mg/dL 92  103  111   BUN 8 - 23 mg/dL 40  36  35   Creatinine 0.61 - 1.24 mg/dL 2.47  2.19  1.87   Sodium 135 - 145 mmol/L 135  136  138   Potassium 3.5 - 5.1 mmol/L 3.7  4.1  3.6   Chloride 98 - 111 mmol/L 102  104  106   CO2 22  - 32 mmol/L 24  24  21   Calcium 8.9 - 10.3 mg/dL 8.1  8.2  8.5       Latest Ref Rng & Units 06/22/2022   12:39 AM 10/06/2014   12:22 PM  Hepatic Function  Total Protein 6.5 - 8.1 g/dL 7.2  7.4   Albumin 3.5 - 5.0 g/dL 2.7  4.1   AST 15 - 41 U/L 20  19   ALT 0 - 44 U/L 20  22   Alk Phosphatase 38 - 126 U/L 58  54   Total Bilirubin 0.3 - 1.2 mg/dL 0.9  0.9       Latest Ref Rng & Units 06/22/2022   12:39 AM 06/20/2022   12:40 PM 03/09/2018    5:14 PM  CBC  WBC 4.0 - 10.5 K/uL 5.8  4.3  5.3   Hemoglobin 13.0 - 17.0 g/dL 10.1  12.2  13.9   Hematocrit 39.0 - 52.0 % 30.7  40.1  41.9   Platelets 150 - 400 K/uL 154  159  130.0    Lipid Panel     Component Value Date/Time   CHOL 223 (  H) 10/06/2014 1222   TRIG 151 (H) 10/06/2014 1222   HDL 29 (L) 10/06/2014 1222   CHOLHDL 7.7 (H) 10/06/2014 1222   VLDL 30 10/06/2014 1222   LDLCALC 164 (H) 10/06/2014 1222   Cardiac Panel (last 3 results) No results for input(s): "CKTOTAL", "CKMB", "TROPONINI", "RELINDX" in the last 72 hours.  HEMOGLOBIN A1C Lab Results  Component Value Date   HGBA1C 6.1 (H) 06/21/2022   MPG 128.37 06/21/2022   TSH No results for input(s): "TSH" in the last 8760 hours. Imaging: Imaging results have been reviewed and No results found.  Cardiac Studies:  EKG 04/21/2018: Atrial fibrillation with controlled ventricular response. Anterolateral ST-elevation -repolarization variant.    Echocardiogram 02/12/2018: - Left ventricle: The cavity size was normal. There was moderate   concentric hypertrophy. Systolic function was mildly reduced. The   estimated ejection fraction was in the range of 45% to 50%. Wall   motion was normal; there were no regional wall motion   abnormalities. Unable to evaluate diastolic function due to   atrial fibrillation. - Mitral valve: Moderately dilated annulus. There was mild   regurgitation. - Left atrium: The atrium was severely dilated. - Right ventricle: Systolic function was  low normal. - Right atrium: The atrium was mildly dilated. - No evidence of pulmonary hypertension.   Cath 02/12/2018: LM: Distal 20% disease LAD: Ostial 20% disease Ramus: No significant disease LCx: Dominant. No significant disease RCA: Nondominant. No significant disease LVEDP normal Conclusion: Mild nonobstructive coronary artery disease Type 2 MI in the setting of hypertensive urgency.    EKG: 06/20/2022: Atrial fibrillation with CVR. Criteria for left ventricular hypertrophy met. Borderline prolonged QT interval. No evidence of ischemia.   Tele: Afib CVR    Recent Results (from the past 43800 hour(s))  ECHOCARDIOGRAM COMPLETE   Collection Time: 06/22/22  5:21 PM  Result Value   Weight 4,610.26   BP 162/97   *Note: Due to a large number of results and/or encounters for the requested time period, some results have not been displayed. A complete set of results can be found in Results Review.    Scheduled Meds:  apixaban  5 mg Oral BID   empagliflozin  10 mg Oral Daily   furosemide  60 mg Oral BID   guaiFENesin  600 mg Oral BID   isosorbide-hydrALAZINE  2 tablet Oral TID   metoprolol succinate  25 mg Oral QHS   potassium chloride  20 mEq Oral Daily   sodium chloride flush  3 mL Intravenous Q12H   Continuous Infusions:  sodium chloride     amiodarone 30 mg/hr (06/23/22 0043)   PRN Meds:.sodium chloride, acetaminophen, ondansetron (ZOFRAN) IV, sodium chloride flush  Assessment & Plan James [name] is a 62 y.o. male patient with Afib CVR and AECHF    Afib CVR Continue on amio gtt. Will switch to PO once gtt complete - starting amio 400 mg BID for 10 days followed by amio 200 mg BID for 10 days, then amio 200 mg qDay thereafter Continue metoprolol 12.5 mg BID Currently on Eliquis I will schedule TEE with DCCV.     Acute on chronic combined systolic and diastolic heart failure, currently decompensated Will give morning dose of IV Lasix and start Lasix PO  60 mg today. Continue to watch creatinine as it is trending up however he likely has CKD, would appreciate nephrology recommendations. Patient is on GDMT, I have up-titrated Bidil as his BP is still uncontrolled. I cannot add ACE-I or   ARB given his Creatinine at this time. Once stable, we can start Entresto or Losartan. He lost his insurance and was lost to follow-up but now he has it back and plans to follow closely with us in office     Hypertension: Continue current medications, up-titrate as HR allows. Bidil doubled today D/C amlodipine as he is complaining of pedal edema    Mavryk Pino, DO 06/23/2022, 8:48 AM Office: [phone] Fax: [phone] Pager: [phone]  

## 2022-06-23 NOTE — TOC Benefit Eligibility Note (Signed)
Patient Product/process development scientist completed.    The patient is currently admitted and upon discharge could be taking Eliquis 5 mg.  The current 30 day co-pay is $15.00.   The patient is currently admitted and upon discharge could be taking Jardiance 10 mg.  The current 30 day co-pay is $15.00.   The patient is currently admitted and upon discharge could be taking isosorbide-hydralazine (Bidil) 20-37.5 mg.  The current 30 day co-pay is $0.00.   The patient is insured through [employer]   This test claim was processed through [employer]- copay amounts may vary at other pharmacies due to [employer], or as the patient moves through the different stages of their insurance plan.  Roland Earl, CPHT Pharmacy Patient Advocate Specialist [facility] Patient Advocate Team Direct Number: [phone]  Fax: [phone]

## 2022-06-23 NOTE — H&P (View-Only) (Signed)
Subjective:  Patient seen and examined at bedside, resting comfortably. No evernt overnight. He is feeling better. Denies chest pain, shortness of breath, palpitations, diaphoresis, syncope, edema, PND, orthopnea.   Intake/Output from previous day:  I/O last 3 completed shifts: In: 1155.9 [P.O.:600; I.V.:555.9] Out: 2601 [Urine:2600; Stool:1] Total I/O In: 120 [P.O.:120] Out: -  Net IO Since Admission: -2,106.8 mL [06/23/22 0848]  Blood pressure (!) 170/99, pulse (!) 54, temperature 97.9 F (36.6 C), temperature source Oral, resp. rate 19, weight 128.4 kg, SpO2 95 %. Physical Exam Vitals reviewed.  HENT:     Head: Normocephalic and atraumatic.  Cardiovascular:     Rate and Rhythm: Normal rate. Rhythm irregular.     Pulses: Normal pulses.     Heart sounds: Normal heart sounds. No murmur heard. Pulmonary:     Effort: Pulmonary effort is normal.     Breath sounds: Normal breath sounds.  Abdominal:     General: Bowel sounds are normal.  Musculoskeletal:     Right lower leg: Edema (trace) present.     Left lower leg: Edema (trace) present.  Skin:    General: Skin is warm and dry.  Neurological:     Mental Status: He is alert.     Lab Results: Lab Results  Component Value Date   NA 135 06/23/2022   K 3.7 06/23/2022   CO2 24 06/23/2022   GLUCOSE 92 06/23/2022   BUN 40 (H) 06/23/2022   CREATININE 2.47 (H) 06/23/2022   CALCIUM 8.1 (L) 06/23/2022   GFRNONAA 29 (L) 06/23/2022    BNP (last 3 results) Recent Labs    06/20/22 1240  BNP 561.7*    ProBNP (last 3 results) No results for input(s): "PROBNP" in the last 8760 hours.    Latest Ref Rng & Units 06/23/2022    1:00 AM 06/22/2022   12:39 AM 06/20/2022   12:40 PM  BMP  Glucose 70 - 99 mg/dL 92  191  478   BUN 8 - 23 mg/dL 40  36  35   Creatinine 0.61 - 1.24 mg/dL 2.95  6.21  3.08   Sodium 135 - 145 mmol/L 135  136  138   Potassium 3.5 - 5.1 mmol/L 3.7  4.1  3.6   Chloride 98 - 111 mmol/L 102  104  106   CO2 22  - 32 mmol/L 24  24  21    Calcium 8.9 - 10.3 mg/dL 8.1  8.2  8.5       Latest Ref Rng & Units 06/22/2022   12:39 AM 10/06/2014   12:22 PM  Hepatic Function  Total Protein 6.5 - 8.1 g/dL 7.2  7.4   Albumin 3.5 - 5.0 g/dL 2.7  4.1   AST 15 - 41 U/L 20  19   ALT 0 - 44 U/L 20  22   Alk Phosphatase 38 - 126 U/L 58  54   Total Bilirubin 0.3 - 1.2 mg/dL 0.9  0.9       Latest Ref Rng & Units 06/22/2022   12:39 AM 06/20/2022   12:40 PM 03/09/2018    5:14 PM  CBC  WBC 4.0 - 10.5 K/uL 5.8  4.3  5.3   Hemoglobin 13.0 - 17.0 g/dL 65.7  84.6  96.2   Hematocrit 39.0 - 52.0 % 30.7  40.1  41.9   Platelets 150 - 400 K/uL 154  159  130.0    Lipid Panel     Component Value Date/Time   CHOL 223 (  H) 10/06/2014 1222   TRIG 151 (H) 10/06/2014 1222   HDL 29 (L) 10/06/2014 1222   CHOLHDL 7.7 (H) 10/06/2014 1222   VLDL 30 10/06/2014 1222   LDLCALC 164 (H) 10/06/2014 1222   Cardiac Panel (last 3 results) No results for input(s): "CKTOTAL", "CKMB", "TROPONINI", "RELINDX" in the last 72 hours.  HEMOGLOBIN A1C Lab Results  Component Value Date   HGBA1C 6.1 (H) 06/21/2022   MPG 128.37 06/21/2022   TSH No results for input(s): "TSH" in the last 8760 hours. Imaging: Imaging results have been reviewed and No results found.  Cardiac Studies:  EKG 04/21/2018: Atrial fibrillation with controlled ventricular response. Anterolateral ST-elevation -repolarization variant.    Echocardiogram 02/12/2018: - Left ventricle: The cavity size was normal. There was moderate   concentric hypertrophy. Systolic function was mildly reduced. The   estimated ejection fraction was in the range of 45% to 50%. Wall   motion was normal; there were no regional wall motion   abnormalities. Unable to evaluate diastolic function due to   atrial fibrillation. - Mitral valve: Moderately dilated annulus. There was mild   regurgitation. - Left atrium: The atrium was severely dilated. - Right ventricle: Systolic function was  low normal. - Right atrium: The atrium was mildly dilated. - No evidence of pulmonary hypertension.   Cath 02/12/2018: LM: Distal 20% disease LAD: Ostial 20% disease Ramus: No significant disease LCx: Dominant. No significant disease RCA: Nondominant. No significant disease LVEDP normal Conclusion: Mild nonobstructive coronary artery disease Type 2 MI in the setting of hypertensive urgency.    EKG: 06/20/2022: Atrial fibrillation with CVR. Criteria for left ventricular hypertrophy met. Borderline prolonged QT interval. No evidence of ischemia.   Tele: Afib CVR    Recent Results (from the past 16109 hour(s))  ECHOCARDIOGRAM COMPLETE   Collection Time: 06/22/22  5:21 PM  Result Value   Weight 4,610.26   BP 162/97   *Note: Due to a large number of results and/or encounters for the requested time period, some results have not been displayed. A complete set of results can be found in Results Review.    Scheduled Meds:  apixaban  5 mg Oral BID   empagliflozin  10 mg Oral Daily   furosemide  60 mg Oral BID   guaiFENesin  600 mg Oral BID   isosorbide-hydrALAZINE  2 tablet Oral TID   metoprolol succinate  25 mg Oral QHS   potassium chloride  20 mEq Oral Daily   sodium chloride flush  3 mL Intravenous Q12H   Continuous Infusions:  sodium chloride     amiodarone 30 mg/hr (06/23/22 0043)   PRN Meds:.sodium chloride, acetaminophen, ondansetron (ZOFRAN) IV, sodium chloride flush  Assessment & Plan James [name] is a 62 y.o. male patient with Afib CVR and AECHF    Afib CVR Continue on amio gtt. Will switch to PO once gtt complete - starting amio 400 mg BID for 10 days followed by amio 200 mg BID for 10 days, then amio 200 mg qDay thereafter Continue metoprolol 12.5 mg BID Currently on Eliquis I will schedule TEE with DCCV.     Acute on chronic combined systolic and diastolic heart failure, currently decompensated Will give morning dose of IV Lasix and start Lasix PO  60 mg today. Continue to watch creatinine as it is trending up however he likely has CKD, would appreciate nephrology recommendations. Patient is on GDMT, I have up-titrated Bidil as his BP is still uncontrolled. I cannot add ACE-I or  ARB given his Creatinine at this time. Once stable, we can start Entresto or Losartan. He lost his insurance and was lost to follow-up but now he has it back and plans to follow closely with [place] in office     Hypertension: Continue current medications, up-titrate as HR allows. Bidil doubled today D/C amlodipine as he is complaining of pedal edema    Clotilde Dieter, DO 06/23/2022, 8:48 AM Office: [phone] Fax: [phone] Pager: [phone]

## 2022-06-23 NOTE — Discharge Instructions (Signed)

## 2022-06-23 NOTE — Progress Notes (Signed)
Heart Failure Navigator Progress Note  Assessed for Heart & Vascular TOC clinic readiness.  Patient does not meet criteria due to [facility] patient.   Navigator will sign off at this time.   Marlene Beidler, BSN, RN Heart Failure Nurse Navigator Secure Chat Only   

## 2022-06-23 NOTE — Progress Notes (Signed)
PROGRESS NOTE    James [name]  WUJ:[id number] DOB: February 26, 1960 DOA: 06/20/2022 PCP: Pcp, No  61/M with history of hypertension, chronic systolic CHF, NICM, chronic A-fib not on anticoagulation, obesity presented to the ED with worsening dyspnea, orthopnea and lower extremity edema.  Has been taking some of his blood pressure medicines intermittently, has not seen a provider in 3+ years -In the ER hypertensive, hemoglobin 12.2, creatinine 1.8, BNP 561, chest x-ray with cardiomegaly and mild pulmonary edema, EKG noted A-fib -Admitted, improving on diuretics, started on amiodarone for A-fib  Subjective: -Feels better, breathing is improving, complains of left ankle swelling  Assessment and Plan:  Acute on chronic systolic CHF -Echo in 2019 noted EF of 45%, mild mitral regurgitation -Cath 2019 noted mild nonobstructive CAD -Lost to follow-up -2D echo completed yesterday, results pending, volume status has improved, urine output is inaccurate, agree with changing to oral Lasix today, bump in creatinine noted, baseline is unknown  -Continue Jardiance and BiDil  -BMP in a.m.  CKD stage 3a, GFR 45-59 ml/min (HCC) Last Scr was 1.25 in 01/2020.   -No recent baseline, hold ACE inhibitor - bump in creatinine, unknown baseline, suspect might be in the twos, will discuss with nephrology  P. atrial fibrillation (HCC) Chronic afib. Not on systemic anticoagulation. Has been [facility] in the past.  -Continue Eliquis -Cards following, now on amiodarone, plan for TEE with DCCV tomorrow  Essential hypertension -As above, needs PCP  Cocaine use Admits to cocaine use about 1 month ago prior to going to music concert with his friends. States he does not use cocaine often.  -UDS negative  Obesity (BMI 30-39.9) Chronic.   DVT prophylaxis: apixaban Code Status: Full code Family Communication: None present Disposition Plan: Home in 1 to 2 days  Consultants:  Cards   Procedures:   Antimicrobials:    Objective: Vitals:   06/23/22 0500 06/23/22 0730 06/23/22 0732 06/23/22 1120  BP:  (!) 170/99  (!) 140/95  Pulse:  (!) 54  65  Resp:  18 19 18   Temp:  97.9 F (36.6 C)  97.9 F (36.6 C)  TempSrc:  Oral  Oral  SpO2:  95%    Weight: 128.4 kg       Intake/Output Summary (Last 24 hours) at 06/23/2022 1225 Last data filed at 06/23/2022 1200 Gross per 24 hour  Intake 804.6 ml  Output 2851 ml  Net -2046.4 ml   Filed Weights   06/21/22 0500 06/22/22 0500 06/23/22 0500  Weight: 130.5 kg 130.7 kg 128.4 kg    Examination:  General exam: AAOx3, no distress HEENT: No JVD CVS: S1-S2, irregular rhythm Lungs: Clear bilaterally Abdomen: Soft, nontender, bowel sounds present Extremities: Trace edema, right ankle with tenderness and swelling  Skin: No rashes Psychiatry:  Mood & affect appropriate.     Data Reviewed:   CBC: Recent Labs  Lab 06/20/22 1240 06/22/22 0039  WBC 4.3 5.8  HGB 12.2* 10.1*  HCT 40.1 30.7*  MCV 85.0 80.8  PLT 159 154   Basic Metabolic Panel: Recent Labs  Lab 06/20/22 1240 06/22/22 0039 06/23/22 0100  NA 138 136 135  K 3.6 4.1 3.7  CL 106 104 102  CO2 21* 24 24  GLUCOSE 111* 103* 92  BUN 35* 36* 40*  CREATININE 1.87* 2.19* 2.47*  CALCIUM 8.5* 8.2* 8.1*   GFR: CrCl cannot be calculated (Unknown ideal weight.). Liver Function Tests: Recent Labs  Lab 06/22/22 0039  AST 20  ALT 20  ALKPHOS 58  BILITOT 0.9  PROT 7.2  ALBUMIN 2.7*   No results for input(s): "LIPASE", "AMYLASE" in the last 168 hours. No results for input(s): "AMMONIA" in the last 168 hours. Coagulation Profile: No results for input(s): "INR", "PROTIME" in the last 168 hours. Cardiac Enzymes: No results for input(s): "CKTOTAL", "CKMB", "CKMBINDEX", "TROPONINI" in the last 168 hours. BNP (last 3 results) No results for input(s): "PROBNP" in the last 8760 hours. HbA1C: Recent Labs    06/21/22 0427  HGBA1C 6.1*    CBG: No results for input(s): "GLUCAP" in the last 168 hours. Lipid Profile: No results for input(s): "CHOL", "HDL", "LDLCALC", "TRIG", "CHOLHDL", "LDLDIRECT" in the last 72 hours. Thyroid Function Tests: No results for input(s): "TSH", "T4TOTAL", "FREET4", "T3FREE", "THYROIDAB" in the last 72 hours. Anemia Panel: No results for input(s): "VITAMINB12", "FOLATE", "FERRITIN", "TIBC", "IRON", "RETICCTPCT" in the last 72 hours. Urine analysis:    Component Value Date/Time   COLORURINE YELLOW 06/21/2022 0146   APPEARANCEUR HAZY (A) 06/21/2022 0146   LABSPEC 1.006 06/21/2022 0146   PHURINE 5.0 06/21/2022 0146   GLUCOSEU NEGATIVE 06/21/2022 0146   HGBUR LARGE (A) 06/21/2022 0146   BILIRUBINUR NEGATIVE 06/21/2022 0146   KETONESUR NEGATIVE 06/21/2022 0146   PROTEINUR NEGATIVE 06/21/2022 0146   NITRITE NEGATIVE 06/21/2022 0146   LEUKOCYTESUR SMALL (A) 06/21/2022 0146   Sepsis Labs: @LABRCNTIP (procalcitonin:4,lacticidven:4)  )No results found for this or any previous visit (from the past 240 hour(s)).   Radiology Studies: No results found.   Scheduled Meds:  apixaban  5 mg Oral BID   empagliflozin  10 mg Oral Daily   furosemide  60 mg Oral BID   guaiFENesin  600 mg Oral BID   isosorbide-hydrALAZINE  2 tablet Oral TID   metoprolol succinate  25 mg Oral QHS   potassium chloride  20 mEq Oral Daily   predniSONE  40 mg Oral Q breakfast   sodium chloride flush  3 mL Intravenous Q12H   Continuous Infusions:  sodium chloride     amiodarone 30 mg/hr (06/23/22 0043)     LOS: 2 days    Time spent:   Zannie Cove, MD [facility]   06/23/2022, 12:25 PM

## 2022-06-24 ENCOUNTER — Encounter (HOSPITAL_COMMUNITY)
Admission: EM | Disposition: A | Discharge status: Home / Self Care | Payer: Self-pay | Source: Home / Self Care | Attending: Internal Medicine

## 2022-06-24 ENCOUNTER — Inpatient Hospital Stay (HOSPITAL_COMMUNITY): Payer: BLUE CROSS/BLUE SHIELD | Admitting: Anesthesiology

## 2022-06-24 ENCOUNTER — Encounter (HOSPITAL_COMMUNITY): Payer: Self-pay | Admitting: Internal Medicine

## 2022-06-24 ENCOUNTER — Inpatient Hospital Stay (HOSPITAL_COMMUNITY): Payer: BLUE CROSS/BLUE SHIELD

## 2022-06-24 DIAGNOSIS — I5021 Acute systolic (congestive) heart failure: Secondary | ICD-10-CM | POA: Diagnosis not present

## 2022-06-24 DIAGNOSIS — I4819 Other persistent atrial fibrillation: Secondary | ICD-10-CM

## 2022-06-24 HISTORY — PX: TEE WITHOUT CARDIOVERSION: SHX5443

## 2022-06-24 HISTORY — PX: CARDIOVERSION: SHX1299

## 2022-06-24 LAB — BASIC METABOLIC PANEL
Anion gap: 8 (ref 5–15)
BUN: 45 mg/dL — ABNORMAL HIGH (ref 8–23)
CO2: 23 mmol/L (ref 22–32)
Calcium: 8.4 mg/dL — ABNORMAL LOW (ref 8.9–10.3)
Chloride: 101 mmol/L (ref 98–111)
Creatinine, Ser: 2.27 mg/dL — ABNORMAL HIGH (ref 0.61–1.24)
GFR, Estimated: 32 mL/min — ABNORMAL LOW (ref >60)
Glucose, Bld: 115 mg/dL — ABNORMAL HIGH (ref 70–99)
Potassium: 3.9 mmol/L (ref 3.5–5.1)
Sodium: 132 mmol/L — ABNORMAL LOW (ref 135–145)

## 2022-06-24 LAB — ECHO TEE

## 2022-06-24 SURGERY — ECHOCARDIOGRAM, TRANSESOPHAGEAL
Anesthesia: Monitor Anesthesia Care

## 2022-06-24 MED ORDER — LIDOCAINE HCL (CARDIAC) PF 100 MG/5ML IV SOSY
PREFILLED_SYRINGE | INTRAVENOUS | Status: DC | PRN
  Administered 2022-06-24: 50 mg via INTRAVENOUS

## 2022-06-24 MED ORDER — PROPOFOL 10 MG/ML IV BOLUS
INTRAVENOUS | Status: DC | PRN
  Administered 2022-06-24: 40 mg via INTRAVENOUS

## 2022-06-24 MED ORDER — PROPOFOL 500 MG/50ML IV EMUL
INTRAVENOUS | Status: DC | PRN
  Administered 2022-06-24: 150 ug/kg/min via INTRAVENOUS

## 2022-06-24 SURGICAL SUPPLY — 1 items: ELECT DEFIB PAD ADLT CADENCE (PAD) ×2 IMPLANT

## 2022-06-24 NOTE — Progress Notes (Signed)
  Echocardiogram Echocardiogram Transesophageal has been performed.  Delcie Roch 06/24/2022, 9:19 AM

## 2022-06-24 NOTE — CV Procedure (Signed)
TEE: Under deep sedation administered by anesthesiology, TEE was performed without complications: LV: EF around 40%. RV: Mildly reduced systolic function. LA: Moderate enlargement. Significant amount of spontaneous echo contrast although no definite thrombus in LA or LAA. Reduced LAA function. Inter atrial septum is intact without defect.  RA: Moderate enlargement  MV: Mild to mod MR. TV: Mild to mod TR AV: Normal. No AI or AS. PV: Normal. Trace PI.  Thoracic and ascending aorta: Not well visualized.   Deep sedation protocol as per anesthesiology.  Patient tolerated the procedure well and there was no immediate complication.  Ventricular rate in 40s, significant amount of spontaneous echo contrast in LA/LAA. Therefore, I decided to defer cardioversion at this time. Rate control strategy appropriate for now. I have stopped IV amiodarone at this time. Will monitor for Afib rate.    Elder Negus, MD Pager: [phone] Office: [phone]

## 2022-06-24 NOTE — TOC Initial Note (Signed)
Transition of Care [facility]) - Initial/Assessment Note    Patient Details  Name: James [name] MRN: [id number] Date of Birth: 1960/03/14  [facility] CM/SW Contact:    Leone Haven, RN Phone Number: 06/24/2022, 6:12 PM  Clinical Narrative:                 Patient with new onset afib, amio drip stopped, iv abx, has no PCP. He has been going to [facility] practice and urgent care in the past.  Will schedule a hospital follow up apt for him in the am.   Expected Discharge Plan: Home/Self Care Barriers to Discharge: Continued Medical Work up   Patient Goals and CMS Choice Patient states their goals for this hospitalization and ongoing recovery are:: return home   Choice offered to / list presented to : NA      Expected Discharge Plan and Services In-house Referral: NA Discharge Planning Services: CM Consult Post Acute Care Choice: NA Living arrangements for the past 2 months: Single Family Home                 DME Arranged: N/A DME Agency: NA       HH Arranged: NA          Prior Living Arrangements/Services Living arrangements for the past 2 months: Single Family Home Lives with:: Self Patient language and need for interpreter reviewed:: Yes Do you feel safe going back to the place where you live?: Yes      Need for Family Participation in Patient Care: No (Comment) Care giver support system in place?: No (comment)   Criminal Activity/Legal Involvement Pertinent to Current Situation/Hospitalization: No - Comment as needed  Activities of Daily Living Home Assistive Devices/Equipment: None ADL Screening (condition at time of admission) Patient's cognitive ability adequate to safely complete daily activities?: Yes Is the patient deaf or have difficulty hearing?: No Does the patient have difficulty seeing, even when wearing glasses/contacts?: No Does the patient have difficulty concentrating, remembering, or making decisions?: No Patient able  to express need for assistance with ADLs?: Yes Does the patient have difficulty dressing or bathing?: No Independently performs ADLs?: Yes (appropriate for developmental age) Does the patient have difficulty walking or climbing stairs?: No Weakness of Legs: Both Weakness of Arms/Hands: None  Permission Sought/Granted                  Emotional Assessment       Orientation: : Oriented to Self, Oriented to Place, Oriented to  Time, Oriented to Situation Alcohol / Substance Use: Not Applicable Psych Involvement: No (comment)  Admission diagnosis:  Primary hypertension [I10] SOB (shortness of breath) [R06.02] Acute systolic CHF (congestive heart failure) (HCC) [I50.21] Chest pain, unspecified type [R07.9] Congestive heart failure, unspecified HF chronicity, unspecified heart failure type (HCC) [I50.9] Patient Active Problem List   Diagnosis Date Noted   Persistent atrial fibrillation (HCC) 06/24/2022   CKD stage 3a, GFR 45-59 ml/min (HCC) 06/21/2022   Acute systolic CHF (congestive heart failure) (HCC) 06/21/2022   Obesity (BMI 30-39.9) 06/21/2022   Cocaine use 06/21/2022   Snoring 12/10/2018   Acute pulmonary edema (HCC) 02/12/2018   Chronic atrial fibrillation (HCC) 02/12/2018   Hypertensive urgency 02/12/2018   Essential hypertension 10/04/2014   PCP:  Oneita Hurt, No Pharmacy:   [id number] - [place] ([place]), [place] - [address]  ([address]) [place] [address] Phone: [phone] Fax: [phone]  [id number] [place], [place] -  [address] AT [facility] [address] [place] [address] Phone: [phone] Fax: [phone]  [facility] [address], [place] - [address] AT [address] [place] [address] Phone: [phone] Fax: [phone]     Social Determinants of Health (SDOH) Social History: SDOH Screenings   Food Insecurity: No  Food Insecurity (06/21/2022)  Housing: Medium Risk (06/21/2022)  Transportation Needs: No Transportation Needs (06/21/2022)  Utilities: Not At Risk (06/21/2022)  Tobacco Use: Low Risk  (06/24/2022)   SDOH Interventions:     Readmission Risk Interventions     No data to display

## 2022-06-24 NOTE — Progress Notes (Signed)
  Transition of Care [facility]) Screening Note   Patient Details  Name: James [name] Date of Birth: 14-Mar-1960   Transition of Care [facility]) CM/SW Contact:    Leone Haven, RN Phone Number: 06/24/2022, 11:36 AM    Transition of Care Department [facility]) has reviewed patient. Patient with new onset afib, on amio drip, iv abx, has no PCP.  We will continue to monitor patient advancement through interdisciplinary progression rounds. If new patient transition needs arise, please place a TOC consult.

## 2022-06-24 NOTE — Interval H&P Note (Signed)
History and Physical Interval Note:  06/24/2022 8:19 AM  James [name]  has presented today for surgery, with the diagnosis of afib.  The various methods of treatment have been discussed with the patient and family. After consideration of risks, benefits and other options for treatment, the patient has consented to  Procedure(s): TRANSESOPHAGEAL ECHOCARDIOGRAM (N/A) CARDIOVERSION (N/A) as a surgical intervention.  The patient's history has been reviewed, patient examined, no change in status, stable for surgery.  I have reviewed the patient's chart and labs.  Questions were answered to the patient's satisfaction.     Quinita Kostelecky J Keyerra Lamere

## 2022-06-24 NOTE — Anesthesia Postprocedure Evaluation (Signed)
Anesthesia Post Note  Patient: James [name]  Procedure(s) Performed: TRANSESOPHAGEAL ECHOCARDIOGRAM CARDIOVERSION     Patient location during evaluation: PACU Anesthesia Type: MAC Level of consciousness: awake and alert Pain management: pain level controlled Vital Signs Assessment: post-procedure vital signs reviewed and stable Respiratory status: spontaneous breathing, nonlabored ventilation and respiratory function stable Cardiovascular status: blood pressure returned to baseline and stable Postop Assessment: no apparent nausea or vomiting Anesthetic complications: no   No notable events documented.  Last Vitals:  Vitals:   06/24/22 0912 06/24/22 0917  BP: (!) 143/97 (!) 144/103  Pulse: (!) 41 (!) 46  Resp: 15 15  Temp:    SpO2: 96% 96%    Last Pain:  Vitals:   06/24/22 0900  TempSrc:   PainSc: 0-No pain                 [name]

## 2022-06-24 NOTE — Anesthesia Preprocedure Evaluation (Signed)
Anesthesia Evaluation  Patient identified by MRN, date of birth, ID band Patient awake    Reviewed: Allergy & Precautions, H&P , NPO status , Patient's Chart, lab work & pertinent test results  Airway Mallampati: II  TM Distance: >3 FB Neck ROM: Full    Dental no notable dental hx.    Pulmonary neg pulmonary ROS   Pulmonary exam normal breath sounds clear to auscultation       Cardiovascular hypertension, Pt. on medications +CHF  Normal cardiovascular exam+ dysrhythmias Atrial Fibrillation  Rhythm:Regular Rate:Normal     Neuro/Psych negative neurological ROS  negative psych ROS   GI/Hepatic negative GI ROS, Neg liver ROS,,,  Endo/Other  negative endocrine ROS    Renal/GU Renal InsufficiencyRenal disease  negative genitourinary   Musculoskeletal negative musculoskeletal ROS (+)    Abdominal  (+) + obese  Peds negative pediatric ROS (+)  Hematology negative hematology ROS (+)   Anesthesia Other Findings   Reproductive/Obstetrics negative OB ROS                             Anesthesia Physical Anesthesia Plan  ASA: 3  Anesthesia Plan: MAC   Post-op Pain Management:    Induction: Intravenous  PONV Risk Score and Plan: Treatment may vary due to age or medical condition  Airway Management Planned: Nasal Cannula  Additional Equipment:   Intra-op Plan:   Post-operative Plan:   Informed Consent: I have reviewed the patients History and Physical, chart, labs and discussed the procedure including the risks, benefits and alternatives for the proposed anesthesia with the patient or authorized representative who has indicated his/her understanding and acceptance.     Dental advisory given  Plan Discussed with: CRNA  Anesthesia Plan Comments:        Anesthesia Quick Evaluation

## 2022-06-24 NOTE — Anesthesia Procedure Notes (Signed)
Procedure Name: MAC Date/Time: 06/24/2022 8:34 AM  Performed by: Caren Macadam, CRNAPre-anesthesia Checklist: Patient identified, Emergency Drugs available, Suction available, Patient being monitored and Timeout performed Oxygen Delivery Method: Simple face mask Preoxygenation: Pre-oxygenation with 100% oxygen Induction Type: IV induction

## 2022-06-24 NOTE — Progress Notes (Signed)
Ventricular rate in 40s, significant amount of spontaneous echo contrast in LA/LAA. Therefore, I decided to defer cardioversion at this time.  His ventricular rate is running in the 30s.  Stop IV amiodarone, and p.o. metoprolol.  Given the spontaneous echo contrast and prothrombotic state, I would not attempt rhythm control anytime soon. It looks his Afib is truly persistent and longstanding with slow ventricular response.  Agree with all other heart failure meds. I think he will need at least another day of diuresis. If renal function improves, at some point, we can consider getting cardiac MRI for etiology of heart failure.     Elder Negus, MD Pager: [phone] Office: [phone]

## 2022-06-24 NOTE — Transfer of Care (Signed)
Immediate Anesthesia Transfer of Care Note  Patient: James [name]  Procedure(s) Performed: TRANSESOPHAGEAL ECHOCARDIOGRAM CARDIOVERSION  Patient Location: Cath Lab  Anesthesia Type:MAC  Level of Consciousness: drowsy  Airway & Oxygen Therapy: Patient Spontanous Breathing and Patient connected to nasal cannula oxygen  Post-op Assessment: Report given to RN and Post -op Vital signs reviewed and stable  Post vital signs: Reviewed and stable  Last Vitals:  Vitals Value Taken Time  BP    Temp    Pulse    Resp    SpO2      Last Pain:  Vitals:   06/24/22 0730  TempSrc: Temporal  PainSc: 0-No pain         Complications: No notable events documented.

## 2022-06-24 NOTE — Progress Notes (Addendum)
PROGRESS NOTE    James [name]  RUE:[id number] DOB: [birth year] DOA: 06/20/2022 PCP: Pcp, No  61/M with hypertension, chronic systolic CHF, NICM, chronic A-fib not on anticoagulation, obesity presented to the ED with worsening dyspnea, orthopnea and lower extremity edema.  Has been taking some of his blood pressure medicines intermittently, has not seen a provider in 3+ years -In the ER hypertensive, hemoglobin 12.2, creatinine 1.8, BNP 561, chest x-ray with cardiomegaly and mild pulmonary edema, EKG noted A-fib -Admitted, improving on diuretics, started on amiodarone for A-fib -4/30, TEE cardioversion was attempted however heart rate in the 40s with significant spontaneous contrast in left atrium,  Subjective: -Feels okay, concerned that he could not be cardioverted, right ankle pain and swelling is improving  Assessment and Plan:  Acute on chronic systolic CHF -Echo in 2019 noted EF of 45%, mild mitral regurgitation -Cath 2019 noted mild nonobstructive CAD -Lost to follow-up -2D echo with EF 40-45%, low normal RV, moderately dilated left atrium  -Volume status has improved, changed to oral Lasix yesterday, cards following  -Continue BiDil and Jardiance  -Discharge planning, home tomorrow if stable  CKD stage 3a, GFR 45-59 ml/min (HCC) Last Scr was 1.25 in 01/2020.   -No recent baseline, hold ACE inhibitor - bump in creatinine yesterday, unknown baseline, suspect might be in the twos, improving, continue oral Lasix today  P. atrial fibrillation (HCC) Chronic afib. Not on systemic anticoagulation. Has been [facility] in the past.  -Continue Eliquis -Cards following, attempted TEE/DCCV, noted to have significant amount of spontaneous echo contrast and left atrium, deferred cardioversion, metoprolol and amiodarone discontinued, heart rate was down to 40s  Essential hypertension -As above, needs PCP  Right ankle gout flare -Improving, repeat prednisone dose  today  Cocaine use Admits to cocaine use about 1 month ago prior to going to music concert with his friends. States he does not use cocaine often.  -UDS negative  Obesity (BMI 30-39.9) Chronic.   DVT prophylaxis: apixaban Code Status: Full code Family Communication: None present Disposition Plan: Home tomorrow if stable  Consultants: Cards   Procedures:   Antimicrobials:    Objective: Vitals:   06/24/22 0900 06/24/22 0912 06/24/22 0917 06/24/22 1138  BP: 125/76 (!) 143/97 (!) 144/103 (!) 155/82  Pulse: (!) 47 (!) 41 (!) 46 (!) 43  Resp: 18 15 15 20   Temp: 98.5 F (36.9 C)   (!) 97.5 F (36.4 C)  TempSrc:    Oral  SpO2: 98% 96% 96% 94%  Weight:      Height:        Intake/Output Summary (Last 24 hours) at 06/24/2022 1202 Last data filed at 06/24/2022 1138 Gross per 24 hour  Intake 598 ml  Output 1700 ml  Net -1102 ml   Filed Weights   06/22/22 0500 06/23/22 0500 06/24/22 0441  Weight: 130.7 kg 128.4 kg 128.7 kg    Examination:  General exam: Pleasant male sitting up in bed, AAOx3 HEENT: No JVD CVS: S1-S2, irregularly irregular rhythm Lungs: Decreased breath sounds at the bases Abdomen: Soft, nontender, bowel sounds present Skin: No rashes Psychiatry:  Mood & affect appropriate.     Data Reviewed:   CBC: Recent Labs  Lab 06/20/22 1240 06/22/22 0039  WBC 4.3 5.8  HGB 12.2* 10.1*  HCT 40.1 30.7*  MCV 85.0 80.8  PLT 159 154   Basic Metabolic Panel: Recent Labs  Lab 06/20/22 1240 06/22/22 0039 06/23/22 0100 06/24/22 0058  NA 138 136 135 132*  K 3.6  4.1 3.7 3.9  CL 106 104 102 101  CO2 21* 24 24 23   GLUCOSE 111* 103* 92 115*  BUN 35* 36* 40* 45*  CREATININE 1.87* 2.19* 2.47* 2.27*  CALCIUM 8.5* 8.2* 8.1* 8.4*   GFR: Estimated Creatinine Clearance: 46.8 mL/min (A) (by C-G formula based on SCr of 2.27 mg/dL (H)). Liver Function Tests: Recent Labs  Lab 06/22/22 0039  AST 20  ALT 20  ALKPHOS 58  BILITOT 0.9  PROT 7.2  ALBUMIN  2.7*   No results for input(s): "LIPASE", "AMYLASE" in the last 168 hours. No results for input(s): "AMMONIA" in the last 168 hours. Coagulation Profile: Recent Labs  Lab 06/23/22 2049  INR 1.5*   Cardiac Enzymes: No results for input(s): "CKTOTAL", "CKMB", "CKMBINDEX", "TROPONINI" in the last 168 hours. BNP (last 3 results) No results for input(s): "PROBNP" in the last 8760 hours. HbA1C: No results for input(s): "HGBA1C" in the last 72 hours.  CBG: No results for input(s): "GLUCAP" in the last 168 hours. Lipid Profile: No results for input(s): "CHOL", "HDL", "LDLCALC", "TRIG", "CHOLHDL", "LDLDIRECT" in the last 72 hours. Thyroid Function Tests: No results for input(s): "TSH", "T4TOTAL", "FREET4", "T3FREE", "THYROIDAB" in the last 72 hours. Anemia Panel: No results for input(s): "VITAMINB12", "FOLATE", "FERRITIN", "TIBC", "IRON", "RETICCTPCT" in the last 72 hours. Urine analysis:    Component Value Date/Time   COLORURINE YELLOW 06/21/2022 0146   APPEARANCEUR HAZY (A) 06/21/2022 0146   LABSPEC 1.006 06/21/2022 0146   PHURINE 5.0 06/21/2022 0146   GLUCOSEU NEGATIVE 06/21/2022 0146   HGBUR LARGE (A) 06/21/2022 0146   BILIRUBINUR NEGATIVE 06/21/2022 0146   KETONESUR NEGATIVE 06/21/2022 0146   PROTEINUR NEGATIVE 06/21/2022 0146   NITRITE NEGATIVE 06/21/2022 0146   LEUKOCYTESUR SMALL (A) 06/21/2022 0146   Sepsis Labs: @LABRCNTIP (procalcitonin:4,lacticidven:4)  )No results found for this or any previous visit (from the past 240 hour(s)).   Radiology Studies: ECHO TEE  Result Date: 06/24/2022    TRANSESOPHOGEAL ECHO REPORT   Patient Name:   James [name] Date of Exam: 06/24/2022 Medical Rec #:  [id number]             Height:       72.0 in Accession #:    [id number]            Weight:       283.7 lb Date of Birth:  11/19/1960            BSA:          2.472 m Patient Age:    61 years              BP:           172/89 mmHg Patient Gender: M                     HR:            57 bpm. Exam Location:  Inpatient Procedure: Transesophageal Echo, Cardiac Doppler and Color Doppler Indications:     atrial fibrillation  History:         Patient has prior history of Echocardiogram examinations, most                  recent 06/22/2022. Chronic kidney disease, Arrythmias:Atrial                  Fibrillation; Risk Factors:Hypertension.  Sonographer:     Delcie Roch RDCS Referring Phys:  [id number] [facility] J PATWARDHAN Diagnosing Phys: Evlyn Clines  Patwardhan MD PROCEDURE: After discussion of the risks and benefits of a TEE, an informed consent was obtained from the patient. The transesophogeal probe was passed without difficulty through the esophogus of the patient. Imaged were obtained with the patient in a left lateral decubitus position. Sedation performed by different physician. The patient was monitored while under deep sedation. Anesthestetic sedation was provided intravenously by Anesthesiology: 297mg  of Propofol, 50mg  of Lidocaine. The patient developed no complications during the procedure. Cardioversion not performed due to significant amount of spontaneous echo contrast in LA/LAA, concerning for prothrombotic state.  IMPRESSIONS  1. Left ventricular ejection fraction, by estimation, is 40 to 45%. The left ventricle has mildly decreased function.  2. Right ventricular systolic function is low normal. The right ventricular size is normal.  3. Left atrial size was moderately dilated. No left atrial/left atrial appendage thrombus was detected. The LAA emptying velocity was 17 cm/s.  4. Right atrial size was moderately dilated.  5. The mitral valve is grossly normal. Mild to moderate mitral valve regurgitation.  6. Tricuspid valve regurgitation is mild to moderate.  7. The aortic valve is normal in structure. Aortic valve regurgitation is not visualized. No aortic stenosis is present.  8. Cardioversion not performed due to significant amount of spontaneous echo contrast in LA/LAA, concerning for  prothrombotic state. FINDINGS  Left Ventricle: Left ventricular ejection fraction, by estimation, is 40 to 45%. The left ventricle has mildly decreased function. The left ventricular internal cavity size was normal in size. Right Ventricle: The right ventricular size is normal. No increase in right ventricular wall thickness. Right ventricular systolic function is low normal. Left Atrium: Left atrial size was moderately dilated. No left atrial/left atrial appendage thrombus was detected. The LAA emptying velocity was 17 cm/s. Right Atrium: Right atrial size was moderately dilated. Pericardium: There is no evidence of pericardial effusion. Mitral Valve: The mitral valve is grossly normal. Mild to moderate mitral valve regurgitation. Tricuspid Valve: The tricuspid valve is grossly normal. Tricuspid valve regurgitation is mild to moderate. Aortic Valve: The aortic valve is normal in structure. Aortic valve regurgitation is not visualized. No aortic stenosis is present. Pulmonic Valve: The pulmonic valve was normal in structure. Pulmonic valve regurgitation is not visualized. No evidence of pulmonic stenosis. Aorta: The aortic root and ascending aorta are structurally normal, with no evidence of dilitation. IAS/Shunts: No atrial level shunt detected by color flow Doppler. Truett Mainland MD Electronically signed by Truett Mainland MD Signature Date/Time: 06/24/2022/9:24:08 AM    Final    EP STUDY  Result Date: 06/24/2022 See surgical note for result.  ECHOCARDIOGRAM COMPLETE  Result Date: 06/23/2022    ECHOCARDIOGRAM REPORT   Patient Name:   James [name] Date of Exam: 06/22/2022 Medical Rec #:  [id number]             Height:       71.0 in Accession #:    [id number]            Weight:       288.1 lb Date of Birth:  [birth year]            BSA:          2.463 m Patient Age:    61 years              BP:           162/97 mmHg Patient Gender: M  HR:           54 bpm. Exam Location:   Inpatient Procedure: 2D Echo, 3D Echo, Cardiac Doppler, Color Doppler and Strain Analysis Indications:    CHF-Acute Systolic I50.21  History:        Patient has prior history of Echocardiogram examinations, most                 recent 02/12/2018. CHF and Cardiomegaly, CKD 3; Cocaine use,                 Arrythmias:Atrial Fibrillation, Signs/Symptoms:Edema, Shortness                 of Breath and Chest Pain; Risk Factors:Hypertension, Non-Smoker                 and Dyslipidemia.  Sonographer:    Dondra Prader RVT RCS Referring Phys: [id number] ERIC CHEN  Sonographer Comments: Patient is obese. Image acquisition challenging due to patient body habitus. IMPRESSIONS  1. Left ventricular ejection fraction, by estimation, is 45 to 50%. Left ventricular ejection fraction by 3D volume is 58 %. Left ventricular ejection fraction by 2D MOD biplane is 54.0 %. Left ventricular ejection fraction by PLAX is 43 %. The left ventricle has mildly decreased function. The left ventricle demonstrates global hypokinesis. Left ventricular diastolic parameters are consistent with Grade II diastolic dysfunction (pseudonormalization).  2. Right ventricular systolic function is low normal. The right ventricular size is mildly enlarged.  3. Left atrial size was mildly dilated.  4. Right atrial size was moderately dilated.  5. The mitral valve is grossly normal. Mild mitral valve regurgitation.  6. The aortic valve has an indeterminant number of cusps. Aortic valve regurgitation is trivial.  7. Aortic dilatation noted. There is mild dilatation of the ascending aorta, measuring 39 mm.  8. The inferior vena cava is dilated in size with <50% respiratory variability, suggesting right atrial pressure of 15 mmHg. FINDINGS  Left Ventricle: Left ventricular ejection fraction, by estimation, is 45 to 50%. Left ventricular ejection fraction by PLAX is 43 %. Left ventricular ejection fraction by 2D MOD biplane is 54.0 %. Left ventricular ejection fraction by 3D  volume is 58 %.  The left ventricle has mildly decreased function. The left ventricle demonstrates global hypokinesis. The left ventricular internal cavity size was normal in size. There is no left ventricular hypertrophy. Left ventricular diastolic parameters are consistent with Grade II diastolic dysfunction (pseudonormalization). Right Ventricle: The right ventricular size is mildly enlarged. No increase in right ventricular wall thickness. Right ventricular systolic function is low normal. Left Atrium: Left atrial size was mildly dilated. Right Atrium: Right atrial size was moderately dilated. Pericardium: There is no evidence of pericardial effusion. Mitral Valve: The mitral valve is grossly normal. Mild mitral valve regurgitation. Tricuspid Valve: The tricuspid valve is grossly normal. Tricuspid valve regurgitation is trivial. Aortic Valve: The aortic valve has an indeterminant number of cusps. Aortic valve regurgitation is trivial. Aortic valve mean gradient measures 2.5 mmHg. Aortic valve peak gradient measures 5.2 mmHg. Aortic valve area, by VTI measures 2.69 cm. Pulmonic Valve: The pulmonic valve was grossly normal. Pulmonic valve regurgitation is not visualized. Aorta: The aortic root is normal in size and structure and aortic dilatation noted. There is mild dilatation of the ascending aorta, measuring 39 mm. Venous: The inferior vena cava is dilated in size with less than 50% respiratory variability, suggesting right atrial pressure of 15 mmHg. IAS/Shunts: No atrial level shunt detected by color  flow Doppler.  LEFT VENTRICLE PLAX 2D                        Biplane EF (MOD) LV EF:         Left            LV Biplane EF:   Left                ventricular                      ventricular                ejection                         ejection                fraction by                      fraction by                PLAX is 43                       2D MOD                %.                                biplane is LVIDd:         5.10 cm                          54.0 %. LVIDs:         4.00 cm LV PW:         1.40 cm         Diastology LV IVS:        1.40 cm         LV e' medial:    4.80 cm/s LVOT diam:     2.20 cm         LV E/e' medial:  25.6 LV SV:         59              LV e' lateral:   6.45 cm/s LV SV Index:   24              LV E/e' lateral: 19.1 LVOT Area:     3.80 cm                                 3D Volume EF LV Volumes (MOD)               LV 3D EF:    Left LV vol d, MOD    258.0 ml                   ventricul A2C:                                        ar LV vol d, MOD    229.0 ml  ejection A4C:                                        fraction LV vol s, MOD    108.0 ml                   by 3D A2C:                                        volume is LV vol s, MOD    114.0 ml                   58 %. A4C: LV SV MOD A2C:   150.0 ml LV SV MOD A4C:   229.0 ml      3D Volume EF: LV SV MOD BP:    132.5 ml      3D EF:        58 %                                LV EDV:       257 ml                                LV ESV:       107 ml                                LV SV:        150 ml RIGHT VENTRICLE            IVC RV Basal diam:  4.80 cm    IVC diam: 3.10 cm RV Mid diam:    3.80 cm RV S prime:     9.38 cm/s TAPSE (M-mode): 1.6 cm LEFT ATRIUM              Index        RIGHT ATRIUM           Index LA diam:        5.00 cm  2.03 cm/m   RA Area:     27.00 cm LA Vol (A2C):   128.0 ml 51.98 ml/m  RA Volume:   95.90 ml  38.94 ml/m LA Vol (A4C):   99.2 ml  40.26 ml/m LA Biplane Vol: 126.0 ml 51.17 ml/m  AORTIC VALVE                    PULMONIC VALVE AV Area (Vmax):    2.81 cm     PV Vmax:       0.96 m/s AV Area (Vmean):   2.72 cm     PV Peak grad:  3.7 mmHg AV Area (VTI):     2.69 cm AV Vmax:           113.95 cm/s AV Vmean:          74.600 cm/s AV VTI:            0.219 m AV Peak Grad:      5.2 mmHg AV Mean Grad:      2.5 mmHg LVOT Vmax:  84.20 cm/s LVOT Vmean:        53.300 cm/s LVOT VTI:           0.155 m LVOT/AV VTI ratio: 0.71  AORTA Ao Root diam: 3.60 cm Ao Asc diam:  3.90 cm MITRAL VALVE MV Area (PHT): 4.31 cm     SHUNTS MV Decel Time: 176 msec     Systemic VTI:  0.16 m MV E velocity: 123.00 cm/s  Systemic Diam: 2.20 cm Sabina Custovic Electronically signed by Clotilde Dieter Signature Date/Time: 06/23/2022/12:46:52 PM    Final      Scheduled Meds:  apixaban  5 mg Oral BID   empagliflozin  10 mg Oral Daily   furosemide  60 mg Oral BID   guaiFENesin  600 mg Oral BID   isosorbide-hydrALAZINE  2 tablet Oral TID   potassium chloride  20 mEq Oral Daily   sodium chloride flush  3 mL Intravenous Q12H   Continuous Infusions:  sodium chloride       LOS: 3 days    Time spent:   Zannie Cove, MD [facility]   06/24/2022, 12:02 PM

## 2022-06-24 NOTE — Plan of Care (Signed)

## 2022-06-25 ENCOUNTER — Encounter (HOSPITAL_COMMUNITY): Payer: Self-pay | Admitting: Cardiology

## 2022-06-25 ENCOUNTER — Other Ambulatory Visit (HOSPITAL_COMMUNITY): Payer: Self-pay

## 2022-06-25 DIAGNOSIS — I5033 Acute on chronic diastolic (congestive) heart failure: Secondary | ICD-10-CM

## 2022-06-25 DIAGNOSIS — N179 Acute kidney failure, unspecified: Secondary | ICD-10-CM

## 2022-06-25 DIAGNOSIS — N189 Chronic kidney disease, unspecified: Secondary | ICD-10-CM

## 2022-06-25 DIAGNOSIS — I4819 Other persistent atrial fibrillation: Secondary | ICD-10-CM

## 2022-06-25 DIAGNOSIS — M109 Gout, unspecified: Secondary | ICD-10-CM

## 2022-06-25 DIAGNOSIS — I10 Essential (primary) hypertension: Secondary | ICD-10-CM | POA: Diagnosis not present

## 2022-06-25 LAB — BASIC METABOLIC PANEL
Anion gap: 8 (ref 5–15)
BUN: 45 mg/dL — ABNORMAL HIGH (ref 8–23)
CO2: 24 mmol/L (ref 22–32)
Calcium: 8.6 mg/dL — ABNORMAL LOW (ref 8.9–10.3)
Chloride: 100 mmol/L (ref 98–111)
Creatinine, Ser: 2.11 mg/dL — ABNORMAL HIGH (ref 0.61–1.24)
GFR, Estimated: 35 mL/min — ABNORMAL LOW (ref >60)
Glucose, Bld: 145 mg/dL — ABNORMAL HIGH (ref 70–99)
Potassium: 4.1 mmol/L (ref 3.5–5.1)
Sodium: 132 mmol/L — ABNORMAL LOW (ref 135–145)

## 2022-06-25 LAB — CBC
HCT: 34.9 % — ABNORMAL LOW (ref 39.0–52.0)
Hemoglobin: 11 g/dL — ABNORMAL LOW (ref 13.0–17.0)
MCH: 25.6 pg — ABNORMAL LOW (ref 26.0–34.0)
MCHC: 31.5 g/dL (ref 30.0–36.0)
MCV: 81.4 fL (ref 80.0–100.0)
Platelets: 198 10*3/uL (ref 150–400)
RBC: 4.29 MIL/uL (ref 4.22–5.81)
RDW: 16.2 % — ABNORMAL HIGH (ref 11.5–15.5)
WBC: 6.1 10*3/uL (ref 4.0–10.5)
nRBC: 0 % (ref 0.0–0.2)

## 2022-06-25 MED ORDER — APIXABAN 5 MG PO TABS
5.0000 mg | ORAL_TABLET | Freq: Two times a day (BID) | ORAL | 0 refills | Status: DC
  Filled 2022-06-25: qty 60, 30d supply, fill #0

## 2022-06-25 MED ORDER — FUROSEMIDE 20 MG PO TABS
60.0000 mg | ORAL_TABLET | Freq: Every day | ORAL | 0 refills | Status: DC
  Filled 2022-06-25: qty 30, 5d supply, fill #0

## 2022-06-25 MED ORDER — ISOSORB DINITRATE-HYDRALAZINE 20-37.5 MG PO TABS
2.0000 | ORAL_TABLET | Freq: Three times a day (TID) | ORAL | 0 refills | Status: AC
  Filled 2022-06-25: qty 180, 30d supply, fill #0

## 2022-06-25 MED ORDER — EMPAGLIFLOZIN 10 MG PO TABS
10.0000 mg | ORAL_TABLET | Freq: Every day | ORAL | 0 refills | Status: DC
  Filled 2022-06-25: qty 30, 30d supply, fill #0

## 2022-06-25 NOTE — Assessment & Plan Note (Addendum)
CKD stage 3a. Hyponatremia,   Patient tolerated well diuresis, at the time of his discharge his serum cr is 2,11 with K at 4,1 and serum bicarbonate at 24. Na is 132   Patient will continue diuresis with furosemide 60 mg po daily and plan to increase to bid in case of volume overload. Plan to continue SGLT 2 inh.  Follow up as outpatient renal function and electrolytes in 7 days.

## 2022-06-25 NOTE — Progress Notes (Signed)
Subjective:  Patient seen and examined at bedside, resting comfortably. No evernt overnight. He is feeling better. Denies chest pain, shortness of breath, palpitations, diaphoresis, syncope, edema, PND, orthopnea. He is comfortable going home today.   Intake/Output from previous day:  I/O last 3 completed shifts: In: 1092.1 [P.O.:676; I.V.:416.1] Out: 1975 [Urine:1975] Total I/O In: 237 [P.O.:237] Out: -  Net IO Since Admission: -4,187.69 mL [06/25/22 1030]  Blood pressure (!) 181/84, pulse (!) 54, temperature 97.7 F (36.5 C), temperature source Oral, resp. rate 19, height 6' (1.829 m), weight 129.3 kg, SpO2 93 %. Physical Exam Vitals reviewed.  HENT:     Head: Normocephalic and atraumatic.  Cardiovascular:     Rate and Rhythm: Normal rate. Rhythm irregular.     Pulses: Normal pulses.     Heart sounds: Normal heart sounds. No murmur heard. Pulmonary:     Effort: Pulmonary effort is normal.     Breath sounds: Normal breath sounds.  Abdominal:     General: Bowel sounds are normal.  Musculoskeletal:     Right lower leg: Edema (trace) present.     Left lower leg: Edema (trace) present.  Skin:    General: Skin is warm and dry.  Neurological:     Mental Status: He is alert.     Lab Results: Lab Results  Component Value Date   NA 132 (L) 06/25/2022   K 4.1 06/25/2022   CO2 24 06/25/2022   GLUCOSE 145 (H) 06/25/2022   BUN 45 (H) 06/25/2022   CREATININE 2.11 (H) 06/25/2022   CALCIUM 8.6 (L) 06/25/2022   GFRNONAA 35 (L) 06/25/2022    BNP (last 3 results) Recent Labs    06/20/22 1240  BNP 561.7*    ProBNP (last 3 results) No results for input(s): "PROBNP" in the last 8760 hours.    Latest Ref Rng & Units 06/25/2022   12:43 AM 06/24/2022   12:58 AM 06/23/2022    1:00 AM  BMP  Glucose 70 - 99 mg/dL 213  086  92   BUN 8 - 23 mg/dL 45  45  40   Creatinine 0.61 - 1.24 mg/dL 5.78  4.69  6.29   Sodium 135 - 145 mmol/L 132  132  135   Potassium 3.5 - 5.1 mmol/L 4.1  3.9   3.7   Chloride 98 - 111 mmol/L 100  101  102   CO2 22 - 32 mmol/L 24  23  24    Calcium 8.9 - 10.3 mg/dL 8.6  8.4  8.1       Latest Ref Rng & Units 06/22/2022   12:39 AM 10/06/2014   12:22 PM  Hepatic Function  Total Protein 6.5 - 8.1 g/dL 7.2  7.4   Albumin 3.5 - 5.0 g/dL 2.7  4.1   AST 15 - 41 U/L 20  19   ALT 0 - 44 U/L 20  22   Alk Phosphatase 38 - 126 U/L 58  54   Total Bilirubin 0.3 - 1.2 mg/dL 0.9  0.9       Latest Ref Rng & Units 06/25/2022   12:43 AM 06/22/2022   12:39 AM 06/20/2022   12:40 PM  CBC  WBC 4.0 - 10.5 K/uL 6.1  5.8  4.3   Hemoglobin 13.0 - 17.0 g/dL 52.8  41.3  24.4   Hematocrit 39.0 - 52.0 % 34.9  30.7  40.1   Platelets 150 - 400 K/uL 198  154  159    Lipid Panel  Component Value Date/Time   CHOL 223 (H) 10/06/2014 1222   TRIG 151 (H) 10/06/2014 1222   HDL 29 (L) 10/06/2014 1222   CHOLHDL 7.7 (H) 10/06/2014 1222   VLDL 30 10/06/2014 1222   LDLCALC 164 (H) 10/06/2014 1222   Cardiac Panel (last 3 results) No results for input(s): "CKTOTAL", "CKMB", "TROPONINI", "RELINDX" in the last 72 hours.  HEMOGLOBIN A1C Lab Results  Component Value Date   HGBA1C 6.1 (H) 06/21/2022   MPG 128.37 06/21/2022   TSH No results for input(s): "TSH" in the last 8760 hours. Imaging: Imaging results have been reviewed and ABORTED INVASIVE LAB PROCEDURE  Result Date: 06/24/2022 See surgical note for result.  ECHO TEE  Result Date: 06/24/2022    TRANSESOPHOGEAL ECHO REPORT   Patient Name:   James [name] Date of Exam: 06/24/2022 Medical Rec #:  [id number]             Height:       72.0 in Accession #:    [id number]            Weight:       283.7 lb Date of Birth:  Sep 18, 1960            BSA:          2.472 m Patient Age:    61 years              BP:           172/89 mmHg Patient Gender: M                     HR:           57 bpm. Exam Location:  Inpatient Procedure: Transesophageal Echo, Cardiac Doppler and Color Doppler Indications:     atrial fibrillation   History:         Patient has prior history of Echocardiogram examinations, most                  recent 06/22/2022. Chronic kidney disease, Arrythmias:Atrial                  Fibrillation; Risk Factors:Hypertension.  Sonographer:     Delcie Roch RDCS Referring Phys:  [id number] [facility] J PATWARDHAN Diagnosing Phys: Truett Mainland MD PROCEDURE: After discussion of the risks and benefits of a TEE, an informed consent was obtained from the patient. The transesophogeal probe was passed without difficulty through the esophogus of the patient. Imaged were obtained with the patient in a left lateral decubitus position. Sedation performed by different physician. The patient was monitored while under deep sedation. Anesthestetic sedation was provided intravenously by Anesthesiology: 297mg  of Propofol, 50mg  of Lidocaine. The patient developed no complications during the procedure. Cardioversion not performed due to significant amount of spontaneous echo contrast in LA/LAA, concerning for prothrombotic state.  IMPRESSIONS  1. Left ventricular ejection fraction, by estimation, is 40 to 45%. The left ventricle has mildly decreased function.  2. Right ventricular systolic function is low normal. The right ventricular size is normal.  3. Left atrial size was moderately dilated. No left atrial/left atrial appendage thrombus was detected. The LAA emptying velocity was 17 cm/s.  4. Right atrial size was moderately dilated.  5. The mitral valve is grossly normal. Mild to moderate mitral valve regurgitation.  6. Tricuspid valve regurgitation is mild to moderate.  7. The aortic valve is normal in structure. Aortic valve regurgitation is not visualized. No aortic stenosis is present.  8. Cardioversion not performed due to significant amount of spontaneous echo contrast in LA/LAA, concerning for prothrombotic state. FINDINGS  Left Ventricle: Left ventricular ejection fraction, by estimation, is 40 to 45%. The left ventricle has mildly  decreased function. The left ventricular internal cavity size was normal in size. Right Ventricle: The right ventricular size is normal. No increase in right ventricular wall thickness. Right ventricular systolic function is low normal. Left Atrium: Left atrial size was moderately dilated. No left atrial/left atrial appendage thrombus was detected. The LAA emptying velocity was 17 cm/s. Right Atrium: Right atrial size was moderately dilated. Pericardium: There is no evidence of pericardial effusion. Mitral Valve: The mitral valve is grossly normal. Mild to moderate mitral valve regurgitation. Tricuspid Valve: The tricuspid valve is grossly normal. Tricuspid valve regurgitation is mild to moderate. Aortic Valve: The aortic valve is normal in structure. Aortic valve regurgitation is not visualized. No aortic stenosis is present. Pulmonic Valve: The pulmonic valve was normal in structure. Pulmonic valve regurgitation is not visualized. No evidence of pulmonic stenosis. Aorta: The aortic root and ascending aorta are structurally normal, with no evidence of dilitation. IAS/Shunts: No atrial level shunt detected by color flow Doppler. Truett Mainland MD Electronically signed by Truett Mainland MD Signature Date/Time: 06/24/2022/9:24:08 AM    Final    EP STUDY  Result Date: 06/24/2022 See surgical note for result.   Cardiac Studies:  EKG 04/21/2018: Atrial fibrillation with controlled ventricular response. Anterolateral ST-elevation -repolarization variant.    Echocardiogram 02/12/2018: - Left ventricle: The cavity size was normal. There was moderate   concentric hypertrophy. Systolic function was mildly reduced. The   estimated ejection fraction was in the range of 45% to 50%. Wall   motion was normal; there were no regional wall motion   abnormalities. Unable to evaluate diastolic function due to   atrial fibrillation. - Mitral valve: Moderately dilated annulus. There was mild   regurgitation. -  Left atrium: The atrium was severely dilated. - Right ventricle: Systolic function was low normal. - Right atrium: The atrium was mildly dilated. - No evidence of pulmonary hypertension.   Cath 02/12/2018: LM: Distal 20% disease LAD: Ostial 20% disease Ramus: No significant disease LCx: Dominant. No significant disease RCA: Nondominant. No significant disease LVEDP normal Conclusion: Mild nonobstructive coronary artery disease Type 2 MI in the setting of hypertensive urgency.    EKG: 06/20/2022: Atrial fibrillation with CVR. Criteria for left ventricular hypertrophy met. Borderline prolonged QT interval. No evidence of ischemia.   Tele: Afib CVR    Recent Results (from the past 16109 hour(s))  ECHO TEE   Collection Time: 06/24/22  9:00 AM  Result Value   Est EF 40 - 45%   Narrative      TRANSESOPHOGEAL ECHO REPORT       Patient Name:   James [name] Date of Exam: 06/24/2022 Medical Rec #:  [id number]             Height:       72.0 in Accession #:    [id number]            Weight:       283.7 lb Date of Birth:  [birth year]            BSA:          2.472 m Patient Age:    61 years              BP:  172/89 mmHg Patient Gender: M                     HR:           57 bpm. Exam Location:  Inpatient  Procedure: Transesophageal Echo, Cardiac Doppler and Color Doppler  Indications:     atrial fibrillation   History:         Patient has prior history of Echocardiogram examinations, most                  recent 06/22/2022. Chronic kidney disease, Arrythmias:Atrial                  Fibrillation; Risk Factors:Hypertension.   Sonographer:     Delcie Roch RDCS Referring Phys:  [id number] [facility] J PATWARDHAN Diagnosing Phys: Truett Mainland MD  PROCEDURE: After discussion of the risks and benefits of a TEE, an informed consent was obtained from the patient. The transesophogeal probe was passed without difficulty through the esophogus of the patient. Imaged were  obtained with the patient in a  left lateral decubitus position. Sedation performed by different physician. The patient was monitored while under deep sedation. Anesthestetic sedation was provided intravenously by Anesthesiology: 297mg  of Propofol, 50mg  of Lidocaine. The patient  developed no complications during the procedure. Cardioversion not performed due to significant amount of spontaneous echo contrast in LA/LAA, concerning for prothrombotic state.   IMPRESSIONS    1. Left ventricular ejection fraction, by estimation, is 40 to 45%. The left ventricle has mildly decreased function.  2. Right ventricular systolic function is low normal. The right ventricular size is normal.  3. Left atrial size was moderately dilated. No left atrial/left atrial appendage thrombus was detected. The LAA emptying velocity was 17 cm/s.  4. Right atrial size was moderately dilated.  5. The mitral valve is grossly normal. Mild to moderate mitral valve regurgitation.  6. Tricuspid valve regurgitation is mild to moderate.  7. The aortic valve is normal in structure. Aortic valve regurgitation is not visualized. No aortic stenosis is present.  8. Cardioversion not performed due to significant amount of spontaneous echo contrast in LA/LAA, concerning for prothrombotic state.  FINDINGS  Left Ventricle: Left ventricular ejection fraction, by estimation, is 40 to 45%. The left ventricle has mildly decreased function. The left ventricular internal cavity size was normal in size.  Right Ventricle: The right ventricular size is normal. No increase in right ventricular wall thickness. Right ventricular systolic function is low normal.  Left Atrium: Left atrial size was moderately dilated. No left atrial/left atrial appendage thrombus was detected. The LAA emptying velocity was 17 cm/s.  Right Atrium: Right atrial size was moderately dilated.  Pericardium: There is no evidence of pericardial effusion.  Mitral Valve:  The mitral valve is grossly normal. Mild to moderate mitral valve regurgitation.  Tricuspid Valve: The tricuspid valve is grossly normal. Tricuspid valve regurgitation is mild to moderate.  Aortic Valve: The aortic valve is normal in structure. Aortic valve regurgitation is not visualized. No aortic stenosis is present.  Pulmonic Valve: The pulmonic valve was normal in structure. Pulmonic valve regurgitation is not visualized. No evidence of pulmonic stenosis.  Aorta: The aortic root and ascending aorta are structurally normal, with no evidence of dilitation.  IAS/Shunts: No atrial level shunt detected by color flow Doppler.  Truett Mainland MD Electronically signed by Truett Mainland MD Signature Date/Time: 06/24/2022/9:24:08 AM       Final     *Note: Due to  a large number of results and/or encounters for the requested time period, some results have not been displayed. A complete set of results can be found in Results Review.    Scheduled Meds:  apixaban  5 mg Oral BID   empagliflozin  10 mg Oral Daily   furosemide  60 mg Oral BID   guaiFENesin  600 mg Oral BID   isosorbide-hydrALAZINE  2 tablet Oral TID   potassium chloride  20 mEq Oral Daily   sodium chloride flush  3 mL Intravenous Q12H   Continuous Infusions:  sodium chloride     PRN Meds:.sodium chloride, acetaminophen, ondansetron (ZOFRAN) IV, sodium chloride flush  Assessment & Plan James [name] is a 62 y.o. male patient with Afib CVR and AECHF    Afib/flutter CVR, at times SVR Currently on Eliquis TEE performed but DCCV was not done due to ventricular rate in 40s, significant amount of spontaneous echo contrast in LA/LAA. Amiodarone and metoprolol have been discontinued. I will refer him for atrial flutter ablation with EP in the future.      Acute on chronic combined systolic and diastolic heart failure, currently compensated Creatinine has been stable, d/c on current Lasix dose. Patient is on  GDMT, continue Jardiance, Bidil. Once Cr more stable, we can start Entresto or Losartan. I will set him up for Zoll heart failure monitor. He has follow-up in office with me Monday 5/6 at 10 am.     Hypertension: Continue current medications Will add additional anti-hypertensives in outpatient setting  Stable for d/c from cardiac standpoint, discussed with primary team.    Clotilde Dieter, DO 06/25/2022, 10:30 AM Office: [phone] Fax: [phone] Pager: [phone]

## 2022-06-25 NOTE — Progress Notes (Signed)
Mobility Specialist Progress Note:   06/25/22 1030  Mobility  Activity Ambulated independently in hallway  Level of Assistance Independent  Assistive Device None  Distance Ambulated (ft) 500 ft  Activity Response Tolerated well  Mobility Referral Yes  $Mobility charge 1 Mobility   Pt agreeable to mobility session. Required no physical assistance throughout. SpO2 WFL on RA, pt displayed minor SOB. Back sitting EOB with all needs met.   Addison Lank Mobility Specialist Please contact via SecureChat or  Rehab office at [phone]

## 2022-06-25 NOTE — Assessment & Plan Note (Signed)
Patient was placed on amiodarone for rate control and direct current cardioversion was attempted.   Aborted cardioversion due to low heart rate in the 40 and significant amount of spontaneous contrast in left atrium and left atrial appendage.  Amiodarone was discontinued.  Patient will continue anticoagulation with apixaban. Plan to follow up as outpatient.  At the time of his discharge he continue in atrial fibrillation with rate of 50 to 60 bpm.

## 2022-06-25 NOTE — Discharge Summary (Signed)
Physician Discharge Summary   Patient: James [name] MRN: [id number] DOB: 08-26-1960  Admit date:     06/20/2022  Discharge date: 06/25/22  Discharge Physician: York Ram Jenson Beedle   PCP: Pcp, No   Recommendations at discharge:    Patient has been placed on heart failure regimen with hydralazine/ isosorbide.  Diuresis with furosemide and SGLT 2 inh Instructed to take twice daily furosemide in case of weight gain 2 to 3 lbs in 24 hrs or 5 lbs in 7 days.  Follow up with primary care in 7 to 10 days.  Follow up Cardiology as scheduled.   Discharge Diagnoses: Principal Problem:   Acute systolic CHF (congestive heart failure) (HCC) Active Problems:   Essential hypertension   Chronic atrial fibrillation (HCC)   CKD stage 3a, GFR 45-59 ml/min (HCC)   Obesity (BMI 30-39.9)   Cocaine use   Persistent atrial fibrillation (HCC)  Resolved Problems:   * No resolved hospital problems. [facility] Course: James [name] was admitted to the hospital with the working diagnosis of heart failure decompensation.   62 yo male with the past medical history of hypertension, atrial fibrillation and obesity who presented with dyspnea. Reported one week of dyspnea on exertion, lower extremity edema, orthopnea and PND. He has not been compliant with his medications. On his initial physical examination his blood pressure was 176/103, HR 86, RR 23 and 02 saturation 100%, lungs with rales bilaterally, heart with S1 and S2 present, irregularly irregular, abdomen with no distention and positive lower extremity edema.   Na 136, K 3,6 Cl 106, bicarbonate 21, glucose 111, bun 35 cr 1,87  BNP 561  High sensitive troponin 72  Wbc 4,3 hgb 12.1 plt 159   Urine analysis SG 1,006, negative protein, small leukocytes. 21-50 wbc, large hgb, 11-20 rbc.   Chest radiograph with cardiomegaly, bilateral hilar vascular congestion, bilateral interstitial infiltrates and small bilateral pleural effusions.   EKG 78  bpm, normal axis, normal intervals, atrial fibrillation rhythm with no significant ST segment or T wave changes, positive LVH.   Patient was placed on IV furosemide for diuresis.  IV amiodarone for rate control.  04/30 TEE cardioversion attempted, but aborted due to heart rate 40's and significant spontaneous contrast in the left atrium and left atrial appendage.   Assessment and Plan: * Acute systolic CHF (congestive heart failure) (HCC) Admit to cardiac telemetry. Start diuresis with IV lasix 60 mg q12h. Check echo. Hold on betablocker due to acute CHF. Pt will need new PCP. Hold ARB/ACEI until echo. May need entresto. Pt is a long distance truck driver. Last EF in 01/2018 was 45-50%.  Patient does not have pneumonia.  Antibiotics discontinued.  CKD stage 3a, GFR 45-59 ml/min (HCC) Last Scr was 1.25 in 01/2020.  Today, Scr 1.87. unclear if this is new or old. Hold on ACEI/ARB for now until echo results.  Chronic atrial fibrillation (HCC) Chronic afib. Not on systemic anticoagulation. Has been [facility] in the past. [place] VASC score of 2. Hold on anticoagulation for now. Keep serum K > 4.0 and Mg >2.0  Essential hypertension Pt still taking meds from 2021. Does not see healthcare provider on a regular basis. 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  Cocaine use Admits to cocaine use about 1 month ago prior to going to music concert with his friends. States he does not use cocaine often. Check UDS. Hold on betablockers.  Obesity (BMI 30-39.9) Chronic.  Consultants: cardiology  Procedures performed: attempted cardioversion  Disposition: Home Diet recommendation:  Cardiac diet DISCHARGE MEDICATION: Allergies as of 06/25/2022   No Known Allergies      Medication List     STOP taking these medications    amLODipine 10 MG tablet Commonly known as: NORVASC   benazepril 20 MG tablet Commonly known as: LOTENSIN       TAKE these  medications    apixaban 5 MG Tabs tablet Commonly known as: ELIQUIS Take 1 tablet (5 mg total) by mouth 2 (two) times daily.   empagliflozin 10 MG Tabs tablet Commonly known as: JARDIANCE Take 1 tablet (10 mg total) by mouth daily. Start taking on: Jun 26, 2022   furosemide 20 MG tablet Commonly known as: LASIX Take 3 tablets (60 mg total) by mouth daily. Take 3 tablets twice daily in case of weight gain 2 to 3 lbs in 24 hr or 5 lbs in 7 days.   isosorbide-hydrALAZINE 20-37.5 MG tablet Commonly known as: BIDIL Take 2 tablets by mouth 3 (three) times daily.        Discharge Exam: Filed Weights   06/23/22 0500 06/24/22 0441 06/25/22 0405  Weight: 128.4 kg 128.7 kg 129.3 kg   BP (!) 181/84 (BP Location: Left Arm)   Pulse (!) 54   Temp 97.7 F (36.5 C) (Oral)   Resp 19   Ht 6' (1.829 m)   Wt 129.3 kg   SpO2 93%   BMI 38.67 kg/m   Patient is feeling batter, edema, PND and orthopnea have resolved.   Neurology awake and alert ENT with mild pallor Cardiovascular with S1 and S2 present and rhythmic with no gallops, rubs or murmurs No JVD No lower extremity edema Respiratory with no rales or wheezing Abdomen with no distention   Condition at discharge: stable  The results of significant diagnostics from this hospitalization (including imaging, microbiology, ancillary and laboratory) are listed below for reference.   Imaging Studies: ABORTED INVASIVE LAB PROCEDURE  Result Date: 06/24/2022 See surgical note for result.  ECHO TEE  Result Date: 06/24/2022    TRANSESOPHOGEAL ECHO REPORT   Patient Name:   James [name] Date of Exam: 06/24/2022 Medical Rec #:  [id number]             Height:       72.0 in Accession #:    [id number]            Weight:       283.7 lb Date of Birth:  Sep 13, 1960            BSA:          2.472 m Patient Age:    61 years              BP:           172/89 mmHg Patient Gender: M                     HR:           57 bpm. Exam Location:   Inpatient Procedure: Transesophageal Echo, Cardiac Doppler and Color Doppler Indications:     atrial fibrillation  History:         Patient has prior history of Echocardiogram examinations, most                  recent 06/22/2022. Chronic kidney disease, Arrythmias:Atrial  Fibrillation; Risk Factors:Hypertension.  Sonographer:     Delcie Roch RDCS Referring Phys:  [id number] [facility] J PATWARDHAN Diagnosing Phys: Truett Mainland MD PROCEDURE: After discussion of the risks and benefits of a TEE, an informed consent was obtained from the patient. The transesophogeal probe was passed without difficulty through the esophogus of the patient. Imaged were obtained with the patient in a left lateral decubitus position. Sedation performed by different physician. The patient was monitored while under deep sedation. Anesthestetic sedation was provided intravenously by Anesthesiology: 297mg  of Propofol, 50mg  of Lidocaine. The patient developed no complications during the procedure. Cardioversion not performed due to significant amount of spontaneous echo contrast in LA/LAA, concerning for prothrombotic state.  IMPRESSIONS  1. Left ventricular ejection fraction, by estimation, is 40 to 45%. The left ventricle has mildly decreased function.  2. Right ventricular systolic function is low normal. The right ventricular size is normal.  3. Left atrial size was moderately dilated. No left atrial/left atrial appendage thrombus was detected. The LAA emptying velocity was 17 cm/s.  4. Right atrial size was moderately dilated.  5. The mitral valve is grossly normal. Mild to moderate mitral valve regurgitation.  6. Tricuspid valve regurgitation is mild to moderate.  7. The aortic valve is normal in structure. Aortic valve regurgitation is not visualized. No aortic stenosis is present.  8. Cardioversion not performed due to significant amount of spontaneous echo contrast in LA/LAA, concerning for prothrombotic state.  FINDINGS  Left Ventricle: Left ventricular ejection fraction, by estimation, is 40 to 45%. The left ventricle has mildly decreased function. The left ventricular internal cavity size was normal in size. Right Ventricle: The right ventricular size is normal. No increase in right ventricular wall thickness. Right ventricular systolic function is low normal. Left Atrium: Left atrial size was moderately dilated. No left atrial/left atrial appendage thrombus was detected. The LAA emptying velocity was 17 cm/s. Right Atrium: Right atrial size was moderately dilated. Pericardium: There is no evidence of pericardial effusion. Mitral Valve: The mitral valve is grossly normal. Mild to moderate mitral valve regurgitation. Tricuspid Valve: The tricuspid valve is grossly normal. Tricuspid valve regurgitation is mild to moderate. Aortic Valve: The aortic valve is normal in structure. Aortic valve regurgitation is not visualized. No aortic stenosis is present. Pulmonic Valve: The pulmonic valve was normal in structure. Pulmonic valve regurgitation is not visualized. No evidence of pulmonic stenosis. Aorta: The aortic root and ascending aorta are structurally normal, with no evidence of dilitation. IAS/Shunts: No atrial level shunt detected by color flow Doppler. Truett Mainland MD Electronically signed by Truett Mainland MD Signature Date/Time: 06/24/2022/9:24:08 AM    Final    EP STUDY  Result Date: 06/24/2022 See surgical note for result.  ECHOCARDIOGRAM COMPLETE  Result Date: 06/23/2022    ECHOCARDIOGRAM REPORT   Patient Name:   James [name] Date of Exam: 06/22/2022 Medical Rec #:  [id number]             Height:       71.0 in Accession #:    [id number]            Weight:       288.1 lb Date of Birth:  03/19/1960            BSA:          2.463 m Patient Age:    61 years              BP:  162/97 mmHg Patient Gender: M                     HR:           54 bpm. Exam Location:  Inpatient Procedure: 2D  Echo, 3D Echo, Cardiac Doppler, Color Doppler and Strain Analysis Indications:    CHF-Acute Systolic I50.21  History:        Patient has prior history of Echocardiogram examinations, most                 recent 02/12/2018. CHF and Cardiomegaly, CKD 3; Cocaine use,                 Arrythmias:Atrial Fibrillation, Signs/Symptoms:Edema, Shortness                 of Breath and Chest Pain; Risk Factors:Hypertension, Non-Smoker                 and Dyslipidemia.  Sonographer:    Dondra Prader RVT RCS Referring Phys: [id number] ERIC CHEN  Sonographer Comments: Patient is obese. Image acquisition challenging due to patient body habitus. IMPRESSIONS  1. Left ventricular ejection fraction, by estimation, is 45 to 50%. Left ventricular ejection fraction by 3D volume is 58 %. Left ventricular ejection fraction by 2D MOD biplane is 54.0 %. Left ventricular ejection fraction by PLAX is 43 %. The left ventricle has mildly decreased function. The left ventricle demonstrates global hypokinesis. Left ventricular diastolic parameters are consistent with Grade II diastolic dysfunction (pseudonormalization).  2. Right ventricular systolic function is low normal. The right ventricular size is mildly enlarged.  3. Left atrial size was mildly dilated.  4. Right atrial size was moderately dilated.  5. The mitral valve is grossly normal. Mild mitral valve regurgitation.  6. The aortic valve has an indeterminant number of cusps. Aortic valve regurgitation is trivial.  7. Aortic dilatation noted. There is mild dilatation of the ascending aorta, measuring 39 mm.  8. The inferior vena cava is dilated in size with <50% respiratory variability, suggesting right atrial pressure of 15 mmHg. FINDINGS  Left Ventricle: Left ventricular ejection fraction, by estimation, is 45 to 50%. Left ventricular ejection fraction by PLAX is 43 %. Left ventricular ejection fraction by 2D MOD biplane is 54.0 %. Left ventricular ejection fraction by 3D volume is 58 %.  The left  ventricle has mildly decreased function. The left ventricle demonstrates global hypokinesis. The left ventricular internal cavity size was normal in size. There is no left ventricular hypertrophy. Left ventricular diastolic parameters are consistent with Grade II diastolic dysfunction (pseudonormalization). Right Ventricle: The right ventricular size is mildly enlarged. No increase in right ventricular wall thickness. Right ventricular systolic function is low normal. Left Atrium: Left atrial size was mildly dilated. Right Atrium: Right atrial size was moderately dilated. Pericardium: There is no evidence of pericardial effusion. Mitral Valve: The mitral valve is grossly normal. Mild mitral valve regurgitation. Tricuspid Valve: The tricuspid valve is grossly normal. Tricuspid valve regurgitation is trivial. Aortic Valve: The aortic valve has an indeterminant number of cusps. Aortic valve regurgitation is trivial. Aortic valve mean gradient measures 2.5 mmHg. Aortic valve peak gradient measures 5.2 mmHg. Aortic valve area, by VTI measures 2.69 cm. Pulmonic Valve: The pulmonic valve was grossly normal. Pulmonic valve regurgitation is not visualized. Aorta: The aortic root is normal in size and structure and aortic dilatation noted. There is mild dilatation of the ascending aorta, measuring 39 mm. Venous: The inferior vena cava  is dilated in size with less than 50% respiratory variability, suggesting right atrial pressure of 15 mmHg. IAS/Shunts: No atrial level shunt detected by color flow Doppler.  LEFT VENTRICLE PLAX 2D                        Biplane EF (MOD) LV EF:         Left            LV Biplane EF:   Left                ventricular                      ventricular                ejection                         ejection                fraction by                      fraction by                PLAX is 43                       2D MOD                %.                               biplane is LVIDd:         5.10  cm                          54.0 %. LVIDs:         4.00 cm LV PW:         1.40 cm         Diastology LV IVS:        1.40 cm         LV e' medial:    4.80 cm/s LVOT diam:     2.20 cm         LV E/e' medial:  25.6 LV SV:         59              LV e' lateral:   6.45 cm/s LV SV Index:   24              LV E/e' lateral: 19.1 LVOT Area:     3.80 cm                                 3D Volume EF LV Volumes (MOD)               LV 3D EF:    Left LV vol d, MOD    258.0 ml                   ventricul A2C:  ar LV vol d, MOD    229.0 ml                   ejection A4C:                                        fraction LV vol s, MOD    108.0 ml                   by 3D A2C:                                        volume is LV vol s, MOD    114.0 ml                   58 %. A4C: LV SV MOD A2C:   150.0 ml LV SV MOD A4C:   229.0 ml      3D Volume EF: LV SV MOD BP:    132.5 ml      3D EF:        58 %                                LV EDV:       257 ml                                LV ESV:       107 ml                                LV SV:        150 ml RIGHT VENTRICLE            IVC RV Basal diam:  4.80 cm    IVC diam: 3.10 cm RV Mid diam:    3.80 cm RV S prime:     9.38 cm/s TAPSE (M-mode): 1.6 cm LEFT ATRIUM              Index        RIGHT ATRIUM           Index LA diam:        5.00 cm  2.03 cm/m   RA Area:     27.00 cm LA Vol (A2C):   128.0 ml 51.98 ml/m  RA Volume:   95.90 ml  38.94 ml/m LA Vol (A4C):   99.2 ml  40.26 ml/m LA Biplane Vol: 126.0 ml 51.17 ml/m  AORTIC VALVE                    PULMONIC VALVE AV Area (Vmax):    2.81 cm     PV Vmax:       0.96 m/s AV Area (Vmean):   2.72 cm     PV Peak grad:  3.7 mmHg AV Area (VTI):     2.69 cm AV Vmax:           113.95 cm/s AV Vmean:          74.600 cm/s AV VTI:  0.219 m AV Peak Grad:      5.2 mmHg AV Mean Grad:      2.5 mmHg LVOT Vmax:         84.20 cm/s LVOT Vmean:        53.300 cm/s LVOT VTI:          0.155 m LVOT/AV VTI ratio:  0.71  AORTA Ao Root diam: 3.60 cm Ao Asc diam:  3.90 cm MITRAL VALVE MV Area (PHT): 4.31 cm     SHUNTS MV Decel Time: 176 msec     Systemic VTI:  0.16 m MV E velocity: 123.00 cm/s  Systemic Diam: 2.20 cm Rozell Searing Custovic Electronically signed by Clotilde Dieter Signature Date/Time: 06/23/2022/12:46:52 PM    Final    DG Chest 2 View  Result Date: 06/20/2022 CLINICAL DATA:  Shortness of breath EXAM: CHEST - 2 VIEW COMPARISON:  CXR 04/18/21 FINDINGS: Cardiomegaly. Small right pleural effusion. No pneumothorax. Possible retrocardiac opacity. There are prominent bilateral interstitial opacities, which may be related to mild pulmonary edema or atypical infection. No radiographically apparent displaced rib fractures. Visualized upper abdomen is unremarkable. Vertebral body heights are maintained. IMPRESSION: 1. Cardiomegaly with small right pleural effusion and mild pulmonary edema. 2. Possible retrocardiac opacity, which could represent atelectasis or pneumonia. Electronically Signed   By: Lorenza Cambridge M.D.   On: 06/20/2022 13:05    Microbiology: Results for orders placed or performed during the hospital encounter of 04/18/21  Resp Panel by RT-PCR (Flu A&B, Covid) Nasopharyngeal Swab     Status: Abnormal   Collection Time: 04/18/21  2:19 PM   Specimen: Nasopharyngeal Swab; Nasopharyngeal(NP) swabs in vial transport medium  Result Value Ref Range Status   SARS Coronavirus 2 by RT PCR NEGATIVE NEGATIVE Final    Comment: (NOTE) SARS-CoV-2 target nucleic acids are NOT DETECTED.  The SARS-CoV-2 RNA is generally detectable in upper respiratory specimens during the acute phase of infection. The lowest concentration of SARS-CoV-2 viral copies this assay can detect is 138 copies/mL. A negative result does not preclude SARS-Cov-2 infection and should not be used as the sole basis for treatment or other patient management decisions. A negative result may occur with  improper specimen collection/handling,  submission of specimen other than nasopharyngeal swab, presence of viral mutation(s) within the areas targeted by this assay, and inadequate number of viral copies(<138 copies/mL). A negative result must be combined with clinical observations, patient history, and epidemiological information. The expected result is Negative.  Fact Sheet for Patients:  BloggerCourse.com  Fact Sheet for Healthcare Providers:  SeriousBroker.it  This test is no t yet approved or cleared by the [place] FDA and  has been authorized for detection and/or diagnosis of SARS-CoV-2 by FDA under an Emergency Use Authorization (EUA). This EUA will remain  in effect (meaning this test can be used) for the duration of the COVID-19 declaration under Section 564(b)(1) of the Act, 21 U.S.C.section 360bbb-3(b)(1), unless the authorization is terminated  or revoked sooner.       Influenza A by PCR POSITIVE (A) NEGATIVE Final   Influenza B by PCR NEGATIVE NEGATIVE Final    Comment: (NOTE) The Xpert Xpress SARS-CoV-2/FLU/RSV plus assay is intended as an aid in the diagnosis of influenza from Nasopharyngeal swab specimens and should not be used as a sole basis for treatment. Nasal washings and aspirates are unacceptable for Xpert Xpress SARS-CoV-2/FLU/RSV testing.  Fact Sheet for Patients: BloggerCourse.com  Fact Sheet for Healthcare Providers: SeriousBroker.it  This test is not yet approved  or cleared by the [place] and has been authorized for detection and/or diagnosis of SARS-CoV-2 by FDA under an Emergency Use Authorization (EUA). This EUA will remain in effect (meaning this test can be used) for the duration of the COVID-19 declaration under Section 564(b)(1) of the Act, 21 U.S.C. section 360bbb-3(b)(1), unless the authorization is terminated or revoked.  Performed at [facility],  [address], [place], [place] [address]     Labs: CBC: Recent Labs  Lab 06/20/22 1240 06/22/22 0039 06/25/22 0043  WBC 4.3 5.8 6.1  HGB 12.2* 10.1* 11.0*  HCT 40.1 30.7* 34.9*  MCV 85.0 80.8 81.4  PLT 159 154 198   Basic Metabolic Panel: Recent Labs  Lab 06/20/22 1240 06/22/22 0039 06/23/22 0100 06/24/22 0058 06/25/22 0043  NA 138 136 135 132* 132*  K 3.6 4.1 3.7 3.9 4.1  CL 106 104 102 101 100  CO2 21* 24 24 23 24   GLUCOSE 111* 103* 92 115* 145*  BUN 35* 36* 40* 45* 45*  CREATININE 1.87* 2.19* 2.47* 2.27* 2.11*  CALCIUM 8.5* 8.2* 8.1* 8.4* 8.6*   Liver Function Tests: Recent Labs  Lab 06/22/22 0039  AST 20  ALT 20  ALKPHOS 58  BILITOT 0.9  PROT 7.2  ALBUMIN 2.7*   CBG: No results for input(s): "GLUCAP" in the last 168 hours.  Discharge time spent: greater than 30 minutes.  Signed: Coralie Keens, MD [facility] 06/25/2022

## 2022-06-25 NOTE — Hospital Course (Signed)
Mr. Eddings was admitted to the hospital with the working diagnosis of heart failure decompensation.   62 yo male with the past medical history of hypertension, atrial fibrillation and obesity who presented with dyspnea. Reported one week of dyspnea on exertion, lower extremity edema, orthopnea and PND. He has not been compliant with his medications. On his initial physical examination his blood pressure was 176/103, HR 86, RR 23 and 02 saturation 100%, lungs with rales bilaterally, heart with S1 and S2 present, irregularly irregular, abdomen with no distention and positive lower extremity edema.   Na 136, K 3,6 Cl 106, bicarbonate 21, glucose 111, bun 35 cr 1,87  BNP 561  High sensitive troponin 72  Wbc 4,3 hgb 12.1 plt 159   Urine analysis SG 1,006, negative protein, small leukocytes. 21-50 wbc, large hgb, 11-20 rbc.   Chest radiograph with cardiomegaly, bilateral hilar vascular congestion, bilateral interstitial infiltrates and small bilateral pleural effusions.   EKG 78 bpm, normal axis, normal intervals, atrial fibrillation rhythm with no significant ST segment or T wave changes, positive LVH.   Patient was placed on IV furosemide for diuresis.  IV amiodarone for rate control.  04/30 TEE cardioversion attempted, but aborted due to heart rate 40's and significant spontaneous contrast in the left atrium and left atrial appendage.

## 2022-06-25 NOTE — TOC Transition Note (Addendum)
Transition of Care [facility]) - CM/SW Discharge Note   Patient Details  Name: James [name] MRN: [id number] Date of Birth: 10-05-1960  [facility] CM/SW Contact:  Leone Haven, RN Phone Number: 06/25/2022, 11:09 AM   Clinical Narrative:    Patient is for dc today, he states he has transport home , per MD he will need a scale,  NCM asked the HF Navigator if she could get a scale for him, she is checking to see.  Patient has hospital follow up apt on AVS.  Patient will be on eliquis, copay is 15.00 for eliquis.  Will give him the 10 co pay card also.  Patient asked how to get in touch with disability, NCM informed him to go to DSS  and they can help him with disability. Patient states he will buy him a scale.  Patient told Staff Nurse Lamelva that the doctors office will call him on Monday and send a scale to him.     Barriers to Discharge: Continued Medical Work up   Patient Goals and CMS Choice   Choice offered to / list presented to : NA  Discharge Placement                         Discharge Plan and Services Additional resources added to the After Visit Summary for   In-house Referral: NA Discharge Planning Services: CM Consult Post Acute Care Choice: NA          DME Arranged: N/A DME Agency: NA       HH Arranged: NA          Social Determinants of Health (SDOH) Interventions SDOH Screenings   Food Insecurity: No Food Insecurity (06/21/2022)  Housing: Medium Risk (06/21/2022)  Transportation Needs: No Transportation Needs (06/21/2022)  Utilities: Not At Risk (06/21/2022)  Tobacco Use: Low Risk  (06/25/2022)     Readmission Risk Interventions     No data to display

## 2022-06-25 NOTE — Care Plan (Addendum)
Pt's b/p systolic's in the 180s. RN made Dr. Ella Jubilee aware by secure chat. Awaiting new orders    Per Dr. Marin Comment verbalization, 1600 b/p medication was given early to 1314pm.

## 2022-06-25 NOTE — Assessment & Plan Note (Signed)
Patient had one dose of prednisone with improvement in ankle pain. Plan to follow up as outpatient.

## 2022-06-26 ENCOUNTER — Telehealth: Payer: Self-pay

## 2022-06-26 NOTE — Telephone Encounter (Signed)
Location of hospitalization: [place] Reason for hospitalization: SOB Date of discharge: 06/25/2022 Date of first communication with patient: today Person contacting patient: Me Current symptoms: leg swollen Do you understand why you were in the Hospital: Yes Questions regarding discharge instructions: None Where were you discharged to: Home Medications reviewed: Yes Allergies reviewed: Yes Dietary changes reviewed: Yes. Discussed low fat and low salt diet.  Referals reviewed: NA Activities of Daily Living: Able to with mild limitations Any transportation issues/concerns: None Any patient concerns: None Confirmed importance & date/time of Follow up appt: Yes Confirmed with patient if condition begins to worsen call. Pt was given the office number and encouraged to call back with questions or concerns: Yes

## 2022-06-30 ENCOUNTER — Encounter: Payer: Self-pay | Admitting: Internal Medicine

## 2022-06-30 ENCOUNTER — Ambulatory Visit: Payer: BLUE CROSS/BLUE SHIELD | Admitting: Cardiology

## 2022-06-30 ENCOUNTER — Ambulatory Visit: Payer: BLUE CROSS/BLUE SHIELD | Admitting: Internal Medicine

## 2022-06-30 VITALS — BP 172/96 | HR 72 | Ht 72.0 in | Wt 281.0 lb

## 2022-06-30 DIAGNOSIS — N1832 Chronic kidney disease, stage 3b: Secondary | ICD-10-CM

## 2022-06-30 DIAGNOSIS — I5043 Acute on chronic combined systolic (congestive) and diastolic (congestive) heart failure: Secondary | ICD-10-CM

## 2022-06-30 DIAGNOSIS — I10 Essential (primary) hypertension: Secondary | ICD-10-CM

## 2022-06-30 DIAGNOSIS — I482 Chronic atrial fibrillation, unspecified: Secondary | ICD-10-CM

## 2022-06-30 MED ORDER — ROSUVASTATIN CALCIUM 20 MG PO TABS: 20.0000 mg | ORAL_TABLET | Freq: Every day | ORAL | 3 refills | Status: DC

## 2022-06-30 MED ORDER — AMLODIPINE BESYLATE 10 MG PO TABS: 10.0000 mg | ORAL_TABLET | Freq: Every day | ORAL | 3 refills | Status: DC

## 2022-06-30 MED ORDER — FUROSEMIDE 80 MG PO TABS: 80.0000 mg | ORAL_TABLET | Freq: Two times a day (BID) | ORAL | 3 refills | Status: DC

## 2022-06-30 NOTE — Progress Notes (Signed)
Primary Physician/Referring:  Pcp, No  Patient ID: James [name], male    DOB: 1960-09-03, 62 y.o.   MRN: [id number]  Chief Complaint  Patient presents with   Shortness of Breath   Follow-up   Congestive Heart Failure   Edema   HPI:    James [name]  is a 62 y.o. male with past medical history significant for acute on chronic combined systolic and diastolic heart failure, persistent atrial fibrillation and CKD who was recently hospitalized for heart failure exacerbation. TEE was done with intent to cardiovert however there was significant smoke in his LAA and LA so decision was made to abort cardioversion. We will focus on rate control for now and I have referred him to EP for future ablation. He still has swelling in his ankles but he is wearing compression stockings and they are helping. His blood pressure pills make him tired but understands that he has to take them so his kidneys and heart do not get any worse. He is wearing the zoll heart failure monitor and is not experiencing any issues with it. 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 Patient is compliant with medications and agreeable to very close follow-up with me. Denies chest pain, shortness of breath, palpitations, diaphoresis, syncope, PND, orthopnea.   Past Medical History:  Diagnosis Date   A-fib [facility])    Hypercholesteremia    Hypertension    Past Surgical History:  Procedure Laterality Date   CARDIOVERSION N/A 06/24/2022   Procedure: CARDIOVERSION;  Surgeon: Elder Negus, MD;  Location: [facility] INVASIVE CV LAB;  Service: Cardiovascular;  Laterality: N/A;   LEFT HEART CATH AND CORONARY ANGIOGRAPHY N/A 02/12/2018   Procedure: LEFT HEART CATH AND CORONARY ANGIOGRAPHY;  Surgeon: Elder Negus, MD;  Location: [facility] INVASIVE CV LAB;  Service: Cardiovascular;  Laterality: N/A;   TEE WITHOUT CARDIOVERSION N/A 06/24/2022    Procedure: TRANSESOPHAGEAL ECHOCARDIOGRAM;  Surgeon: Elder Negus, MD;  Location: [facility] INVASIVE CV LAB;  Service: Cardiovascular;  Laterality: N/A;   Family History  Problem Relation Age of Onset   Diabetes Mother    CAD Brother     Social History   Tobacco Use   Smoking status: Never   Smokeless tobacco: Never  Substance Use Topics   Alcohol use: Not Currently    Alcohol/week: 16.0 standard drinks of alcohol    Types: 16 Shots of liquor per week   Marital Status: Legally Separated  ROS  Review of Systems  Cardiovascular:  Positive for leg swelling. Negative for chest pain, orthopnea and palpitations.  Respiratory:  Positive for cough.    Objective  Blood pressure (!) 172/96, pulse 72, height 6' (1.829 m), weight 281 lb (127.5 kg), SpO2 98 %. Body mass index is 38.11 kg/m.     06/30/2022   10:08 AM 06/30/2022   10:00 AM 06/25/2022   11:29 AM  Vitals with BMI  Height  6\' 0"    Weight  281 lbs   BMI  38.1   Systolic 172 194 696  Diastolic 96 100 95  Pulse 72 82      Physical Exam Vitals reviewed.  HENT:     Head: Normocephalic and atraumatic.  Cardiovascular:     Rate and Rhythm: Normal rate. Rhythm irregular.     Pulses: Normal pulses.     Heart sounds: Normal heart sounds. No murmur heard. Pulmonary:  Effort: Pulmonary effort is normal.     Breath sounds: Normal breath sounds.  Abdominal:     General: Bowel sounds are normal.  Musculoskeletal:     Right lower leg: Edema (pedal) present.     Left lower leg: Edema (pedal) present.  Skin:    General: Skin is warm and dry.  Neurological:     Mental Status: He is alert.     Medications and allergies  No Known Allergies   Medication list after today's encounter   Current Outpatient Medications:    amLODipine (NORVASC) 10 MG tablet, Take 1 tablet (10 mg total) by mouth daily., Disp: 90 tablet, Rfl: 3   apixaban (ELIQUIS) 5 MG TABS tablet, Take 1 tablet (5 mg total) by mouth 2 (two) times daily., Disp:  60 tablet, Rfl: 0   empagliflozin (JARDIANCE) 10 MG TABS tablet, Take 1 tablet (10 mg total) by mouth daily., Disp: 30 tablet, Rfl: 0   furosemide (LASIX) 80 MG tablet, Take 1 tablet (80 mg total) by mouth 2 (two) times daily., Disp: 180 tablet, Rfl: 3   isosorbide-hydrALAZINE (BIDIL) 20-37.5 MG tablet, Take 2 tablets by mouth 3 (three) times daily., Disp: 180 tablet, Rfl: 0   rosuvastatin (CRESTOR) 20 MG tablet, Take 1 tablet (20 mg total) by mouth daily., Disp: 90 tablet, Rfl: 3  Laboratory examination:   Lab Results  Component Value Date   NA 132 (L) 06/25/2022   K 4.1 06/25/2022   CO2 24 06/25/2022   GLUCOSE 145 (H) 06/25/2022   BUN 45 (H) 06/25/2022   CREATININE 2.11 (H) 06/25/2022   CALCIUM 8.6 (L) 06/25/2022   GFRNONAA 35 (L) 06/25/2022       Latest Ref Rng & Units 06/25/2022   12:43 AM 06/24/2022   12:58 AM 06/23/2022    1:00 AM  CMP  Glucose 70 - 99 mg/dL 161  096  92   BUN 8 - 23 mg/dL 45  45  40   Creatinine 0.61 - 1.24 mg/dL 0.45  4.09  8.11   Sodium 135 - 145 mmol/L 132  132  135   Potassium 3.5 - 5.1 mmol/L 4.1  3.9  3.7   Chloride 98 - 111 mmol/L 100  101  102   CO2 22 - 32 mmol/L 24  23  24    Calcium 8.9 - 10.3 mg/dL 8.6  8.4  8.1       Latest Ref Rng & Units 06/25/2022   12:43 AM 06/22/2022   12:39 AM 06/20/2022   12:40 PM  CBC  WBC 4.0 - 10.5 K/uL 6.1  5.8  4.3   Hemoglobin 13.0 - 17.0 g/dL 91.4  78.2  95.6   Hematocrit 39.0 - 52.0 % 34.9  30.7  40.1   Platelets 150 - 400 K/uL 198  154  159     Lipid Panel No results for input(s): "CHOL", "TRIG", "LDLCALC", "VLDL", "HDL", "CHOLHDL", "LDLDIRECT" in the last 8760 hours.  HEMOGLOBIN A1C Lab Results  Component Value Date   HGBA1C 6.1 (H) 06/21/2022   MPG 128.37 06/21/2022   TSH No results for input(s): "TSH" in the last 8760 hours.  External labs:     Radiology:    Cardiac Studies:    Echocardiogram 02/12/2018: - Left ventricle: The cavity size was normal. There was moderate   concentric  hypertrophy. Systolic function was mildly reduced. The   estimated ejection fraction was in the range of 45% to 50%. Wall   motion was normal; there were  no regional wall motion   abnormalities. Unable to evaluate diastolic function due to   atrial fibrillation. - Mitral valve: Moderately dilated annulus. There was mild   regurgitation. - Left atrium: The atrium was severely dilated. - Right ventricle: Systolic function was low normal. - Right atrium: The atrium was mildly dilated. - No evidence of pulmonary hypertension.    Cath 02/12/2018: LM: Distal 20% disease LAD: Ostial 20% disease Ramus: No significant disease LCx: Dominant. No significant disease RCA: Nondominant. No significant disease LVEDP normal Conclusion: Mild nonobstructive coronary artery disease Type 2 MI in the setting of hypertensive urgency.     TEE 06/24/2022  1. Left ventricular ejection fraction, by estimation, is 40 to 45%. The  left ventricle has mildly decreased function.   2. Right ventricular systolic function is low normal. The right  ventricular size is normal.   3. Left atrial size was moderately dilated. No left atrial/left atrial  appendage thrombus was detected. The LAA emptying velocity was 17 cm/s.   4. Right atrial size was moderately dilated.   5. The mitral valve is grossly normal. Mild to moderate mitral valve  regurgitation.   6. Tricuspid valve regurgitation is mild to moderate.   7. The aortic valve is normal in structure. Aortic valve regurgitation is  not visualized. No aortic stenosis is present.   8. Cardioversion not performed due to significant amount of spontaneous  echo contrast in LA/LAA, concerning for prothrombotic state.    ECHO 06/23/2022:  1. Left ventricular ejection fraction, by estimation, is 45 to 50%. Left  ventricular ejection fraction by 3D volume is 58 %. Left ventricular  ejection fraction by 2D MOD biplane is 54.0 %. Left ventricular ejection  fraction by  PLAX is 43 %. The left  ventricle has mildly decreased function. The left ventricle demonstrates  global hypokinesis. Left ventricular diastolic parameters are consistent  with Grade II diastolic dysfunction (pseudonormalization).   2. Right ventricular systolic function is low normal. The right  ventricular size is mildly enlarged.   3. Left atrial size was mildly dilated.   4. Right atrial size was moderately dilated.   5. The mitral valve is grossly normal. Mild mitral valve regurgitation.   6. The aortic valve has an indeterminant number of cusps. Aortic valve  regurgitation is trivial.   7. Aortic dilatation noted. There is mild dilatation of the ascending  aorta, measuring 39 mm.   8. The inferior vena cava is dilated in size with <50% respiratory  variability, suggesting right atrial pressure of 15 mmHg.     EKG:   06/30/2022: Atrial fibrillation with CVR, early repolarization noted  06/20/2022: Atrial fibrillation with CVR. Criteria for left ventricular hypertrophy met. Borderline prolonged QT interval. No evidence of ischemia.  EKG 04/21/2018: Atrial fibrillation with controlled ventricular response. Anterolateral ST-elevation -repolarization variant.   Assessment     ICD-10-CM   1. Chronic atrial fibrillation (HCC)  I48.20 EKG 12-Lead    Ambulatory referral to Cardiac Electrophysiology    Basic metabolic panel    2. Essential hypertension  I10 Basic metabolic panel    3. Acute on chronic combined systolic and diastolic CHF (congestive heart failure) (HCC)  I50.43 Basic metabolic panel    4. Stage 3b chronic kidney disease (HCC)  N18.32 Basic metabolic panel    Ambulatory referral to Nephrology       Orders Placed This Encounter  Procedures   Basic metabolic panel   Ambulatory referral to Cardiac Electrophysiology    Referral  Priority:   Routine    Referral Type:   Consultation    Referral Reason:   Specialty Services Required    Referred to Provider:   Duke Salvia, MD    Requested Specialty:   Cardiology    Number of Visits Requested:   1   Ambulatory referral to Nephrology    Referral Priority:   Routine    Referral Type:   Consultation    Referral Reason:   Specialty Services Required    Requested Specialty:   Nephrology    Number of Visits Requested:   1   EKG 12-Lead    Meds ordered this encounter  Medications   amLODipine (NORVASC) 10 MG tablet    Sig: Take 1 tablet (10 mg total) by mouth daily.    Dispense:  90 tablet    Refill:  3   furosemide (LASIX) 80 MG tablet    Sig: Take 1 tablet (80 mg total) by mouth 2 (two) times daily.    Dispense:  180 tablet    Refill:  3   rosuvastatin (CRESTOR) 20 MG tablet    Sig: Take 1 tablet (20 mg total) by mouth daily.    Dispense:  90 tablet    Refill:  3    Medications Discontinued During This Encounter  Medication Reason   furosemide (LASIX) 20 MG tablet      Recommendations:   James [name] is a 62 y.o.  male with A/C systolic and diastolic heart failure, HTN, CKD, and persistent Afib   Chronic atrial fibrillation (HCC) CHA2DS2VASc = at least 2 Continue Eliquis 5 mg BID DCCV was aborted due to significant smoke in LAA and LA I will refer to EP for ablation in the future He did not tolerate metoprolol as it dropped his rates into the 40s He did not convert on amio gtt, will hold off on rhythm control at this time    Essential hypertension Continue current cardiac medications. Encourage low-sodium diet, less than 2000 mg daily. BP uncontrolled, I will have to put him back on amlodipine 10 mg despite pedal edema since we are limited in what we can use at this time It appears his new baseline Cr is ~2.0 and I would like nephrology input before starting ARB or Entresto   Acute on chronic combined systolic and diastolic CHF (congestive heart failure) (HCC) Continue Jardiance 10 mg qDay I have increased his Lasix to 80 mg qDay Check BMP in 7-10 days I have him  enrolled in [employer] heart failure management system   Stage 3b chronic kidney disease [facility]) Patient will need to establish with nephrology Referral provided     Clotilde Dieter, DO, [facility]  06/30/2022, 10:48 AM Office: [phone] Pager: [phone]

## 2022-07-09 ENCOUNTER — Encounter: Payer: Self-pay | Admitting: Physician Assistant

## 2022-07-09 ENCOUNTER — Ambulatory Visit: Payer: Self-pay | Admitting: *Deleted

## 2022-07-09 ENCOUNTER — Ambulatory Visit: Payer: BLUE CROSS/BLUE SHIELD | Admitting: Physician Assistant

## 2022-07-09 VITALS — BP 142/73 | HR 82 | Ht 72.0 in | Wt 268.0 lb

## 2022-07-09 DIAGNOSIS — M79605 Pain in left leg: Secondary | ICD-10-CM

## 2022-07-09 DIAGNOSIS — M79604 Pain in right leg: Secondary | ICD-10-CM | POA: Diagnosis not present

## 2022-07-09 DIAGNOSIS — I482 Chronic atrial fibrillation, unspecified: Secondary | ICD-10-CM

## 2022-07-09 DIAGNOSIS — M79672 Pain in left foot: Secondary | ICD-10-CM

## 2022-07-09 DIAGNOSIS — I5033 Acute on chronic diastolic (congestive) heart failure: Secondary | ICD-10-CM | POA: Diagnosis not present

## 2022-07-09 DIAGNOSIS — M79671 Pain in right foot: Secondary | ICD-10-CM

## 2022-07-09 MED ORDER — TRAMADOL HCL 50 MG PO TABS
50.0000 mg | ORAL_TABLET | Freq: Three times a day (TID) | ORAL | 0 refills | Status: AC | PRN
Start: 2022-07-09 — End: 2022-07-14

## 2022-07-09 NOTE — Patient Instructions (Signed)
I encourage you to wear your compression stockings during the day regardless if you feel they are offering relief.  Make sure to keep your feet elevated whenever you are sitting.  You can use tramadol every 8 hours as needed to help with the pain.  We will call you with today's lab results, it is important that you keep your appointment with your new primary care provider on the 17th.  Roney Jaffe, PA-C Physician Assistant [facility] [email or web address]   Peripheral Edema  Peripheral edema is swelling that is caused by a buildup of fluid. Peripheral edema most often affects the lower legs, ankles, and feet. It can also develop in the arms, hands, and face. The area of the body that has peripheral edema will look swollen. It may also feel heavy or warm. Your clothes may start to feel tight. Pressing on the area may make a temporary dent in your skin (pitting edema). You may not be able to move your swollen arm or leg as much as usual. There are many causes of peripheral edema. It can happen because of a complication of other conditions such as heart failure, kidney disease, or a problem with your circulation. It also can be a side effect of certain medicines or happen because of an infection. It often happens to women during pregnancy. Sometimes, the cause is not known. Follow these instructions at home: Managing pain, stiffness, and swelling  Raise (elevate) your legs while you are sitting or lying down. Move around often to prevent stiffness and to reduce swelling. Do not sit or stand for long periods of time. Do not wear tight clothing. Do not wear garters on your upper legs. Exercise your legs to get your circulation going. This helps to move the fluid back into your blood vessels, and it may help the swelling go down. Wear compression stockings as told by your health care provider. These stockings help to prevent blood clots and  reduce swelling in your legs. It is important that these are the correct size. These stockings should be prescribed by your doctor to prevent possible injuries. If elastic bandages or wraps are recommended, use them as told by your health care provider. Medicines Take over-the-counter and prescription medicines only as told by your health care provider. Your health care provider may prescribe medicine to help your body get rid of excess water (diuretic). Take this medicine if you are told to take it. General instructions Eat a low-salt (low-sodium) diet as told by your health care provider. Sometimes, eating less salt may reduce swelling. Pay attention to any changes in your symptoms. Moisturize your skin daily to help prevent skin from cracking and draining. Keep all follow-up visits. This is important. Contact a health care provider if: You have a fever. You have swelling in only one leg. You have increased swelling, redness, or pain in one or both of your legs. You have drainage or sores at the area where you have edema. Get help right away if: You have edema that starts suddenly or is getting worse, especially if you are pregnant or have a medical condition. You develop shortness of breath, especially when you are lying down. You have pain in your chest or abdomen. You feel weak. You feel like you will faint. These symptoms may be an emergency. Get help right away. Call 911. Do not wait to see if the symptoms will go away. Do not drive yourself to the hospital. Summary Peripheral edema is swelling that  is caused by a buildup of fluid. Peripheral edema most often affects the lower legs, ankles, and feet. Move around often to prevent stiffness and to reduce swelling. Do not sit or stand for long periods of time. Pay attention to any changes in your symptoms. Contact a health care provider if you have edema that starts suddenly or is getting worse, especially if you are pregnant or have  a medical condition. Get help right away if you develop shortness of breath, especially when lying down. This information is not intended to replace advice given to you by your health care provider. Make sure you discuss any questions you have with your health care provider. Document Revised: 10/15/2020 Document Reviewed: 10/15/2020 Elsevier Patient Education  2023 [employer].

## 2022-07-09 NOTE — Progress Notes (Signed)
New Patient Office Visit  Subjective    Patient ID: James [name], male    DOB: [birth year]  [name]: 62 y.o. MRN: [id number]  CC:  Chief Complaint  Patient presents with   Foot Swelling    Experiencing pain    Joint Swelling    HPI James [name] was seen by cardiology on 06/30/22, note from that visit:   James [name]  is a 62 y.o. male with past medical history significant for acute on chronic combined systolic and diastolic heart failure, persistent atrial fibrillation and CKD who was recently hospitalized for heart failure exacerbation. TEE was done with intent to cardiovert however there was significant smoke in his LAA and LA so decision was made to abort cardioversion. We will focus on rate control for now and I have referred him to EP for future ablation. He still has swelling in his ankles but he is wearing compression stockings and they are helping. His blood pressure pills make him tired but understands that he has to take them so his kidneys and heart do not get any worse. He is wearing the zoll heart failure monitor and is not experiencing any issues with it. 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 Patient is compliant with medications and agreeable to very close follow-up with me. Denies chest pain, shortness of breath, palpitations, diaphoresis, syncope, PND, orthopnea.   James [name] is a 62 y.o.  male with A/C systolic and diastolic heart failure, HTN, CKD, and persistent Afib     Chronic atrial fibrillation (HCC) [id number] = at least 2 Continue Eliquis 5 mg BID DCCV was aborted due to significant smoke in LAA and LA I will refer to EP for ablation in the future He did not tolerate metoprolol as it dropped his rates into the 40s He did not convert on amio gtt, will hold off on rhythm control at this time     Essential hypertension Continue current cardiac  medications. Encourage low-sodium diet, less than 2000 mg daily. BP uncontrolled, I will have to put him back on amlodipine 10 mg despite pedal edema since we are limited in what we can use at this time It appears his new baseline Cr is ~2.0 and I would like nephrology input before starting ARB or Entresto     Acute on chronic combined systolic and diastolic CHF (congestive heart failure) (HCC) Continue Jardiance 10 mg qDay I have increased his Lasix to 80 mg qDay Check BMP in 7-10 days I have him enrolled in [facility] heart failure management system     Stage 3b chronic kidney disease [facility]) Patient will need to establish with nephrology Referral provided        Clotilde Dieter, DO, [facility]  States today that he is taking his medications as directed.  States that he stopped using the compression socks because he did not feel they were offering relief.  States that he continues to have swelling in his feet and ankles and this is causing extreme pain.  States that it is painful to walk.  States that he is only able to use Tylenol and it is not offering relief.    States that he is not weighing himself on a daily basis, states that he does not have a scale. Outpatient Encounter Medications as of 07/09/2022  Medication Sig   amLODipine (NORVASC) 10 MG tablet Take 1  tablet (10 mg total) by mouth daily.   apixaban (ELIQUIS) 5 MG TABS tablet Take 1 tablet (5 mg total) by mouth 2 (two) times daily.   empagliflozin (JARDIANCE) 10 MG TABS tablet Take 1 tablet (10 mg total) by mouth daily.   furosemide (LASIX) 80 MG tablet Take 1 tablet (80 mg total) by mouth 2 (two) times daily.   isosorbide-hydrALAZINE (BIDIL) 20-37.5 MG tablet Take 2 tablets by mouth 3 (three) times daily.   rosuvastatin (CRESTOR) 20 MG tablet Take 1 tablet (20 mg total) by mouth daily.   traMADol (ULTRAM) 50 MG tablet Take 1 tablet (50 mg total) by mouth every 8 (eight) hours as needed for up to 5 days.   No facility-administered  encounter medications on file as of 07/09/2022.    Past Medical History:  Diagnosis Date   A-fib [facility])    Hypercholesteremia    Hypertension     Past Surgical History:  Procedure Laterality Date   CARDIOVERSION N/A 06/24/2022   Procedure: CARDIOVERSION;  Surgeon: Elder Negus, MD;  Location: [facility] INVASIVE CV LAB;  Service: Cardiovascular;  Laterality: N/A;   LEFT HEART CATH AND CORONARY ANGIOGRAPHY N/A 02/12/2018   Procedure: LEFT HEART CATH AND CORONARY ANGIOGRAPHY;  Surgeon: Elder Negus, MD;  Location: [facility] INVASIVE CV LAB;  Service: Cardiovascular;  Laterality: N/A;   TEE WITHOUT CARDIOVERSION N/A 06/24/2022   Procedure: TRANSESOPHAGEAL ECHOCARDIOGRAM;  Surgeon: Elder Negus, MD;  Location: [facility] INVASIVE CV LAB;  Service: Cardiovascular;  Laterality: N/A;    Family History  Problem Relation [name] of Onset   Diabetes Mother    CAD Brother     Social History   Socioeconomic History   Marital status: Legally Separated    Spouse name: Not on file   Number of children: 0   Years of education: Not on file   Highest education level: Not on file  Occupational History   Not on file  Tobacco Use   Smoking status: Never   Smokeless tobacco: Never  Substance and Sexual Activity   Alcohol use: Not Currently    Alcohol/week: 16.0 standard drinks of alcohol    Types: 16 Shots of liquor per week   Drug use: Yes    Types: Cocaine   Sexual activity: Yes  Other Topics Concern   Not on file  Social History Narrative   Not on file   Social Determinants of Health   Financial Resource Strain: Not on file  Food Insecurity: No Food Insecurity (06/21/2022)   Hunger Vital Sign    Worried About Running Out of Food in the Last Year: Never true    Ran Out of Food in the Last Year: Never true  Transportation Needs: No Transportation Needs (06/21/2022)   PRAPARE - Administrator, Civil Service (Medical): No    Lack of Transportation (Non-Medical): No  Physical  Activity: Not on file  Stress: Not on file  Social Connections: Not on file  Intimate Partner Violence: Not At Risk (06/21/2022)   Humiliation, Afraid, Rape, and Kick questionnaire    Fear of Current or Ex-Partner: No    Emotionally Abused: No    Physically Abused: No    Sexually Abused: No    Review of Systems  Constitutional: Negative.   HENT: Negative.    Eyes: Negative.   Respiratory:  Negative for shortness of breath.   Cardiovascular:  Positive for leg swelling. Negative for chest pain and palpitations.  Gastrointestinal: Negative.   Genitourinary: Negative.  Musculoskeletal:  Positive for joint pain.  Skin: Negative.   Neurological: Negative.   Endo/Heme/Allergies: Negative.   Psychiatric/Behavioral: Negative.          Objective    BP (!) 142/73 (BP Location: Left Arm, Patient Position: Sitting, Cuff Size: Large)   Pulse 82   Ht 6' (1.829 m)   Wt 268 lb (121.6 kg)   SpO2 95%   BMI 36.35 kg/m   Physical Exam Constitutional:      General: He is not in acute distress.    Appearance: Normal appearance. He is obese.  HENT:     Head: Normocephalic and atraumatic.     Right Ear: External ear normal.     Left Ear: External ear normal.     Nose: Nose normal.     Mouth/Throat:     Mouth: Mucous membranes are moist.     Pharynx: Oropharynx is clear.  Eyes:     Extraocular Movements: Extraocular movements intact.     Conjunctiva/sclera: Conjunctivae normal.     Pupils: Pupils are equal, round, and reactive to light.  Cardiovascular:     Rate and Rhythm: Rhythm irregular.     Pulses:          Dorsalis pedis pulses are 1+ on the right side and 1+ on the left side.       Posterior tibial pulses are 1+ on the right side and 1+ on the left side.  Pulmonary:     Effort: Pulmonary effort is normal.     Breath sounds: Normal breath sounds.  Musculoskeletal:        General: Normal range of motion.     Cervical back: Normal range of motion and neck supple.     Right  lower leg: 2+ Pitting Edema present.     Left lower leg: 2+ Pitting Edema present.  Neurological:     General: No focal deficit present.     Mental Status: He is alert and oriented to person, place, and time.  Psychiatric:        Mood and Affect: Mood normal.        Behavior: Behavior normal.        Thought Content: Thought content normal.        Judgment: Judgment normal.         Assessment & Plan:   Problem List Items Addressed This Visit       Cardiovascular and Mediastinum   Chronic atrial fibrillation (HCC) - Primary   Acute on chronic diastolic CHF (congestive heart failure) (HCC)   Relevant Orders   Basic metabolic panel   Other Visit Diagnoses     Bilateral leg and foot pain       Relevant Medications   traMADol (ULTRAM) 50 MG tablet      1. Chronic atrial fibrillation (HCC) Continue current regimen, patient has appointment to establish care at [facility] on Jul 11 2022.  Strongly encouraged follow-up with new primary care provider, continue follow-up with cardiology.  2. Acute on chronic diastolic CHF (congestive heart failure) (HCC) Continue current regimen. Encourage patient to wear compression stockings as previously recommended by cardiology, keep feet elevated when able.   - Basic metabolic panel  3. Bilateral leg and foot pain Check of [place] controlled substance registry appropriate.  Trial tramadol.  Red flags given for prompt reevaluation - traMADol (ULTRAM) 50 MG tablet; Take 1 tablet (50 mg total) by mouth every 8 (eight) hours as needed for up to  5 days.  Dispense: 15 tablet; Refill: 0   I have reviewed the patient's medical history (PMH, PSH, Social History, Family History, Medications, and allergies) , and have been updated if relevant. I spent 30 minutes reviewing chart and  face to face time with patient.    Return in about 2 days (around 07/11/2022) for With Gwinda Passe, NP.   Kasandra Knudsen Mayers, PA-C

## 2022-07-09 NOTE — Telephone Encounter (Signed)
  Chief Complaint: Ankle, feet swelling Symptoms: Edema both feet and ankles, painful. Goes down when elevated Frequency: Weeks, worsening Pertinent Negatives: Patient denies  Disposition: [] ED /[] Urgent Care (no appt availability in office) / [] Appointment(In office/virtual)/ []  [place] [facility]/ [] Home Care/ [] Refused Recommended Disposition /[x] [place] [facility]/ []  Follow-up with PCP Additional Notes: No PCP, advised [facility], states will follow disposition. Care advise provided, pt verbalizes understanding.  Reason for Disposition  SEVERE swelling (e.g., can't move swollen ankle at all)    Feet and ankles  Answer Assessment - Initial Assessment Questions 1. LOCATION: "Which ankle is swollen?" "Where is the swelling?"     Both 2. ONSET: "When did the swelling start?"     Seen in ED 3. SWELLING: "How bad is the swelling?" Or, "How large is it?" (e.g., mild, moderate, severe; size of localized swelling)    - NONE: No joint swelling.   - LOCALIZED: Localized; small area of puffy or swollen skin (e.g., insect bite, skin irritation).   - MILD: Joint looks or feels mildly swollen or puffy.   - MODERATE: Swollen; interferes with normal activities (e.g., work or school); decreased range of movement; may be limping.   - SEVERE: Very swollen; can't move swollen joint at all; limping a lot or unable to walk.     severe 4. PAIN: "Is there any pain?" If Yes, ask: "How bad is it?" (Scale 1-10; or mild, moderate, severe)   - NONE (0): no pain.   - MILD (1-3): doesn't interfere with normal activities.    - MODERATE (4-7): interferes with normal activities (e.g., work or school) or awakens from sleep, limping.    - SEVERE (8-10): excruciating pain, unable to do any normal activities, unable to walk.      Moderate 5. CAUSE: "What do you think caused the ankle swelling?"     Unsure 6. OTHER SYMPTOMS: "Do you have any other symptoms?" (e.g., fever, chest pain, difficulty breathing,  calf pain)     No  Protocols used: Ankle Swelling-A-AH

## 2022-07-10 ENCOUNTER — Other Ambulatory Visit: Payer: Self-pay | Admitting: Physician Assistant

## 2022-07-10 ENCOUNTER — Telehealth (INDEPENDENT_AMBULATORY_CARE_PROVIDER_SITE_OTHER): Payer: Self-pay | Admitting: Primary Care

## 2022-07-10 LAB — BASIC METABOLIC PANEL
BUN/Creatinine Ratio: 18 (ref 10–24)
BUN: 24 mg/dL (ref 8–27)
CO2: 24 mmol/L (ref 20–29)
Calcium: 9.3 mg/dL (ref 8.6–10.2)
Chloride: 100 mmol/L (ref 96–106)
Creatinine, Ser: 1.34 mg/dL — ABNORMAL HIGH (ref 0.76–1.27)
Glucose: 89 mg/dL (ref 70–99)
Potassium: 3.7 mmol/L (ref 3.5–5.2)
Sodium: 140 mmol/L (ref 134–144)
eGFR: 60 mL/min/{1.73_m2} (ref >59)

## 2022-07-10 NOTE — Telephone Encounter (Signed)
Called pt and left a VM

## 2022-07-11 ENCOUNTER — Inpatient Hospital Stay (INDEPENDENT_AMBULATORY_CARE_PROVIDER_SITE_OTHER): Payer: No Typology Code available for payment source | Admitting: Primary Care

## 2022-07-22 ENCOUNTER — Ambulatory Visit (INDEPENDENT_AMBULATORY_CARE_PROVIDER_SITE_OTHER): Payer: Self-pay | Admitting: *Deleted

## 2022-07-22 ENCOUNTER — Ambulatory Visit: Payer: BLUE CROSS/BLUE SHIELD | Admitting: Physician Assistant

## 2022-07-22 ENCOUNTER — Encounter: Payer: Self-pay | Admitting: Physician Assistant

## 2022-07-22 VITALS — BP 126/69 | HR 57 | Wt 246.0 lb

## 2022-07-22 DIAGNOSIS — I482 Chronic atrial fibrillation, unspecified: Secondary | ICD-10-CM

## 2022-07-22 DIAGNOSIS — I5033 Acute on chronic diastolic (congestive) heart failure: Secondary | ICD-10-CM

## 2022-07-22 DIAGNOSIS — R601 Generalized edema: Secondary | ICD-10-CM | POA: Diagnosis not present

## 2022-07-22 MED ORDER — SPIRONOLACTONE 25 MG PO TABS
12.5000 mg | ORAL_TABLET | Freq: Every day | ORAL | 0 refills | Status: DC
Start: 2022-07-22 — End: 2023-07-15

## 2022-07-22 NOTE — Telephone Encounter (Signed)
  Chief Complaint: swelling Symptoms: Leg swelling knee down, moderate. Painful. Seen at [facility] 07/09/22, states tramadol not effective. Had appt 07/11/22 cancelled. Pt states he was told he did ot have to come in since seen at [facility]. Frequency: 2-4 weeks Pertinent Negatives: Patient denies redness, warmth Disposition: [] ED /[] Urgent Care (no appt availability in office) / [] Appointment(In office/virtual)/ []  [place] [facility]/ [] Home Care/ [] Refused Recommended Disposition /[x] [place] [facility]/ []  Follow-up with PCP Additional Notes: [facility]. States will follow disposition. Reason for Disposition  [1] MODERATE leg swelling (e.g., swelling extends up to knees) AND [2] new-onset or worsening  Answer Assessment - Initial Assessment Questions 1. ONSET: "When did the swelling start?" (e.g., minutes, hours, days)     2-4 weeks ago 2. LOCATION: "What part of the leg is swollen?"  "Are both legs swollen or just one leg?"     Knee down 3. SEVERITY: "How bad is the swelling?" (e.g., localized; mild, moderate, severe)   - Localized: Small area of swelling localized to one leg.   - MILD pedal edema: Swelling limited to foot and ankle, pitting edema < 1/4 inch (6 mm) deep, rest and elevation eliminate most or all swelling.   - MODERATE edema: Swelling of lower leg to knee, pitting edema > 1/4 inch (6 mm) deep, rest and elevation only partially reduce swelling.   - SEVERE edema: Swelling extends above knee, facial or hand swelling present.      Moderate  4. REDNESS: "Does the swelling look red or infected?"     no 5. PAIN: "Is the swelling painful to touch?" If Yes, ask: "How painful is it?"   (Scale 1-10; mild, moderate or severe)     yes 6. FEVER: "Do you have a fever?" If Yes, ask: "What is it, how was it measured, and when did it start?"      no 7. CAUSE: "What do you think is causing the leg swelling?"     Unsure 8. MEDICAL HISTORY: "Do you have a history of blood  clots (e.g., DVT), cancer, heart failure, kidney disease, or liver failure?"      9. RECURRENT SYMPTOM: "Have you had leg swelling before?" If Yes, ask: "When was the last time?" "What happened that time?"      10. OTHER SYMPTOMS: "Do you have any other symptoms?" (e.g., chest pain, difficulty breathing)       No  Protocols used: Leg Swelling and Edema-A-AH

## 2022-07-22 NOTE — Progress Notes (Signed)
Established Patient Office Visit  Subjective   Patient ID: James [name], male    DOB: Sep 12, 1960  Age: 62 y.o. MRN: [id number]  Chief Complaint  Patient presents with   Foot Swelling    Both feet   Foot Pain    Both feet.     States today that he continues to have bilateral lower edema.  States that he has been very compliant to low-sodium, compression stockings, keeping feet elevated when able and taking all medications as directed.  States that he did try tramadol without relief of pain.  States that is very painful to walk.  States that cardiology did stop his amlodipine, states that he however has not noticed any difference.  States that he has not taken the amlodipine in 3 to 4 days.     Past Medical History:  Diagnosis Date   A-fib (HCC)    Hypercholesteremia    Hypertension    Social History   Socioeconomic History   Marital status: Legally Separated    Spouse name: Not on file   Number of children: 0   Years of education: Not on file   Highest education level: Not on file  Occupational History   Not on file  Tobacco Use   Smoking status: Never    Passive exposure: Never   Smokeless tobacco: Never  Substance and Sexual Activity   Alcohol use: Not Currently    Alcohol/week: 16.0 standard drinks of alcohol    Types: 16 Shots of liquor per week   Drug use: Yes    Types: Cocaine   Sexual activity: Yes  Other Topics Concern   Not on file  Social History Narrative   Not on file   Social Determinants of Health   Financial Resource Strain: Not on file  Food Insecurity: No Food Insecurity (06/21/2022)   Hunger Vital Sign    Worried About Running Out of Food in the Last Year: Never true    Ran Out of Food in the Last Year: Never true  Transportation Needs: No Transportation Needs (06/21/2022)   PRAPARE - Administrator, Civil Service (Medical): No    Lack of Transportation (Non-Medical): No  Physical Activity: Not on file  Stress:  Not on file  Social Connections: Not on file  Intimate Partner Violence: Not At Risk (06/21/2022)   Humiliation, Afraid, Rape, and Kick questionnaire    Fear of Current or Ex-Partner: No    Emotionally Abused: No    Physically Abused: No    Sexually Abused: No   Family History  Problem Relation Age of Onset   Diabetes Mother    CAD Brother    No Known Allergies  Review of Systems  Constitutional: Negative.   HENT: Negative.    Eyes: Negative.   Respiratory:  Negative for shortness of breath.   Cardiovascular:  Positive for leg swelling. Negative for chest pain.  Gastrointestinal: Negative.   Genitourinary: Negative.   Musculoskeletal: Negative.   Skin: Negative.   Neurological: Negative.   Endo/Heme/Allergies: Negative.   Psychiatric/Behavioral: Negative.        Objective:     BP 126/69 (BP Location: Left Arm, Patient Position: Sitting, Cuff Size: Large)   Pulse (!) 57   Wt 246 lb (111.6 kg)   SpO2 98%   BMI 33.36 kg/m  BP Readings from Last 3 Encounters:  07/22/22 126/69  07/09/22 (!) 142/73  06/30/22 (!) 172/96   Wt Readings from Last 3 Encounters:  07/22/22  246 lb (111.6 kg)  07/09/22 268 lb (121.6 kg)  06/30/22 281 lb (127.5 kg)      Physical Exam Vitals and nursing note reviewed.  Constitutional:      Appearance: Normal appearance.  HENT:     Head: Normocephalic and atraumatic.     Right Ear: External ear normal.     Left Ear: External ear normal.     Nose: Nose normal.     Mouth/Throat:     Mouth: Mucous membranes are moist.     Pharynx: Oropharynx is clear.  Eyes:     Extraocular Movements: Extraocular movements intact.     Conjunctiva/sclera: Conjunctivae normal.     Pupils: Pupils are equal, round, and reactive to light.  Cardiovascular:     Rate and Rhythm: Rhythm irregular.     Pulses:          Dorsalis pedis pulses are 1+ on the right side and 1+ on the left side.       Posterior tibial pulses are 1+ on the right side and 1+ on the  left side.  Pulmonary:     Effort: Pulmonary effort is normal.     Breath sounds: Normal breath sounds.  Musculoskeletal:     Cervical back: Normal range of motion and neck supple.     Right lower leg: 2+ Edema present.     Left lower leg: 2+ Edema present.  Skin:    General: Skin is warm and dry.  Neurological:     General: No focal deficit present.     Mental Status: He is oriented to person, place, and time.  Psychiatric:        Mood and Affect: Mood normal.        Behavior: Behavior normal.        Thought Content: Thought content normal.        Judgment: Judgment normal.        Assessment & Plan:   Problem List Items Addressed This Visit       Cardiovascular and Mediastinum   Chronic atrial fibrillation (HCC)   Relevant Medications   spironolactone (ALDACTONE) 25 MG tablet   Acute on chronic diastolic CHF (congestive heart failure) (HCC)   Relevant Medications   spironolactone (ALDACTONE) 25 MG tablet   Other Visit Diagnoses     Generalized edema    -  Primary   Relevant Medications   spironolactone (ALDACTONE) 25 MG tablet     1. Generalized edema Unfortunately patient was unable to see Gwinda Passe to establish care on May 17 due to provider canceling appointment.  Patient given appointment to establish care with Dr. Alvis Lemmings on July 16.  22# weight loss in past two weeks  Patient called cardiology while in clinic and has appointment to see them tomorrow morning at 1030.  Trial spironolactone.  Patient strongly encouraged to continue supportive care, red flags given for prompt reevaluation. - spironolactone (ALDACTONE) 25 MG tablet; Take 0.5 tablets (12.5 mg total) by mouth daily.  Dispense: 15 tablet; Refill: 0  2. Acute on chronic diastolic CHF (congestive heart failure) (HCC)  - spironolactone (ALDACTONE) 25 MG tablet; Take 0.5 tablets (12.5 mg total) by mouth daily.  Dispense: 15 tablet; Refill: 0  3. Chronic atrial fibrillation (HCC)    I have  reviewed the patient's medical history (PMH, PSH, Social History, Family History, Medications, and allergies) , and have been updated if relevant. I spent 30 minutes reviewing chart and  face to face time with  patient.     Return if symptoms worsen or fail to improve.    Kasandra Knudsen Mayers, PA-C

## 2022-07-22 NOTE — Patient Instructions (Signed)
You are going to add on spironolactone 12.5 mg to your daily regimen.  Continue working on keeping your feet elevated when able, wearing the compression stockings, avoiding sodium.  Make sure that you follow-up with cardiology as soon as you are able.  Please feel free to return to the mobile unit anytime you need. (Or at least in 2 weeks)   Roney Jaffe, PA-C Physician Assistant [facility] [email or web address]   Peripheral Edema  Peripheral edema is swelling that is caused by a buildup of fluid. Peripheral edema most often affects the lower legs, ankles, and feet. It can also develop in the arms, hands, and face. The area of the body that has peripheral edema will look swollen. It may also feel heavy or warm. Your clothes may start to feel tight. Pressing on the area may make a temporary dent in your skin (pitting edema). You may not be able to move your swollen arm or leg as much as usual. There are many causes of peripheral edema. It can happen because of a complication of other conditions such as heart failure, kidney disease, or a problem with your circulation. It also can be a side effect of certain medicines or happen because of an infection. It often happens to women during pregnancy. Sometimes, the cause is not known. Follow these instructions at home: Managing pain, stiffness, and swelling  Raise (elevate) your legs while you are sitting or lying down. Move around often to prevent stiffness and to reduce swelling. Do not sit or stand for long periods of time. Do not wear tight clothing. Do not wear garters on your upper legs. Exercise your legs to get your circulation going. This helps to move the fluid back into your blood vessels, and it may help the swelling go down. Wear compression stockings as told by your health care provider. These stockings help to prevent blood clots and reduce swelling in your legs. It is important  that these are the correct size. These stockings should be prescribed by your doctor to prevent possible injuries. If elastic bandages or wraps are recommended, use them as told by your health care provider. Medicines Take over-the-counter and prescription medicines only as told by your health care provider. Your health care provider may prescribe medicine to help your body get rid of excess water (diuretic). Take this medicine if you are told to take it. General instructions Eat a low-salt (low-sodium) diet as told by your health care provider. Sometimes, eating less salt may reduce swelling. Pay attention to any changes in your symptoms. Moisturize your skin daily to help prevent skin from cracking and draining. Keep all follow-up visits. This is important. Contact a health care provider if: You have a fever. You have swelling in only one leg. You have increased swelling, redness, or pain in one or both of your legs. You have drainage or sores at the area where you have edema. Get help right away if: You have edema that starts suddenly or is getting worse, especially if you are pregnant or have a medical condition. You develop shortness of breath, especially when you are lying down. You have pain in your chest or abdomen. You feel weak. You feel like you will faint. These symptoms may be an emergency. Get help right away. Call 911. Do not wait to see if the symptoms will go away. Do not drive yourself to the hospital. Summary Peripheral edema is swelling that is caused by a buildup of fluid. Peripheral edema  most often affects the lower legs, ankles, and feet. Move around often to prevent stiffness and to reduce swelling. Do not sit or stand for long periods of time. Pay attention to any changes in your symptoms. Contact a health care provider if you have edema that starts suddenly or is getting worse, especially if you are pregnant or have a medical condition. Get help right away if  you develop shortness of breath, especially when lying down. This information is not intended to replace advice given to you by your health care provider. Make sure you discuss any questions you have with your health care provider. Document Revised: 10/15/2020 Document Reviewed: 10/15/2020 Elsevier Patient Education  2024 [employer].

## 2022-07-22 NOTE — Telephone Encounter (Signed)
Will forward to provider  

## 2022-07-23 ENCOUNTER — Ambulatory Visit: Payer: BLUE CROSS/BLUE SHIELD | Admitting: Internal Medicine

## 2022-07-28 ENCOUNTER — Ambulatory Visit: Payer: BLUE CROSS/BLUE SHIELD | Admitting: Internal Medicine

## 2022-08-01 ENCOUNTER — Other Ambulatory Visit: Payer: Self-pay

## 2022-08-01 MED ORDER — ISOSORB DINITRATE-HYDRALAZINE 20-37.5 MG PO TABS: 2.0000 | ORAL_TABLET | Freq: Three times a day (TID) | ORAL | 3 refills | Status: DC

## 2022-08-01 MED ORDER — EMPAGLIFLOZIN 10 MG PO TABS: 10.0000 mg | ORAL_TABLET | Freq: Every day | ORAL | 3 refills | Status: DC

## 2022-08-01 MED ORDER — FUROSEMIDE 80 MG PO TABS: 80.0000 mg | ORAL_TABLET | Freq: Two times a day (BID) | ORAL | 3 refills | Status: DC

## 2022-08-01 MED ORDER — APIXABAN 5 MG PO TABS: 5.0000 mg | ORAL_TABLET | Freq: Two times a day (BID) | ORAL | 3 refills | Status: DC

## 2022-08-06 ENCOUNTER — Ambulatory Visit: Payer: BLUE CROSS/BLUE SHIELD | Attending: Family Medicine

## 2022-08-06 ENCOUNTER — Ambulatory Visit: Payer: BLUE CROSS/BLUE SHIELD | Admitting: Physician Assistant

## 2022-08-06 ENCOUNTER — Encounter: Payer: Self-pay | Admitting: Physician Assistant

## 2022-08-06 ENCOUNTER — Telehealth: Payer: Self-pay

## 2022-08-06 VITALS — BP 120/73 | HR 77 | Ht 71.0 in | Wt 265.0 lb

## 2022-08-06 DIAGNOSIS — R601 Generalized edema: Secondary | ICD-10-CM | POA: Diagnosis not present

## 2022-08-06 DIAGNOSIS — Z8739 Personal history of other diseases of the musculoskeletal system and connective tissue: Secondary | ICD-10-CM | POA: Insufficient documentation

## 2022-08-06 DIAGNOSIS — I10 Essential (primary) hypertension: Secondary | ICD-10-CM

## 2022-08-06 DIAGNOSIS — I5033 Acute on chronic diastolic (congestive) heart failure: Secondary | ICD-10-CM | POA: Diagnosis not present

## 2022-08-06 DIAGNOSIS — I482 Chronic atrial fibrillation, unspecified: Secondary | ICD-10-CM | POA: Diagnosis not present

## 2022-08-06 NOTE — Telephone Encounter (Signed)
Patient needs a letter stating he cannot work due to being on crutches from all the swelling from being in the hospital. So he can get help with his rent from [employer]

## 2022-08-06 NOTE — Progress Notes (Signed)
Established Patient Office Visit  Subjective   Patient ID: James [name], male    DOB: [birth year]  Age: 62 y.o. MRN: [id number]  Chief Complaint  Patient presents with   Arthritis    Gout   Depression    Positive PHQ-9- thoughts of harming self or better of dead, score 3, extremely difficult     States that he has been unable to follow-up with cardiology due to issues with his insurance and cardiology being out of network.  States that he was given an appointment to be seen at [facility] cardiology but his appointment is not until July 26.  States that his swelling has improved greatly, states that he believes he was taking the Lasix incorrectly and is now taking it twice a day.  Does state that he thinks he is experiencing gout, states that he continues to have pain in his right great toe, his left ankle and his left knee.  States that he did purchase an over-the-counter pain cream for some relief of the pain in his left knee.  Continues not to have a scale at home.  States that he does want to get back to work as soon as he is able, has fears of losing his home.    When asked about his scores on PHQ-9, states that he states that was a mistake, that he has no thoughts that he would be better off dead adamantly denies any thoughts of hurting himself.    Past Medical History:  Diagnosis Date   A-fib (HCC)    Hypercholesteremia    Hypertension    Social History   Socioeconomic History   Marital status: Legally Separated    Spouse name: Not on file   Number of children: 0   Years of education: Not on file   Highest education level: Not on file  Occupational History   Not on file  Tobacco Use   Smoking status: Never    Passive exposure: Never   Smokeless tobacco: Never  Substance and Sexual Activity   Alcohol use: Not Currently    Alcohol/week: 16.0 standard drinks of alcohol    Types: 16 Shots of liquor per week   Drug use: Yes    Types: Cocaine   Sexual  activity: Yes  Other Topics Concern   Not on file  Social History Narrative   Not on file   Social Determinants of Health   Financial Resource Strain: Not on file  Food Insecurity: No Food Insecurity (06/21/2022)   Hunger Vital Sign    Worried About Running Out of Food in the Last Year: Never true    Ran Out of Food in the Last Year: Never true  Transportation Needs: No Transportation Needs (06/21/2022)   PRAPARE - Administrator, Civil Service (Medical): No    Lack of Transportation (Non-Medical): No  Physical Activity: Not on file  Stress: Not on file  Social Connections: Not on file  Intimate Partner Violence: Not At Risk (06/21/2022)   Humiliation, Afraid, Rape, and Kick questionnaire    Fear of Current or Ex-Partner: No    Emotionally Abused: No    Physically Abused: No    Sexually Abused: No   Family History  Problem Relation Age of Onset   Diabetes Mother    CAD Brother    No Known Allergies  Review of Systems  Constitutional: Negative.   HENT: Negative.    Respiratory:  Negative for shortness of breath.  Cardiovascular:  Negative for chest pain.  Gastrointestinal: Negative.   Genitourinary: Negative.   Musculoskeletal:  Positive for joint pain.  Skin: Negative.   Neurological: Negative.   Endo/Heme/Allergies: Negative.   Psychiatric/Behavioral:  Negative for depression. The patient is not nervous/anxious and does not have insomnia.       Objective:     BP 120/73 (BP Location: Left Arm, Patient Position: Sitting, Cuff Size: Large)   Pulse 77   Ht 5\' 11"  (1.803 m)   Wt 265 lb (120.2 kg)   SpO2 97%   BMI 36.96 kg/m  BP Readings from Last 3 Encounters:  08/06/22 120/73  07/22/22 126/69  07/09/22 (!) 142/73   Wt Readings from Last 3 Encounters:  08/06/22 265 lb (120.2 kg)  07/22/22 246 lb (111.6 kg)  07/09/22 268 lb (121.6 kg)      Physical Exam Vitals and nursing note reviewed.  Constitutional:      Appearance: Normal appearance.   HENT:     Head: Normocephalic and atraumatic.     Right Ear: External ear normal.     Left Ear: External ear normal.     Nose: Nose normal.     Mouth/Throat:     Mouth: Mucous membranes are moist.     Pharynx: Oropharynx is clear.  Eyes:     Extraocular Movements: Extraocular movements intact.     Conjunctiva/sclera: Conjunctivae normal.     Pupils: Pupils are equal, round, and reactive to light.  Cardiovascular:     Rate and Rhythm: Rhythm irregular.     Pulses:          Dorsalis pedis pulses are 1+ on the right side and 1+ on the left side.       Posterior tibial pulses are 1+ on the right side and 1+ on the left side.     Heart sounds: Normal heart sounds.  Pulmonary:     Effort: Pulmonary effort is normal.     Breath sounds: Normal breath sounds.  Musculoskeletal:        General: Normal range of motion.     Cervical back: Normal range of motion and neck supple.     Right lower leg: Pitting Edema present.     Left lower leg: Pitting Edema present.  Skin:    General: Skin is warm and dry.  Neurological:     General: No focal deficit present.     Mental Status: He is alert and oriented to person, place, and time.  Psychiatric:        Mood and Affect: Mood normal.        Behavior: Behavior normal.        Thought Content: Thought content normal.        Judgment: Judgment normal.        Assessment & Plan:   Problem List Items Addressed This Visit       Cardiovascular and Mediastinum   Essential hypertension   Chronic atrial fibrillation (HCC)   Acute on chronic diastolic CHF (congestive heart failure) (HCC)   Relevant Orders   Brain natriuretic peptide     Other   Generalized edema - Primary   Relevant Orders   Basic metabolic panel   History of gout   Relevant Orders   Uric Acid  1. Generalized edema [facility] to have labs completed today at 130.  Patient to return to mobile unit at that time and bring  all medications to  confirm he is taking correct medications at correct times.  Patient did return to the mobile unit along with his medications.  Patient was unfortunately taking his Lasix incorrectly.  He was taking 240 mg twice daily.  Resume amlodipine 2 days ago on his own and he had only taken the spironolactone twice.  Went through all medications with patient, wrote down exact instructions on how and when to take.  Patient understands and agrees.  Patient encouraged to continue with lifestyle modifications  Patient will maintain follow-up with mobile unit until he is able to establish with cardiology and Dr. Alvis Lemmings. - Basic metabolic panel; Future  2. Acute on chronic diastolic CHF (congestive heart failure) (HCC) Continue follow-up with cardiology as scheduled.  Red flags given for prompt reevaluation - Brain natriuretic peptide; Future  3. Chronic atrial fibrillation (HCC) Continue current regimen, continue follow-up with cardiology, red flags given for prompt reevaluation  4. Essential hypertension   5. History of gout  - Uric Acid; Future   I have reviewed the patient's medical history (PMH, PSH, Social History, Family History, Medications, and allergies) , and have been updated if relevant. I spent 30 minutes reviewing chart and  face to face time with patient.    Return in about 2 weeks (around 08/20/2022) for with [facility].    Kasandra Knudsen Mayers, PA-C

## 2022-08-06 NOTE — Patient Instructions (Signed)
We will call you with the lab results as soon as they are available.  Please return to the mobile unit this afternoon with her medications for review.  Roney Jaffe, PA-C Physician Assistant [facility] [email or web address]

## 2022-08-07 ENCOUNTER — Telehealth: Payer: Self-pay

## 2022-08-07 ENCOUNTER — Ambulatory Visit: Payer: Self-pay

## 2022-08-07 LAB — BRAIN NATRIURETIC PEPTIDE: BNP: 53.5 pg/mL (ref 0.0–100.0)

## 2022-08-07 LAB — BASIC METABOLIC PANEL
BUN/Creatinine Ratio: 25 — ABNORMAL HIGH (ref 10–24)
BUN: 40 mg/dL — ABNORMAL HIGH (ref 8–27)
CO2: 27 mmol/L (ref 20–29)
Calcium: 9.3 mg/dL (ref 8.6–10.2)
Chloride: 96 mmol/L (ref 96–106)
Creatinine, Ser: 1.58 mg/dL — ABNORMAL HIGH (ref 0.76–1.27)
Glucose: 108 mg/dL — ABNORMAL HIGH (ref 70–99)
Potassium: 3.6 mmol/L (ref 3.5–5.2)
Sodium: 138 mmol/L (ref 134–144)
eGFR: 49 mL/min/{1.73_m2} — ABNORMAL LOW (ref >59)

## 2022-08-07 LAB — URIC ACID: Uric Acid: 8.5 mg/dL — ABNORMAL HIGH (ref 3.8–8.4)

## 2022-08-07 MED ORDER — ALLOPURINOL 100 MG PO TABS
100.0000 mg | ORAL_TABLET | Freq: Every day | ORAL | 0 refills | Status: AC
Start: 2022-08-07 — End: ?

## 2022-08-07 MED ORDER — COLCHICINE 0.6 MG PO TABS
0.6000 mg | ORAL_TABLET | Freq: Every day | ORAL | 0 refills | Status: DC
Start: 2022-08-07 — End: 2023-07-15

## 2022-08-07 NOTE — Telephone Encounter (Signed)
Patient returned call to [facility], results of increased uric acid discussed, Patient is aware two medications have been sent to his pharmacy for treatment of gout. He will start medications and revisit unit for follow up in 2 weeks.   Patient is aware his kidney function has decreased, likely due to not taking lasix medication as prescribed. Medication dosage discussed, and all questions answered. Patient will be notified to scheduling patient for follow up the week of the 24th.

## 2022-08-07 NOTE — Addendum Note (Signed)
Addended by: Roney Jaffe on: 08/07/2022 09:37 AM   Modules accepted: Orders

## 2022-08-07 NOTE — Telephone Encounter (Signed)
  Chief Complaint: Lab results Symptoms: Pain in foot and ankle Frequency:  Pertinent Negatives: Patient denies  Disposition: [] ED /[] Urgent Care (no appt availability in office) / [] Appointment(In office/virtual)/ []  [facility]/ [] Home Care/ [] Refused Recommended Disposition /[] [place] [facility]/ [x]  Follow-up with PCP Additional Notes: Pt called for lab results. Pt states we have not called him and he has had the phone in his hand all day. Pt does not answer "spam" calls.  Shared provider's note. Pt will p/u medication and start today. Pt will adjust lasix dose as provider indicated yesterday.    Cassell Clement, [facility] 08/07/2022 12:26 PM EDT Back to Top    An unsuccessful attempt to contact patient to discuss recent lab results. Unable to lvm.   Kasandra Knudsen Mayers, PA-C 08/07/2022  9:37 AM EDT     Please call patient and let him know that his uric acid is slightly elevated.  He is going to start taking allopurinol on a daily basis and will do a short course of colchicine. Prescriptions sent to his pharmacy   His kidney function has decreased slightly, this is more than likely due to him taking more Lasix than prescribed.  This should improve with his proper dosing of Lasix as directed during the office visit.  His heart failure markers are still pending.  It is important for him to follow up with the [facility] the week of June 24th     Reason for Disposition  Health Information question, no triage required and triager able to answer question  Answer Assessment - Initial Assessment Questions 1. REASON FOR CALL or QUESTION: "What is your reason for calling today?" or "How can I best help you?" or "What question do you have that I can help answer?"     Lab results from yesterday  Protocols used: Information Only Call - No Triage-A-AH

## 2022-08-14 NOTE — Telephone Encounter (Signed)
Defer to PCP.  Thanks MJP  

## 2022-08-19 ENCOUNTER — Encounter (HOSPITAL_COMMUNITY): Payer: Self-pay | Admitting: Cardiology

## 2022-09-09 ENCOUNTER — Ambulatory Visit: Payer: BLUE CROSS/BLUE SHIELD | Admitting: Family Medicine

## 2023-04-16 ENCOUNTER — Encounter (HOSPITAL_COMMUNITY): Payer: Self-pay

## 2023-04-16 ENCOUNTER — Emergency Department (HOSPITAL_COMMUNITY): Payer: Medicaid Other

## 2023-04-16 ENCOUNTER — Emergency Department (HOSPITAL_COMMUNITY)
Admission: EM | Admit: 2023-04-16 | Discharge: 2023-04-16 | Disposition: A | Discharge status: Home / Self Care | Payer: Medicaid Other | Attending: Emergency Medicine | Admitting: Emergency Medicine

## 2023-04-16 ENCOUNTER — Other Ambulatory Visit: Payer: Self-pay

## 2023-04-16 DIAGNOSIS — Z79899 Other long term (current) drug therapy: Secondary | ICD-10-CM | POA: Diagnosis not present

## 2023-04-16 DIAGNOSIS — R109 Unspecified abdominal pain: Secondary | ICD-10-CM | POA: Diagnosis not present

## 2023-04-16 DIAGNOSIS — I10 Essential (primary) hypertension: Secondary | ICD-10-CM | POA: Insufficient documentation

## 2023-04-16 DIAGNOSIS — Z7901 Long term (current) use of anticoagulants: Secondary | ICD-10-CM | POA: Insufficient documentation

## 2023-04-16 DIAGNOSIS — M545 Low back pain, unspecified: Secondary | ICD-10-CM | POA: Diagnosis present

## 2023-04-16 LAB — URINALYSIS, ROUTINE W REFLEX MICROSCOPIC
Bacteria, UA: NONE SEEN
Bilirubin Urine: NEGATIVE
Glucose, UA: >=500 mg/dL — AB
Ketones, ur: NEGATIVE mg/dL
Leukocytes,Ua: NEGATIVE
Nitrite: NEGATIVE
Protein, ur: 30 mg/dL — AB
Specific Gravity, Urine: 1.011 (ref 1.005–1.030)
pH: 6 (ref 5.0–8.0)

## 2023-04-16 LAB — CBC WITH DIFFERENTIAL/PLATELET
Abs Immature Granulocytes: 0.02 10*3/uL (ref 0.00–0.07)
Basophils Absolute: 0 10*3/uL (ref 0.0–0.1)
Basophils Relative: 0 %
Eosinophils Absolute: 0 10*3/uL (ref 0.0–0.5)
Eosinophils Relative: 0 %
HCT: 47.8 % (ref 39.0–52.0)
Hemoglobin: 15.4 g/dL (ref 13.0–17.0)
Immature Granulocytes: 0 %
Lymphocytes Relative: 16 %
Lymphs Abs: 1.4 10*3/uL (ref 0.7–4.0)
MCH: 27.4 pg (ref 26.0–34.0)
MCHC: 32.2 g/dL (ref 30.0–36.0)
MCV: 85.1 fL (ref 80.0–100.0)
Monocytes Absolute: 0.4 10*3/uL (ref 0.1–1.0)
Monocytes Relative: 5 %
Neutro Abs: 6.9 10*3/uL (ref 1.7–7.7)
Neutrophils Relative %: 79 %
Platelets: 180 10*3/uL (ref 150–400)
RBC: 5.62 MIL/uL (ref 4.22–5.81)
RDW: 16 % — ABNORMAL HIGH (ref 11.5–15.5)
WBC: 8.7 10*3/uL (ref 4.0–10.5)
nRBC: 0 % (ref 0.0–0.2)

## 2023-04-16 LAB — BASIC METABOLIC PANEL
Anion gap: 18 — ABNORMAL HIGH (ref 5–15)
BUN: 10 mg/dL (ref 8–23)
CO2: 22 mmol/L (ref 22–32)
Calcium: 9.9 mg/dL (ref 8.9–10.3)
Chloride: 97 mmol/L — ABNORMAL LOW (ref 98–111)
Creatinine, Ser: 1.21 mg/dL (ref 0.61–1.24)
GFR, Estimated: >60 mL/min (ref >60)
Glucose, Bld: 137 mg/dL — ABNORMAL HIGH (ref 70–99)
Potassium: 3.5 mmol/L (ref 3.5–5.1)
Sodium: 137 mmol/L (ref 135–145)

## 2023-04-16 MED ORDER — METHOCARBAMOL 500 MG PO TABS: 500.0000 mg | ORAL_TABLET | Freq: Two times a day (BID) | ORAL | 0 refills | Status: DC

## 2023-04-16 MED ORDER — METHYLPREDNISOLONE 4 MG PO TBPK: ORAL_TABLET | ORAL | 0 refills | Status: DC

## 2023-04-16 MED ORDER — HYDROCODONE-ACETAMINOPHEN 5-325 MG PO TABS
1.0000 | ORAL_TABLET | Freq: Once | ORAL | Status: AC
  Administered 2023-04-16: 1 via ORAL
  Filled 2023-04-16: qty 1

## 2023-04-16 MED ORDER — KETOROLAC TROMETHAMINE 15 MG/ML IJ SOLN
15.0000 mg | Freq: Once | INTRAMUSCULAR | Status: AC
  Administered 2023-04-16: 15 mg via INTRAMUSCULAR
  Filled 2023-04-16: qty 1

## 2023-04-16 MED ORDER — ONDANSETRON 4 MG PO TBDP
4.0000 mg | ORAL_TABLET | Freq: Once | ORAL | Status: AC
  Administered 2023-04-16: 4 mg via ORAL
  Filled 2023-04-16: qty 1

## 2023-04-16 MED ORDER — DICLOFENAC SODIUM 1 % EX GEL: 4.0000 g | Freq: Four times a day (QID) | CUTANEOUS | 0 refills | Status: DC

## 2023-04-16 NOTE — ED Provider Triage Note (Signed)
 Emergency Medicine Provider Triage Evaluation Note  James [name] , a 63 y.o. male  was evaluated in triage.  Pt complains of left flank pain that began suddenly today.  Patient has no chest pain or shortness of breath is unsure of dysuria or hematuria.  Patient is unsure of history of stones.  Patient does endorse nausea vomiting.  Patient was also noted to be hypertensive here and states he has missed a day or 2 of his blood pressure meds.  Patient is on Eliquis for his A-fib and states he has been compliant with this.  Review of Systems  Positive:  Negative:   Physical Exam  BP (!) 211/106   Pulse 64   Temp 97.7 F (36.5 C) (Oral)   Resp 18   SpO2 98%  Gen:   Awake, no distress   Resp:  Normal effort  MSK:   Moves extremities without difficulty  Other:  Mild left CVA tenderness  Medical Decision Making  Medically screening exam initiated at 6:54 PM.  Appropriate orders placed.  James [name] was informed that the remainder of the evaluation will be completed by another provider, this initial triage assessment does not replace that evaluation, and the importance of remaining in the ED until their evaluation is complete.  Workup initiated, suspect possible stone, patient was hypertensive at 211 systolic and so we will obtain labs to rule out endorgan damage.  Patient stable at this time.   James [name], [place] 04/16/23 [phone]

## 2023-04-16 NOTE — ED Triage Notes (Signed)
 Pt c/o L lower back pain that began last night, along with several episodes of emesis; denies fevers; denies urinary symptoms; denies known injury, denies hx of same; pt hypertensive in triage, states he takes his antihypertensives "sometimes"; last took yesterday  Verbal consent given for mse

## 2023-04-16 NOTE — ED Provider Notes (Signed)
 [name] EMERGENCY DEPARTMENT AT [facility] Provider Note   CSN: [id number] Arrival date & time: 04/16/23  1813     History  No chief complaint on file.   James [name] is a 63 y.o. male.  63 yo M with a chief complaint of right-sided low back pain.  Going on for a few days.  Seems to come and go but worse with certain movements twisting palpation.  Denies trauma.  Denies loss of bowel or bladder denies loss of perirectal sensation denies numbness or weakness to the leg.        Home Medications Prior to Admission medications   Medication Sig Start Date End Date Taking? Authorizing Provider  diclofenac Sodium (VOLTAREN) 1 % GEL Apply 4 g topically 4 (four) times daily. 04/16/23  Yes Melene Plan, DO  methocarbamol (ROBAXIN) 500 MG tablet Take 1 tablet (500 mg total) by mouth 2 (two) times daily. 04/16/23  Yes Melene Plan, DO  methylPREDNISolone (MEDROL DOSEPAK) 4 MG TBPK tablet Day 1: 8mg  before breakfast, 4 mg after lunch, 4 mg after supper, and 8 mg at bedtime Day 2: 4 mg before breakfast, 4 mg after lunch, 4 mg  after supper, and 8 mg  at bedtime Day 3:  4 mg  before breakfast, 4 mg  after lunch, 4 mg after supper, and 4 mg  at bedtime Day 4: 4 mg  before breakfast, 4 mg  after lunch, and 4 mg at bedtime Day 5: 4 mg  before breakfast and 4 mg at bedtime Day 6: 4 mg  before breakfast 04/16/23  Yes Melene Plan, DO  allopurinol (ZYLOPRIM) 100 MG tablet Take 1 tablet (100 mg total) by mouth daily. 08/07/22   Mayers, Cari S, PA-C  amLODipine (NORVASC) 10 MG tablet Take 1 tablet (10 mg total) by mouth daily. 06/30/22 09/28/22  Custovic, Rozell Searing, DO  apixaban (ELIQUIS) 5 MG TABS tablet Take 1 tablet (5 mg total) by mouth 2 (two) times daily. 08/01/22 08/31/22  Custovic, Rozell Searing, DO  colchicine 0.6 MG tablet Take 1 tablet (0.6 mg total) by mouth daily. 08/07/22   Mayers, Cari S, PA-C  empagliflozin (JARDIANCE) 10 MG TABS tablet Take 1 tablet (10 mg total) by mouth daily. 08/01/22    Custovic, Rozell Searing, DO  furosemide (LASIX) 80 MG tablet Take 1 tablet (80 mg total) by mouth 2 (two) times daily. 08/01/22 10/30/22  Custovic, Rozell Searing, DO  isosorbide-hydrALAZINE (BIDIL) 20-37.5 MG tablet Take 2 tablets by mouth 3 (three) times daily. 08/01/22   Custovic, Rozell Searing, DO  rosuvastatin (CRESTOR) 20 MG tablet Take 1 tablet (20 mg total) by mouth daily. 06/30/22 09/28/22  Custovic, Rozell Searing, DO  spironolactone (ALDACTONE) 25 MG tablet Take 0.5 tablets (12.5 mg total) by mouth daily. 07/22/22   Mayers, Cari S, PA-C      Allergies    Patient has no known allergies.    Review of Systems   Review of Systems  Physical Exam Updated Vital Signs BP (!) 175/79 (BP Location: Right Arm)   Pulse 65   Temp 97.7 F (36.5 C) (Oral)   Resp 18   SpO2 98%  Physical Exam Vitals and nursing note reviewed.  Constitutional:      Appearance: He is well-developed.  HENT:     Head: Normocephalic and atraumatic.  Eyes:     Pupils: Pupils are equal, round, and reactive to light.  Neck:     Vascular: No JVD.  Cardiovascular:     Rate and Rhythm: Normal rate  and regular rhythm.     Heart sounds: No murmur heard.    No friction rub. No gallop.  Pulmonary:     Effort: No respiratory distress.     Breath sounds: No wheezing.  Abdominal:     General: There is no distension.     Tenderness: There is no abdominal tenderness. There is no guarding or rebound.  Musculoskeletal:        General: Normal range of motion.     Cervical back: Normal range of motion and neck supple.     Comments: Pulse motor and sensation intact to the right lower extremity.  Reflexes are 2+ and equal.  No clonus.  Skin:    Coloration: Skin is not pale.     Findings: No rash.  Neurological:     Mental Status: He is alert and oriented to person, place, and time.  Psychiatric:        Behavior: Behavior normal.     ED Results / Procedures / Treatments   Labs (all labs ordered are listed, but only abnormal results are  displayed) Labs Reviewed  BASIC METABOLIC PANEL - Abnormal; Notable for the following components:      Result Value   Chloride 97 (*)    Glucose, Bld 137 (*)    Anion gap 18 (*)    All other components within normal limits  CBC WITH DIFFERENTIAL/PLATELET - Abnormal; Notable for the following components:   RDW 16.0 (*)    All other components within normal limits  URINALYSIS, ROUTINE W REFLEX MICROSCOPIC - Abnormal; Notable for the following components:   Glucose, UA >=500 (*)    Hgb urine dipstick SMALL (*)    Protein, ur 30 (*)    All other components within normal limits    EKG EKG Interpretation Date/Time:  Thursday April 16 2023 19:09:06 EST Ventricular Rate:  84 PR Interval:    QRS Duration:  100 QT Interval:  444 QTC Calculation: 524 R Axis:   24  Text Interpretation: Atrial fibrillation Minimal voltage criteria for LVH, may be normal variant ( Cornell product ) Prolonged QT Abnormal ECG qt manually calculated normal No significant change since last tracing Confirmed by Melene Plan [phone]) on 04/16/2023 8:11:32 PM  Radiology DG Chest Port 1 View Result Date: 04/16/2023 CLINICAL DATA:  Hypertension, weakness.  Emesis. EXAM: PORTABLE CHEST 1 VIEW COMPARISON:  06/20/2022. FINDINGS: The heart is enlarged and mediastinal contours are within normal limits. There is atherosclerotic calcification of the aorta. The pulmonary vasculature is distended. No consolidation, effusion, or pneumothorax. No acute osseous abnormality is seen. IMPRESSION: Cardiomegaly with distended pulmonary vasculature. Electronically Signed   By: Thornell Sartorius M.D.   On: 04/16/2023 20:30   CT Renal Stone Study Result Date: 04/16/2023 CLINICAL DATA:  Abdominal and flank pain EXAM: CT ABDOMEN AND PELVIS WITHOUT CONTRAST TECHNIQUE: Multidetector CT imaging of the abdomen and pelvis was performed following the standard protocol without IV contrast. RADIATION DOSE REDUCTION: This exam was performed according to  the departmental dose-optimization program which includes automated exposure control, adjustment of the mA and/or kV according to patient size and/or use of iterative reconstruction technique. COMPARISON:  None Available. FINDINGS: Lower chest: No acute pleural or parenchymal lung disease. The heart is enlarged without pericardial effusion. Hepatobiliary: Heterogeneous decreased liver attenuation consistent with hepatic steatosis. There is subtle nodularity of the liver capsule suspicious for cirrhosis. No evidence of cholelithiasis or cholecystitis. Pancreas: Unremarkable unenhanced appearance. Spleen: Unremarkable unenhanced appearance. Adrenals/Urinary Tract: No urinary tract  calculi or obstructive uropathy within either kidney. The adrenals and bladder are unremarkable. Stomach/Bowel: No bowel obstruction or ileus. Normal appendix right lower quadrant. Scattered colonic diverticulosis without evidence of acute diverticulitis. No bowel wall thickening or inflammatory change. Vascular/Lymphatic: Aortic atherosclerosis. No enlarged abdominal or pelvic lymph nodes. Reproductive: Prostate is unremarkable. Other: No free fluid or free intraperitoneal gas. No abdominal wall hernia. Musculoskeletal: No acute or destructive bony abnormalities. Reconstructed images demonstrate no additional findings. IMPRESSION: 1. No acute intra-abdominal or intrapelvic process. No urinary tract calculi or obstructive uropathy. 2. Hepatic steatosis, with subtle nodularity of the liver capsule suspicious for cirrhosis. 3. Cardiomegaly. 4.  Aortic Atherosclerosis (ICD10-I70.0). Electronically Signed   By: Sharlet Salina M.D.   On: 04/16/2023 19:38    Procedures Procedures    Medications Ordered in ED Medications  HYDROcodone-acetaminophen (NORCO/VICODIN) 5-325 MG per tablet 1 tablet (1 tablet Oral Given 04/16/23 2108)  ondansetron (ZOFRAN-ODT) disintegrating tablet 4 mg (4 mg Oral Given 04/16/23 2105)  ketorolac (TORADOL) 15 MG/ML  injection 15 mg (15 mg Intramuscular Given 04/16/23 2105)    ED Course/ Medical Decision Making/ A&P                                 Medical Decision Making Risk Prescription drug management.   63 yo M with a chief complaints of right-sided low back pain.  Sounds musculoskeletal by history and physical.  He was seen in the MSE process and labs are ordered as well as CT imaging.  No leukocytosis, no anemia, no significant electrolyte abnormalities.  CT scan without obvious acute intra-abdominal pathology.  No obvious intraspinal pathology.  Patient feeling a bit better on reassessment.  Will treat as musculoskeletal.  PCP follow-up.  10:51 PM:  I have discussed the diagnosis/risks/treatment options with the patient.  Evaluation and diagnostic testing in the emergency department does not suggest an emergent condition requiring admission or immediate intervention beyond what has been performed at this time.  They will follow up with PCP. We also discussed returning to the ED immediately if new or worsening sx occur. We discussed the sx which are most concerning (e.g., sudden worsening pain, fever, inability to tolerate by mouth, cauda equina s/sx) that necessitate immediate return. Medications administered to the patient during their visit and any new prescriptions provided to the patient are listed below.  Medications given during this visit Medications  HYDROcodone-acetaminophen (NORCO/VICODIN) 5-325 MG per tablet 1 tablet (1 tablet Oral Given 04/16/23 2108)  ondansetron (ZOFRAN-ODT) disintegrating tablet 4 mg (4 mg Oral Given 04/16/23 2105)  ketorolac (TORADOL) 15 MG/ML injection 15 mg (15 mg Intramuscular Given 04/16/23 2105)     The patient appears reasonably screen and/or stabilized for discharge and I doubt any other medical condition or other [facility] requiring further screening, evaluation, or treatment in the ED at this time prior to discharge.          Final Clinical Impression(s)  / ED Diagnoses Final diagnoses:  Acute right-sided low back pain without sciatica  Uncontrolled hypertension    Rx / DC Orders ED Discharge Orders          Ordered    methylPREDNISolone (MEDROL DOSEPAK) 4 MG TBPK tablet        04/16/23 2034    diclofenac Sodium (VOLTAREN) 1 % GEL  4 times daily        04/16/23 2034    methocarbamol (ROBAXIN) 500 MG tablet  2  times daily        04/16/23 2034              Melene Plan, DO 04/16/23 2251

## 2023-04-16 NOTE — Discharge Instructions (Signed)
Your back pain is most likely due to a muscular strain.  There is been a lot of research on back pain, unfortunately the only thing that seems to really help is Tylenol and ibuprofen.  Relative rest is also important to not lift greater than 10 pounds bending or twisting at the waist.  Please follow-up with your family physician.  The other thing that really seems to benefit patients is physical therapy which your doctor may send you for.  Please return to the emergency department for new numbness or weakness to your arms or legs. Difficulty with urinating or urinating or pooping on yourself.  Also if you cannot feel toilet paper when you wipe or get a fever.   Take the steroids as prescribed.  Use the gel as prescribed Also take tylenol '1000mg'$ (2 extra strength) four times a day.

## 2023-07-07 ENCOUNTER — Other Ambulatory Visit (HOSPITAL_COMMUNITY): Payer: Self-pay

## 2023-07-15 ENCOUNTER — Encounter: Payer: Self-pay | Admitting: Physician Assistant

## 2023-07-15 ENCOUNTER — Ambulatory Visit: Admitting: Physician Assistant

## 2023-07-15 VITALS — BP 191/132 | HR 83 | Ht 71.0 in | Wt 255.0 lb

## 2023-07-15 DIAGNOSIS — Z125 Encounter for screening for malignant neoplasm of prostate: Secondary | ICD-10-CM

## 2023-07-15 DIAGNOSIS — I10 Essential (primary) hypertension: Secondary | ICD-10-CM | POA: Diagnosis not present

## 2023-07-15 DIAGNOSIS — I160 Hypertensive urgency: Secondary | ICD-10-CM | POA: Diagnosis not present

## 2023-07-15 DIAGNOSIS — Z1159 Encounter for screening for other viral diseases: Secondary | ICD-10-CM

## 2023-07-15 DIAGNOSIS — Z8739 Personal history of other diseases of the musculoskeletal system and connective tissue: Secondary | ICD-10-CM

## 2023-07-15 DIAGNOSIS — R601 Generalized edema: Secondary | ICD-10-CM

## 2023-07-15 DIAGNOSIS — F1410 Cocaine abuse, uncomplicated: Secondary | ICD-10-CM | POA: Insufficient documentation

## 2023-07-15 DIAGNOSIS — I482 Chronic atrial fibrillation, unspecified: Secondary | ICD-10-CM | POA: Diagnosis not present

## 2023-07-15 DIAGNOSIS — R7303 Prediabetes: Secondary | ICD-10-CM | POA: Insufficient documentation

## 2023-07-15 MED ORDER — EMPAGLIFLOZIN 10 MG PO TABS: 10.0000 mg | ORAL_TABLET | Freq: Every day | ORAL | 1 refills | Status: AC

## 2023-07-15 MED ORDER — CLONIDINE HCL 0.1 MG PO TABS: 0.1000 mg | ORAL_TABLET | Freq: Once | ORAL | Status: AC

## 2023-07-15 MED ORDER — FUROSEMIDE 80 MG PO TABS: 80.0000 mg | ORAL_TABLET | Freq: Two times a day (BID) | ORAL | 1 refills | Status: AC

## 2023-07-15 MED ORDER — CLONIDINE HCL 0.1 MG PO TABS: 0.1000 mg | ORAL_TABLET | Freq: Once | ORAL | Status: DC

## 2023-07-15 MED ORDER — ISOSORB DINITRATE-HYDRALAZINE 20-37.5 MG PO TABS: 2.0000 | ORAL_TABLET | Freq: Three times a day (TID) | ORAL | 0 refills | Status: AC

## 2023-07-15 MED ORDER — CARVEDILOL 3.125 MG PO TABS: 3.1250 mg | ORAL_TABLET | Freq: Two times a day (BID) | ORAL | 1 refills | Status: AC

## 2023-07-15 MED ORDER — ROSUVASTATIN CALCIUM 20 MG PO TABS
20.0000 mg | ORAL_TABLET | Freq: Every day | ORAL | 1 refills | Status: DC
Start: 2023-07-15 — End: 2023-07-16

## 2023-07-15 MED ORDER — APIXABAN 5 MG PO TABS: 5.0000 mg | ORAL_TABLET | Freq: Two times a day (BID) | ORAL | 1 refills | Status: AC

## 2023-07-15 NOTE — Progress Notes (Signed)
 Established Patient Office Visit  Subjective   Patient ID: James [name], male    DOB: [birth year]  Age: 63 y.o. MRN: [id number]  Chief Complaint  Patient presents with   Medication Refill    Patient has been without all medications for over a month    Discussed the use of AI scribe software for clinical note transcription with the patient, who gave verbal consent to proceed.  History of Present Illness   James [name] is a 63 year old male with hypertension and irregular heart rate who presents for medication management and blood pressure control.  He has been out of some of his medications for a month, leading to elevated blood pressure, recorded at 181/121 mmHg. 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  He experiences fluid retention, particularly in his abdomen, causing heaviness, along with swelling in his feet and shortness of breath, especially when walking. He requests more diuretics for management. He has been out of Lasix  for the past month.   He has not been taking amlodipine  due to concerns about foot swelling and has been out of Jardiance  for a month. He continues to take Eliquis .  He never started the carvedilol that was recommended by cardiology last July.  He was last seen on the [facility] in 6/24 and had not followed up with anyone since then.  He acknowledges a history of cocaine use, which he intends to stop due to its potential impact on his heart condition. He declines substance abuse counseling at this time. He states he has not used any cocaine today. He has lost weight recently, from 255 lbs to 225 lbs, attributing some of this to fluid management.  He has not experienced recent episodes of gout and has not been taking allopurinol . He continues to take rosuvastatin  for cholesterol management.  He denies hypertensive symptoms, but does endorse SOB when walking longer distances.       Past Medical History:   Diagnosis Date   A-fib (HCC)    Hypercholesteremia    Hypertension    Social History   Socioeconomic History   Marital status: Legally Separated    Spouse name: Not on file   Number of children: 0   Years of education: Not on file   Highest education level: Not on file  Occupational History   Not on file  Tobacco Use   Smoking status: Never    Passive exposure: Never   Smokeless tobacco: Never  Substance and Sexual Activity   Alcohol use: Not Currently    Alcohol/week: 16.0 standard drinks of alcohol    Types: 16 Shots of liquor per week   Drug use: Yes    Types: Cocaine   Sexual activity: Yes  Other Topics Concern   Not on file  Social History Narrative   Not on file   Social Drivers of Health   Financial Resource Strain: Not on file  Food Insecurity: No Food Insecurity (06/21/2022)   Hunger Vital Sign    Worried About Running Out of Food in the Last Year: Never true    Ran Out of Food in the Last Year: Never true  Transportation Needs: No Transportation Needs (06/21/2022)   PRAPARE - Administrator, Civil Service (Medical): No    Lack of Transportation (Non-Medical): No  Physical Activity: Not on file  Stress: Not on file  Social Connections: Unknown (07/08/2021)   [facility]  Social Network    Social Network: Not on file  Intimate Partner Violence: Not At Risk (06/21/2022)   Humiliation, Afraid, Rape, and Kick questionnaire    Fear of Current or Ex-Partner: No    Emotionally Abused: No    Physically Abused: No    Sexually Abused: No   Family History  Problem Relation Age of Onset   Diabetes Mother    CAD Brother    No Known Allergies  Review of Systems  Constitutional: Negative.   HENT: Negative.    Eyes: Negative.   Respiratory:  Positive for shortness of breath.   Cardiovascular:  Positive for leg swelling. Negative for chest pain and palpitations.  Gastrointestinal: Negative.   Genitourinary:   Positive for frequency.  Musculoskeletal: Negative.   Skin: Negative.   Neurological:  Negative for weakness and headaches.  Endo/Heme/Allergies: Negative.   Psychiatric/Behavioral: Negative.        Objective:     BP (!) 191/132 (BP Location: Left Arm, Patient Position: Sitting, Cuff Size: Large)   Pulse 83   Ht 5\' 11"  (1.803 m)   Wt 255 lb (115.7 kg)   SpO2 92%   BMI 35.57 kg/m  BP Readings from Last 3 Encounters:  07/15/23 (!) 191/132  04/16/23 (!) 175/79  08/06/22 120/73   Wt Readings from Last 3 Encounters:  07/15/23 255 lb (115.7 kg)  08/06/22 265 lb (120.2 kg)  07/22/22 246 lb (111.6 kg)    Physical Exam Vitals and nursing note reviewed.  Constitutional:      Appearance: Normal appearance. He is obese.  HENT:     Head: Normocephalic and atraumatic.     Right Ear: External ear normal.     Left Ear: External ear normal.     Nose: Nose normal.     Mouth/Throat:     Mouth: Mucous membranes are moist.     Pharynx: Oropharynx is clear.  Eyes:     Extraocular Movements: Extraocular movements intact.     Conjunctiva/sclera: Conjunctivae normal.     Pupils: Pupils are equal, round, and reactive to light.  Cardiovascular:     Rate and Rhythm: Rhythm irregular.     Pulses:          Dorsalis pedis pulses are 1+ on the right side and 1+ on the left side.       Posterior tibial pulses are 1+ on the right side and 1+ on the left side.  Pulmonary:     Effort: Pulmonary effort is normal.     Breath sounds: Normal breath sounds.  Musculoskeletal:     Cervical back: Normal range of motion and neck supple.     Right lower leg: 2+ Pitting Edema present.     Left lower leg: 2+ Pitting Edema present.  Skin:    General: Skin is warm and dry.  Neurological:     General: No focal deficit present.     Mental Status: He is alert and oriented to person, place, and time.  Psychiatric:        Mood and Affect: Mood normal.        Behavior: Behavior normal.        Thought  Content: Thought content normal.        Judgment: Judgment normal.        Assessment & Plan:   Problem List Items Addressed This Visit       Cardiovascular and Mediastinum   Essential hypertension   Relevant Medications   carvedilol (COREG)  3.125 MG tablet   isosorbide -hydrALAZINE  (BIDIL ) 20-37.5 MG tablet   rosuvastatin  (CRESTOR ) 20 MG tablet   furosemide  (LASIX ) 80 MG tablet   empagliflozin  (JARDIANCE ) 10 MG TABS tablet   apixaban  (ELIQUIS ) 5 MG TABS tablet   cloNIDine  (CATAPRES ) tablet 0.1 mg   Chronic atrial fibrillation (HCC)   Relevant Medications   carvedilol (COREG) 3.125 MG tablet   isosorbide -hydrALAZINE  (BIDIL ) 20-37.5 MG tablet   rosuvastatin  (CRESTOR ) 20 MG tablet   furosemide  (LASIX ) 80 MG tablet   empagliflozin  (JARDIANCE ) 10 MG TABS tablet   apixaban  (ELIQUIS ) 5 MG TABS tablet   cloNIDine  (CATAPRES ) tablet 0.1 mg   Other Relevant Orders   CBC with Differential/Platelet   Comp. Metabolic Panel (12)   Lipid panel   TSH   Ambulatory referral to Cardiology   Hypertensive urgency - Primary   Relevant Medications   carvedilol (COREG) 3.125 MG tablet   isosorbide -hydrALAZINE  (BIDIL ) 20-37.5 MG tablet   rosuvastatin  (CRESTOR ) 20 MG tablet   furosemide  (LASIX ) 80 MG tablet   apixaban  (ELIQUIS ) 5 MG TABS tablet   cloNIDine  (CATAPRES ) tablet 0.1 mg     Other   Generalized edema   History of gout   Prediabetes   Relevant Orders   POCT glycosylated hemoglobin (Hb A1C)   Hemoglobin A1c   Cocaine abuse (HCC)   Other Visit Diagnoses       Screening PSA (prostate specific antigen)       Relevant Orders   PSA     Encounter for HCV screening test for low risk patient       Relevant Orders   HCV Ab w Reflex to Quant PCR     1. Essential hypertension Poorly controlled due to medication non-compliance  Cardiologist recommended low-dose carvedilol.  History of bradycardia with beta blockers necessitates careful monitoring. Clonidine  given in clinic  for  acute hypertension management with informed consent. Updated medication list and gave patient written directions for taking his medications. Urgent referral to cardiology - does not want to return to [facility] at this time. He will f/u with [facility] in 2-3 weeks  Red flags given for prompt reevaiuation. - isosorbide -hydrALAZINE  (BIDIL ) 20-37.5 MG tablet; Take 2 tablets by mouth 3 (three) times daily.  Dispense: 180 tablet; Refill: 0 - empagliflozin  (JARDIANCE ) 10 MG TABS tablet; Take 1 tablet (10 mg total) by mouth daily.  Dispense: 30 tablet; Refill: 1  2. Hypertensive urgency (Primary)  - cloNIDine  (CATAPRES ) tablet 0.1 mg  3. Chronic atrial fibrillation (HCC)  - CBC with Differential/Platelet - Comp. Metabolic Panel (12) - Lipid panel - TSH - Ambulatory referral to Cardiology - carvedilol (COREG) 3.125 MG tablet; Take 1 tablet (3.125 mg total) by mouth 2 (two) times daily with a meal.  Dispense: 60 tablet; Refill: 1 - rosuvastatin  (CRESTOR ) 20 MG tablet; Take 1 tablet (20 mg total) by mouth daily.  Dispense: 30 tablet; Refill: 1 - furosemide  (LASIX ) 80 MG tablet; Take 1 tablet (80 mg total) by mouth 2 (two) times daily.  Dispense: 60 tablet; Refill: 1 - empagliflozin  (JARDIANCE ) 10 MG TABS tablet; Take 1 tablet (10 mg total) by mouth daily.  Dispense: 30 tablet; Refill: 1 - apixaban  (ELIQUIS ) 5 MG TABS tablet; Take 1 tablet (5 mg total) by mouth 2 (two) times daily.  Dispense: 60 tablet; Refill: 1  4. Generalized edema   5. Prediabetes POCT unavailable  - ordered send off - POCT glycosylated hemoglobin (Hb A1C) - Hemoglobin A1c  6. Cocaine abuse (HCC) Cocaine use poses significant  cardiac risk, especially with heart failure and atrial fibrillation. Informed of increased heart rate and risk of fatal cardiac events. - Counsel on cocaine use risks in context of cardiac conditions. - Encourage cessation and offer support for substance use cessation.  Declines substance abuse treatment    7. History of gout Hold allopurinol  at this time until GFR is known, no recent gout attacks per patient  8. Screening PSA (prostate specific antigen)  - PSA  9. Encounter for HCV screening test for low risk patient  - HCV Ab w Reflex to Quant PCR   I have reviewed the patient's medical history (PMH, PSH, Social History, Family History, Medications, and allergies) , and have been updated if relevant. I spent 50 minutes reviewing chart and  face to face time with patient.  No AVS created, patient does not have access to mychart at this time, no access to printer on screening James [name], patient education given through teach back method.    Return in about 2 weeks (around 07/29/2023) for With [facility].    James Hermann Mayers, PA-C

## 2023-07-16 ENCOUNTER — Telehealth: Payer: Self-pay

## 2023-07-16 ENCOUNTER — Ambulatory Visit: Payer: Self-pay | Admitting: Physician Assistant

## 2023-07-16 DIAGNOSIS — E782 Mixed hyperlipidemia: Secondary | ICD-10-CM

## 2023-07-16 LAB — CBC WITH DIFFERENTIAL/PLATELET
Basophils Absolute: 0 10*3/uL (ref 0.0–0.2)
Basos: 1 %
EOS (ABSOLUTE): 0.1 10*3/uL (ref 0.0–0.4)
Eos: 1 %
Hematocrit: 39.6 % (ref 37.5–51.0)
Hemoglobin: 12.4 g/dL — ABNORMAL LOW (ref 13.0–17.7)
Immature Grans (Abs): 0 10*3/uL (ref 0.0–0.1)
Immature Granulocytes: 0 %
Lymphocytes Absolute: 1.4 10*3/uL (ref 0.7–3.1)
Lymphs: 33 %
MCH: 27.5 pg (ref 26.6–33.0)
MCHC: 31.3 g/dL — ABNORMAL LOW (ref 31.5–35.7)
MCV: 88 fL (ref 79–97)
Monocytes Absolute: 0.5 10*3/uL (ref 0.1–0.9)
Monocytes: 12 %
Neutrophils Absolute: 2.2 10*3/uL (ref 1.4–7.0)
Neutrophils: 53 %
Platelets: 152 10*3/uL (ref 150–450)
RBC: 4.51 x10E6/uL (ref 4.14–5.80)
RDW: 15.9 % — ABNORMAL HIGH (ref 11.6–15.4)
WBC: 4.3 10*3/uL (ref 3.4–10.8)

## 2023-07-16 LAB — COMP. METABOLIC PANEL (12)
AST: 20 IU/L (ref 0–40)
Albumin: 4.1 g/dL (ref 3.9–4.9)
Alkaline Phosphatase: 81 IU/L (ref 44–121)
BUN/Creatinine Ratio: 10 (ref 10–24)
BUN: 11 mg/dL (ref 8–27)
Bilirubin Total: 1.9 mg/dL — ABNORMAL HIGH (ref 0.0–1.2)
Calcium: 8.9 mg/dL (ref 8.6–10.2)
Chloride: 102 mmol/L (ref 96–106)
Creatinine, Ser: 1.08 mg/dL (ref 0.76–1.27)
Globulin, Total: 3.5 g/dL (ref 1.5–4.5)
Glucose: 87 mg/dL (ref 70–99)
Potassium: 4 mmol/L (ref 3.5–5.2)
Sodium: 136 mmol/L (ref 134–144)
Total Protein: 7.6 g/dL (ref 6.0–8.5)
eGFR: 77 mL/min/{1.73_m2} (ref >59)

## 2023-07-16 LAB — HCV INTERPRETATION

## 2023-07-16 LAB — LIPID PANEL
Chol/HDL Ratio: 4.4 ratio (ref 0.0–5.0)
Cholesterol, Total: 164 mg/dL (ref 100–199)
HDL: 37 mg/dL — ABNORMAL LOW (ref >39)
LDL Chol Calc (NIH): 114 mg/dL — ABNORMAL HIGH (ref 0–99)
Triglycerides: 68 mg/dL (ref 0–149)
VLDL Cholesterol Cal: 13 mg/dL (ref 5–40)

## 2023-07-16 LAB — PSA: Prostate Specific Ag, Serum: 0.5 ng/mL (ref 0.0–4.0)

## 2023-07-16 LAB — HEMOGLOBIN A1C
Est. average glucose Bld gHb Est-mCnc: 117 mg/dL
Hgb A1c MFr Bld: 5.7 % — ABNORMAL HIGH (ref 4.8–5.6)

## 2023-07-16 LAB — HCV AB W REFLEX TO QUANT PCR: HCV Ab: NONREACTIVE

## 2023-07-16 LAB — TSH: TSH: 2.41 u[IU]/mL (ref 0.450–4.500)

## 2023-07-16 MED ORDER — ROSUVASTATIN CALCIUM 40 MG PO TABS: 40.0000 mg | ORAL_TABLET | Freq: Every day | ORAL | 1 refills | Status: AC

## 2023-07-16 NOTE — Telephone Encounter (Signed)
 Prior Authorization attempted for medication Isosorbide - hydralazine  20-37.5, Insurance has preferred brand be administered by pharmacy [facility] .  Call reference number: [id number]   Pharmacy contacted, medication currently not instock but will be ordered and ready for patient to pick up Tuesday 5.27.2025  Unable to contact patient or leave a VM

## 2023-09-13 ENCOUNTER — Encounter (HOSPITAL_COMMUNITY): Payer: Self-pay | Admitting: *Deleted

## 2023-09-13 ENCOUNTER — Other Ambulatory Visit: Payer: Self-pay

## 2023-09-13 ENCOUNTER — Ambulatory Visit (HOSPITAL_COMMUNITY)
Admission: EM | Admit: 2023-09-13 | Discharge: 2023-09-13 | Disposition: A | Discharge status: Home / Self Care | Attending: Emergency Medicine | Admitting: Emergency Medicine

## 2023-09-13 DIAGNOSIS — R21 Rash and other nonspecific skin eruption: Secondary | ICD-10-CM | POA: Diagnosis not present

## 2023-09-13 DIAGNOSIS — I10 Essential (primary) hypertension: Secondary | ICD-10-CM | POA: Diagnosis not present

## 2023-09-13 MED ORDER — TRIAMCINOLONE ACETONIDE 40 MG/ML IJ SUSP
40.0000 mg | Freq: Once | INTRAMUSCULAR | Status: AC
  Administered 2023-09-13: 40 mg via INTRAMUSCULAR

## 2023-09-13 MED ORDER — DIPHENHYDRAMINE HCL 25 MG PO TABS
25.0000 mg | ORAL_TABLET | Freq: Four times a day (QID) | ORAL | 0 refills | Status: AC | PRN
Start: 2023-09-13 — End: ?

## 2023-09-13 MED ORDER — PREDNISONE 20 MG PO TABS: 40.0000 mg | ORAL_TABLET | Freq: Every day | ORAL | 0 refills | Status: AC

## 2023-09-13 MED ORDER — TRIAMCINOLONE ACETONIDE 40 MG/ML IJ SUSP
INTRAMUSCULAR | Status: AC
Start: 2023-09-13 — End: 2023-09-13
  Filled 2023-09-13: qty 1

## 2023-09-13 NOTE — ED Provider Notes (Addendum)
 [facility]    CSN: [id number] Arrival date & time: 09/13/23  1203      History   Chief Complaint Chief Complaint  Patient presents with   Skin Problem    HPI James [name] is a 63 y.o. male.   Patient presents to clinic over concern of an itchy rash that has been present for the past month or so.  Rash is worse on his left arm but will pop up on his left chest, back, lower abdominal area, inner thighs and knees.  Reports rash pops up after he itches.  Rash is worse in the heat, he does work outside.  Did try topical hydrocortisone without much relief.  Unsure of exposure to any irritants.  Reports he has used cheap motel soap recently.  Has not taken his blood pressure medication today, usually takes it every morning.  Reports he was itching too hard and forgot.  Has not had any vision changes or headache.  The history is provided by the patient and medical records.    Past Medical History:  Diagnosis Date   A-fib [facility])    Hypercholesteremia    Hypertension     Patient Active Problem List   Diagnosis Date Noted   Prediabetes 07/15/2023   Cocaine abuse (HCC) 07/15/2023   Generalized edema 08/06/2022   History of gout 08/06/2022   Acute gout of right ankle 06/25/2022   Persistent atrial fibrillation (HCC) 06/24/2022   Acute kidney injury superimposed on chronic kidney disease (HCC) 06/21/2022   Acute on chronic diastolic CHF (congestive heart failure) (HCC) 06/21/2022   Obesity (BMI 30-39.9) 06/21/2022   Cocaine use 06/21/2022   Snoring 12/10/2018   Acute pulmonary edema (HCC) 02/12/2018   Chronic atrial fibrillation (HCC) 02/12/2018   Hypertensive urgency 02/12/2018   Essential hypertension 10/04/2014    Past Surgical History:  Procedure Laterality Date   CARDIOVERSION N/A 06/24/2022   Procedure: CARDIOVERSION;  Surgeon: Elmira Newman PARAS, MD;  [facility];  Service: Cardiovascular;  Laterality: N/A;   LEFT HEART CATH AND  CORONARY ANGIOGRAPHY N/A 02/12/2018   Procedure: LEFT HEART CATH AND CORONARY ANGIOGRAPHY;  Surgeon: Elmira Newman PARAS, MD;  [facility];  Service: Cardiovascular;  Laterality: N/A;   TEE WITHOUT CARDIOVERSION N/A 06/24/2022   Procedure: TRANSESOPHAGEAL ECHOCARDIOGRAM;  Surgeon: Elmira Newman PARAS, MD;  [facility];  Service: Cardiovascular;  Laterality: N/A;       Home Medications    Prior to Admission medications   Medication Sig Start Date End Date Taking? Authorizing Provider  apixaban  (ELIQUIS ) 5 MG TABS tablet Take 1 tablet (5 mg total) by mouth 2 (two) times daily. 07/15/23  Yes Mayers, Cari S, PA-C  carvedilol  (COREG ) 3.125 MG tablet Take 1 tablet (3.125 mg total) by mouth 2 (two) times daily with a meal. 07/15/23 07/14/24 Yes Mayers, Cari S, PA-C  diphenhydrAMINE  (BENADRYL ) 25 MG tablet Take 1 tablet (25 mg total) by mouth every 6 (six) hours as needed. 09/13/23  Yes Cadin Luka  N, FNP  furosemide  (LASIX ) 80 MG tablet Take 1 tablet (80 mg total) by mouth 2 (two) times daily. 07/15/23  Yes Mayers, Cari S, PA-C  predniSONE  (DELTASONE ) 20 MG tablet Take 2 tablets (40 mg total) by mouth daily for 5 days. 09/13/23 09/18/23 Yes Mardell Suttles  N, FNP  rosuvastatin  (CRESTOR ) 40 MG tablet Take 1 tablet (40 mg total) by mouth daily. 07/16/23  Yes Mayers, Cari S, PA-C  allopurinol  (ZYLOPRIM ) 100  MG tablet Take 1 tablet (100 mg total) by mouth daily. Patient not taking: Reported on 07/15/2023 08/07/22   Mayers, Kirk RAMAN, PA-C  empagliflozin  (JARDIANCE ) 10 MG TABS tablet Take 1 tablet (10 mg total) by mouth daily. 07/15/23   Mayers, Kirk RAMAN, PA-C    Family History Family History  Problem Relation Age of Onset   Diabetes Mother    CAD Brother     Social History Social History   Tobacco Use   Smoking status: Never    Passive exposure: Never   Smokeless tobacco: Never  Substance Use Topics   Alcohol use: Not Currently    Alcohol/week: 16.0 standard  drinks of alcohol    Types: 16 Shots of liquor per week   Drug use: Yes    Types: Cocaine     Allergies   Patient has no known allergies.   Review of Systems Review of Systems  Per HPI  Physical Exam Triage Vital Signs ED Triage Vitals  Encounter Vitals Group     BP 09/13/23 1228 (!) 205/83     Girls Systolic BP Percentile --      Girls Diastolic BP Percentile --      Boys Systolic BP Percentile --      Boys Diastolic BP Percentile --      Pulse Rate 09/13/23 1228 (!) 57     Resp 09/13/23 1228 20     Temp 09/13/23 1228 98.1 F (36.7 C)     Temp src --      SpO2 09/13/23 1228 98 %     Weight --      Height --      Head Circumference --      Peak Flow --      Pain Score 09/13/23 1226 0     Pain Loc --      Pain Education --      Exclude from Growth Chart --    No data found.  Updated Vital Signs BP (!) 205/83   Pulse (!) 57   Temp 98.1 F (36.7 C)   Resp 20   SpO2 98%   Visual Acuity Right Eye Distance:   Left Eye Distance:   Bilateral Distance:    Right Eye Near:   Left Eye Near:    Bilateral Near:     Physical Exam Vitals and nursing note reviewed.  Constitutional:      Appearance: Normal appearance.  HENT:     Head: Normocephalic and atraumatic.     Right Ear: External ear normal.     Left Ear: External ear normal.     Nose: Nose normal.     Mouth/Throat:     Mouth: Mucous membranes are moist.  Cardiovascular:     Rate and Rhythm: Normal rate.  Pulmonary:     Effort: Pulmonary effort is normal. No respiratory distress.  Musculoskeletal:        General: Normal range of motion.  Skin:    General: Skin is warm and dry.      Neurological:     General: No focal deficit present.     Mental Status: He is alert and oriented to person, place, and time.  Psychiatric:        Mood and Affect: Mood normal.        Behavior: Behavior normal. Behavior is cooperative.      UC Treatments / Results  Labs (all labs ordered are listed, but only  abnormal results are displayed) Labs Reviewed -  No data to display  EKG   Radiology No results found.  Procedures Procedures (including critical care time)  Medications Ordered in UC Medications  triamcinolone  acetonide (KENALOG -40) injection 40 mg (40 mg Intramuscular Given 09/13/23 1256)    Initial Impression / Assessment and Plan / UC Course  I have reviewed the triage vital signs and the nursing notes.  Pertinent labs & imaging results that were available during my care of the patient were reviewed by me and considered in my medical decision making (see chart for details).  Vitals and triage reviewed, patient is hemodynamically stable.  Erythematous and urticarial rash to the left arm with reported rash to chest, knees, legs, groin and thighs.  Ongoing for the past month and unresponsive to topical therapy.  Will give IM steroid in clinic and sent home on oral steroids, antihistamine as needed.  Patient is hypertensive in clinic, advised importance of blood pressure medication compliance.  Without signs or symptoms of hypertensive urgency or emergency at this time.  Discussed risk of A-fib with steroid use, encourage medication compliance.  Plan of care, follow-up care return precautions given, no questions at this time.     Final Clinical Impressions(s) / UC Diagnoses   Final diagnoses:  Rash and nonspecific skin eruption  Essential hypertension     Discharge Instructions      Start taking the oral steroids tomorrow with breakfast.  He can take 25 mg of Benadryl  every 6 hours as needed for itching, this may cause drowsiness or sedation.  Ensure you are using unscented and hypoallergenic soaps and detergents to help avoid any further skin irritation.  Take your blood pressure medication as prescribed.  Your blood pressure was quite high today in clinic.  Seek immediate care at the nearest emergency department if you develop any headache, vision loss or vision  changes.  Follow-up with your primary care provider if your rash persists.      ED Prescriptions     Medication Sig Dispense Auth. Provider   predniSONE  (DELTASONE ) 20 MG tablet Take 2 tablets (40 mg total) by mouth daily for 5 days. 10 tablet Dreama, Miciah Shealy  N, FNP   diphenhydrAMINE  (BENADRYL ) 25 MG tablet Take 1 tablet (25 mg total) by mouth every 6 (six) hours as needed. 30 tablet Dreama, Javonni Macke  N, FNP      PDMP not reviewed this encounter.   Dreama, Keeva Reisen  N, FNP 09/13/23 1304    Dreama, Leigh Kaeding  N, FNP 09/13/23 [phone]

## 2023-09-13 NOTE — ED Triage Notes (Signed)
 PT reports he has been working in the sun and has rash that itches on his arms.

## 2023-09-13 NOTE — Discharge Instructions (Addendum)
 Start taking the oral steroids tomorrow with breakfast.  He can take 25 mg of Benadryl  every 6 hours as needed for itching, this may cause drowsiness or sedation.  Ensure you are using unscented and hypoallergenic soaps and detergents to help avoid any further skin irritation.  Take your blood pressure medication as prescribed.  Your blood pressure was quite high today in clinic.  Seek immediate care at the nearest emergency department if you develop any headache, vision loss or vision changes.  Follow-up with your primary care provider if your rash persists.

## 2023-09-22 ENCOUNTER — Other Ambulatory Visit (HOSPITAL_COMMUNITY): Payer: Self-pay

## 2023-11-24 ENCOUNTER — Other Ambulatory Visit: Payer: Self-pay | Admitting: Physician Assistant

## 2023-11-24 DIAGNOSIS — I482 Chronic atrial fibrillation, unspecified: Secondary | ICD-10-CM

## 2023-11-24 NOTE — Telephone Encounter (Unsigned)
 Copied from CRM #[id number]. Topic: Clinical - Medication Refill >> Nov 24, 2023  2:20 PM Joesph B wrote: Medication: apixaban  (ELIQUIS ) 5 MG TABS tablet  carvedilol  (COREG ) 3.125 MG tablet  furosemide  (LASIX ) 80 MG tablet   Has the patient contacted their pharmacy? No (Agent: If no, request that the patient contact the pharmacy for the refill. If patient does not wish to contact the pharmacy document the reason why and proceed with request.) (Agent: If yes, when and what did the pharmacy advise?)  This is the patient's preferred pharmacy:  [facility] [address] ([address] [place] - [address] [place] ([place]) [place] [address] Phone: [phone] Fax: [phone]   Is this the correct pharmacy for this prescription? Yes If no, delete pharmacy and type the correct one.   Has the prescription been filled recently? No  Is the patient out of the medication? Yes  Has the patient been seen for an appointment in the last year OR does the patient have an upcoming appointment? Yes  Can we respond through MyChart? Yes  Agent: Please be advised that Rx refills may take up to 3 business days. We ask that you follow-up with your pharmacy.

## 2023-11-24 NOTE — Telephone Encounter (Signed)
 Not a pt at our office, [facility].

## 2023-12-04 ENCOUNTER — Telehealth: Payer: Self-pay | Admitting: Physician Assistant

## 2023-12-04 ENCOUNTER — Ambulatory Visit: Payer: Self-pay | Admitting: Physician Assistant

## 2023-12-04 NOTE — Telephone Encounter (Unsigned)
 Copied from CRM [id number]. Topic: Clinical - Medication Refill >> Dec 04, 2023  2:56 PM Suzette B wrote: Medication: diphenhydrAMINE  (BENADRYL ) 25 MG tablet  Has the patient contacted their pharmacy? No (Agent: If no, request that the patient contact the pharmacy for the refill. If patient does not wish to contact the pharmacy document the reason why and proceed with request.) (Agent: If yes, when and what did the pharmacy advise?)  This is the patient's preferred pharmacy:  [facility] [address] ([address]  - [address] [place] ([place]) [place] [address] Phone: [phone] Fax: [phone]   Is this the correct pharmacy for this prescription? Yes If no, delete pharmacy and type the correct one.   Has the prescription been filled recently? No 09/18/23  Is the patient out of the medication? Yes  Has the patient been seen for an appointment in the last year OR does the patient have an upcoming appointment? No  Can we respond through MyChart? Yes  Agent: Please be advised that Rx refills may take up to 3 business days. We ask that you follow-up with your pharmacy.

## 2023-12-04 NOTE — Telephone Encounter (Signed)
  FYI Only or Action Required?: Action required by provider: medication refill request.  Patient was last seen in primary care on 07/15/2023 by James [name], James S, PA-C.  Called Nurse Triage reporting Pruritis.  Symptoms began several weeks ago.  Interventions attempted: Nothing.  Symptoms are: unchanged.  Triage Disposition: See PCP Within 2 Weeks  Patient/caregiver understands and will follow disposition?: Yes     Patient states he has been itching really bad, stated the last time he was experiencing this he was prescribed some medication. Located the prescription filled on 07/25 for benadryl . [phone]  Brashear, James [name] SB   12/04/23  2:58 PM Unsigned Note Copied from CRM [id number]. Topic: Clinical - Medication Refill >> Dec 04, 2023  2:56 PM James B wrote: Medication: diphenhydrAMINE  (BENADRYL ) 25 MG tablet   Has the patient contacted their pharmacy? No (Agent: If no, request that the patient contact the pharmacy for the refill. If patient does not wish to contact the pharmacy document the reason why and proceed with request.) (Agent: If yes, when and what did the pharmacy advise?)   This is the patient'[name] preferred pharmacy:  [facility] [address] ([address] [place] - [address] [place] ([place]) [place] [address] Phone: [phone] Fax: [phone]     Is this the correct pharmacy for this prescription? Yes If no, delete pharmacy and type the correct one.    Has the prescription been filled recently? No 09/18/23   Is the patient out of the medication? Yes   Has the patient been seen for an appointment in the last year OR does the patient have an upcoming appointment? No   Can we respond through MyChart? Yes   Agent: Please be advised that Rx refills may take up to 3 business days. We ask that you follow-up with your pharmacy.    Reason for Disposition  Itching is a chronic symptom (recurrent or ongoing AND present > 4  weeks)  Answer Assessment - Initial Assessment Questions 1. DESCRIPTION: Describe the itching you are having.     Generalized itching  2. SEVERITY: How bad is it?      Waking each morning  3. SCRATCHING: Are there any scratch marks? Bleeding?     yes 4. ONSET: When did this begin? ([name].g., minutes, hours, days ago)      Ongoing  5. CAUSE: What do you think is causing the itching? (ask about swimming pools, pollen, animals, soaps, etc.)     Unsure, environmental  6. OTHER SYMPTOMS: Do you have any other symptoms? ([name].g., fever, rash)     Denies  7. PREGNANCY: Is there any chance you are pregnant? When was your last menstrual period?  Protocols used: Itching - Widespread-A-AH

## 2023-12-09 ENCOUNTER — Ambulatory Visit: Payer: Self-pay

## 2023-12-09 ENCOUNTER — Telehealth: Payer: Self-pay | Admitting: Physician Assistant

## 2023-12-09 NOTE — Telephone Encounter (Unsigned)
 Copied from CRM #[id number]. Topic: Clinical - Medication Refill >> Dec 09, 2023 11:42 AM Myrick T wrote: Medication: diphenhydrAMINE  (BENADRYL ) 25 MG tablet   Has the patient contacted their pharmacy? Yes  This is the patient's preferred pharmacy:  [facility] [address] ([address] [place] - [address] [place] ([place]) [place] [address] Phone: [phone] Fax: [phone]  Is this the correct pharmacy for this prescription? Yes  Has the prescription been filled recently? Yes  Is the patient out of the medication? Yes  Has the patient been seen for an appointment in the last year OR does the patient have an upcoming appointment? No  Can we respond through MyChart? No  Agent: Please be advised that Rx refills may take up to 3 business days. We ask that you follow-up with your pharmacy.

## 2023-12-09 NOTE — Telephone Encounter (Signed)
 FYI Only or Action Required?: FYI only for provider.  Patient was last seen in primary care on 07/15/2023 by Mayers, Kirk RAMAN, PA-C.  Called Nurse Triage reporting Pruritis.  Symptoms began several months ago.  Interventions attempted: OTC medications: hydrocortisone and Prescription medications: benadryl .  Symptoms are: gradually worsening.  Triage Disposition: See PCP Within 2 Weeks  Patient/caregiver understands and will follow disposition?: Yes Pt says he will go back to [facility] and see Cari PA  Copied from CRM #[id number]. Topic: Clinical - Red Word Triage >> Dec 09, 2023 11:45 AM Myrick T wrote: [facility] that prompted transfer to Nurse Triage: patient says he started itching all over and when he scratched he has bumps to come up and he does not know what it is coming from Reason for Disposition  Itching is a chronic symptom (recurrent or ongoing AND present > 4 weeks)  Answer Assessment - Initial Assessment Questions 1. DESCRIPTION: Describe the itching you are having.     Skin feels really dry from being the sun and now having itching episode  2. SEVERITY: How bad is it?      Moderate  3. SCRATCHING: Are there any scratch marks? Bleeding?     Sometimes scratches too hard and has marks  4. ONSET: When did this begin? (e.g., minutes, hours, days ago)      Has been going over for approx 3 months  5. CAUSE: What do you think is causing the itching? (ask about swimming pools, pollen, animals, soaps, etc.)     Unsure of cause. Had changed soaps and thinks it may be drying his out, but unsure of any other cause  6. OTHER SYMPTOMS: Do you have any other symptoms? (e.g., fever, rash)     Notices bumps on skin after scratching too hard  Protocols used: Itching - [facility]

## 2023-12-10 NOTE — Telephone Encounter (Signed)
Called patient unable to make contact or leave voicemail  due to mailbox being full
